# Patient Record
Sex: Male | Born: 1970 | Race: Black or African American | Hispanic: No | Marital: Married | Smoking: Never smoker
Health system: Southern US, Community
[De-identification: ages and names within clinical notes are randomized; demographics above are authoritative.]

## PROBLEM LIST (undated history)

## (undated) DIAGNOSIS — I639 Cerebral infarction, unspecified: Secondary | ICD-10-CM

## (undated) DIAGNOSIS — J45909 Unspecified asthma, uncomplicated: Secondary | ICD-10-CM

## (undated) DIAGNOSIS — G43909 Migraine, unspecified, not intractable, without status migrainosus: Secondary | ICD-10-CM

## (undated) DIAGNOSIS — B019 Varicella without complication: Secondary | ICD-10-CM

## (undated) DIAGNOSIS — J302 Other seasonal allergic rhinitis: Secondary | ICD-10-CM

## (undated) HISTORY — DX: Varicella without complication: B01.9

## (undated) HISTORY — DX: Migraine, unspecified, not intractable, without status migrainosus: G43.909

## (undated) HISTORY — DX: Other seasonal allergic rhinitis: J30.2

---

## 1998-05-16 ENCOUNTER — Emergency Department (HOSPITAL_COMMUNITY): Admission: EM | Admit: 1998-05-16 | Discharge: 1998-05-16 | Payer: Self-pay | Admitting: Emergency Medicine

## 2000-11-08 ENCOUNTER — Emergency Department (HOSPITAL_COMMUNITY): Admission: EM | Admit: 2000-11-08 | Discharge: 2000-11-08 | Payer: Self-pay | Admitting: Emergency Medicine

## 2000-11-09 ENCOUNTER — Ambulatory Visit (HOSPITAL_BASED_OUTPATIENT_CLINIC_OR_DEPARTMENT_OTHER): Admission: RE | Admit: 2000-11-09 | Discharge: 2000-11-09 | Payer: Self-pay | Admitting: *Deleted

## 2001-05-06 ENCOUNTER — Emergency Department (HOSPITAL_COMMUNITY): Admission: EM | Admit: 2001-05-06 | Discharge: 2001-05-06 | Payer: Self-pay | Admitting: *Deleted

## 2001-05-06 ENCOUNTER — Encounter: Payer: Self-pay | Admitting: *Deleted

## 2001-06-22 ENCOUNTER — Emergency Department (HOSPITAL_COMMUNITY): Admission: EM | Admit: 2001-06-22 | Discharge: 2001-06-22 | Payer: Self-pay | Admitting: Emergency Medicine

## 2001-07-20 ENCOUNTER — Emergency Department (HOSPITAL_COMMUNITY): Admission: EM | Admit: 2001-07-20 | Discharge: 2001-07-20 | Payer: Self-pay | Admitting: Emergency Medicine

## 2002-02-09 ENCOUNTER — Emergency Department (HOSPITAL_COMMUNITY): Admission: EM | Admit: 2002-02-09 | Discharge: 2002-02-09 | Payer: Self-pay | Admitting: Emergency Medicine

## 2002-02-09 ENCOUNTER — Encounter: Payer: Self-pay | Admitting: Emergency Medicine

## 2002-03-23 ENCOUNTER — Emergency Department (HOSPITAL_COMMUNITY): Admission: EM | Admit: 2002-03-23 | Discharge: 2002-03-23 | Payer: Self-pay | Admitting: Emergency Medicine

## 2002-03-23 ENCOUNTER — Encounter: Payer: Self-pay | Admitting: Emergency Medicine

## 2002-07-22 ENCOUNTER — Encounter: Payer: Self-pay | Admitting: Emergency Medicine

## 2002-07-22 ENCOUNTER — Emergency Department (HOSPITAL_COMMUNITY): Admission: EM | Admit: 2002-07-22 | Discharge: 2002-07-23 | Payer: Self-pay | Admitting: Emergency Medicine

## 2002-08-17 ENCOUNTER — Emergency Department (HOSPITAL_COMMUNITY): Admission: EM | Admit: 2002-08-17 | Discharge: 2002-08-17 | Payer: Self-pay | Admitting: Emergency Medicine

## 2002-09-02 ENCOUNTER — Emergency Department (HOSPITAL_COMMUNITY): Admission: EM | Admit: 2002-09-02 | Discharge: 2002-09-02 | Payer: Self-pay | Admitting: Emergency Medicine

## 2003-08-22 ENCOUNTER — Emergency Department (HOSPITAL_COMMUNITY): Admission: EM | Admit: 2003-08-22 | Discharge: 2003-08-22 | Payer: Self-pay | Admitting: Family Medicine

## 2003-11-22 ENCOUNTER — Emergency Department (HOSPITAL_COMMUNITY): Admission: EM | Admit: 2003-11-22 | Discharge: 2003-11-22 | Payer: Self-pay | Admitting: Emergency Medicine

## 2004-05-28 ENCOUNTER — Emergency Department (HOSPITAL_COMMUNITY): Admission: EM | Admit: 2004-05-28 | Discharge: 2004-05-28 | Payer: Self-pay | Admitting: Emergency Medicine

## 2004-11-06 ENCOUNTER — Emergency Department (HOSPITAL_COMMUNITY): Admission: EM | Admit: 2004-11-06 | Discharge: 2004-11-06 | Payer: Self-pay | Admitting: Family Medicine

## 2004-12-02 ENCOUNTER — Encounter: Admission: RE | Admit: 2004-12-02 | Discharge: 2004-12-02 | Payer: Self-pay | Admitting: Occupational Medicine

## 2005-08-23 ENCOUNTER — Emergency Department (HOSPITAL_COMMUNITY): Admission: EM | Admit: 2005-08-23 | Discharge: 2005-08-23 | Payer: Self-pay | Admitting: Family Medicine

## 2005-08-30 ENCOUNTER — Emergency Department (HOSPITAL_COMMUNITY): Admission: EM | Admit: 2005-08-30 | Discharge: 2005-08-30 | Payer: Self-pay | Admitting: Emergency Medicine

## 2006-01-14 ENCOUNTER — Emergency Department (HOSPITAL_COMMUNITY): Admission: EM | Admit: 2006-01-14 | Discharge: 2006-01-14 | Payer: Self-pay | Admitting: Emergency Medicine

## 2006-04-15 ENCOUNTER — Emergency Department (HOSPITAL_COMMUNITY): Admission: EM | Admit: 2006-04-15 | Discharge: 2006-04-15 | Payer: Self-pay | Admitting: *Deleted

## 2006-06-22 ENCOUNTER — Emergency Department (HOSPITAL_COMMUNITY): Admission: EM | Admit: 2006-06-22 | Discharge: 2006-06-22 | Payer: Self-pay | Admitting: Emergency Medicine

## 2006-10-11 ENCOUNTER — Ambulatory Visit: Payer: Self-pay | Admitting: Internal Medicine

## 2006-10-12 ENCOUNTER — Ambulatory Visit: Payer: Self-pay | Admitting: Internal Medicine

## 2006-12-04 ENCOUNTER — Emergency Department (HOSPITAL_COMMUNITY): Admission: EM | Admit: 2006-12-04 | Discharge: 2006-12-04 | Payer: Self-pay | Admitting: Emergency Medicine

## 2006-12-18 ENCOUNTER — Emergency Department (HOSPITAL_COMMUNITY): Admission: EM | Admit: 2006-12-18 | Discharge: 2006-12-18 | Payer: Self-pay | Admitting: Family Medicine

## 2007-02-11 ENCOUNTER — Inpatient Hospital Stay (HOSPITAL_COMMUNITY): Admission: EM | Admit: 2007-02-11 | Discharge: 2007-02-12 | Payer: Self-pay | Admitting: Emergency Medicine

## 2007-02-24 ENCOUNTER — Ambulatory Visit: Payer: Self-pay | Admitting: Internal Medicine

## 2007-02-24 LAB — CONVERTED CEMR LAB
Basophils Absolute: 0.1 10*3/uL (ref 0.0–0.1)
Basophils Relative: 1 % (ref 0–1)
Bilirubin, Direct: 0.3 mg/dL (ref 0.0–0.3)
Eosinophils Absolute: 0.1 10*3/uL (ref 0.0–0.7)
HCT: 42 % (ref 39.0–52.0)
Lymphs Abs: 1.2 10*3/uL (ref 0.7–3.3)
MCHC: 31.9 g/dL (ref 30.0–36.0)
MCV: 94.4 fL (ref 78.0–100.0)
Neutrophils Relative %: 51 % (ref 43–77)
Platelets: 243 10*3/uL (ref 150–400)
RDW: 12.7 % (ref 11.5–14.0)
Total Bilirubin: 1.7 mg/dL — ABNORMAL HIGH (ref 0.3–1.2)
Total Protein: 6.9 g/dL (ref 6.0–8.3)

## 2007-03-02 ENCOUNTER — Encounter (INDEPENDENT_AMBULATORY_CARE_PROVIDER_SITE_OTHER): Payer: Self-pay | Admitting: Internal Medicine

## 2007-03-02 LAB — CONVERTED CEMR LAB
HCV Ab: NEGATIVE
Hep B C IgM: NEGATIVE

## 2007-03-03 DIAGNOSIS — K2970 Gastritis, unspecified, without bleeding: Secondary | ICD-10-CM | POA: Insufficient documentation

## 2007-03-03 DIAGNOSIS — K2990 Gastroduodenitis, unspecified, without bleeding: Secondary | ICD-10-CM

## 2008-01-09 ENCOUNTER — Telehealth (INDEPENDENT_AMBULATORY_CARE_PROVIDER_SITE_OTHER): Payer: Self-pay | Admitting: Internal Medicine

## 2008-04-17 ENCOUNTER — Ambulatory Visit: Payer: Self-pay | Admitting: Internal Medicine

## 2008-05-02 ENCOUNTER — Ambulatory Visit: Payer: Self-pay | Admitting: Internal Medicine

## 2008-05-02 DIAGNOSIS — J309 Allergic rhinitis, unspecified: Secondary | ICD-10-CM | POA: Insufficient documentation

## 2008-05-02 DIAGNOSIS — J45909 Unspecified asthma, uncomplicated: Secondary | ICD-10-CM | POA: Insufficient documentation

## 2008-05-02 LAB — CONVERTED CEMR LAB
AST: 19 units/L (ref 0–37)
Alkaline Phosphatase: 69 units/L (ref 39–117)
Basophils Absolute: 0.1 10*3/uL (ref 0.0–0.1)
Bilirubin Urine: NEGATIVE
Blood in Urine, dipstick: NEGATIVE
Calcium: 9.1 mg/dL (ref 8.4–10.5)
Cholesterol: 142 mg/dL (ref 0–200)
Creatinine, Ser: 0.9 mg/dL (ref 0.40–1.50)
Eosinophils Absolute: 0.1 10*3/uL (ref 0.0–0.7)
Eosinophils Relative: 3 % (ref 0–5)
Glucose, Urine, Semiquant: NEGATIVE
HCT: 40.3 % (ref 39.0–52.0)
Hemoglobin: 13.1 g/dL (ref 13.0–17.0)
LDL Cholesterol: 60 mg/dL (ref 0–99)
Lymphocytes Relative: 29 % (ref 12–46)
Lymphs Abs: 1.7 10*3/uL (ref 0.7–4.0)
MCHC: 32.5 g/dL (ref 30.0–36.0)
Monocytes Absolute: 0.6 10*3/uL (ref 0.1–1.0)
Neutro Abs: 3.3 10*3/uL (ref 1.7–7.7)
RBC: 4.39 M/uL (ref 4.22–5.81)
WBC Urine, dipstick: NEGATIVE

## 2008-05-18 ENCOUNTER — Encounter (INDEPENDENT_AMBULATORY_CARE_PROVIDER_SITE_OTHER): Payer: Self-pay | Admitting: Internal Medicine

## 2008-05-22 ENCOUNTER — Encounter (INDEPENDENT_AMBULATORY_CARE_PROVIDER_SITE_OTHER): Payer: Self-pay | Admitting: Internal Medicine

## 2008-08-18 ENCOUNTER — Telehealth (INDEPENDENT_AMBULATORY_CARE_PROVIDER_SITE_OTHER): Payer: Self-pay | Admitting: Internal Medicine

## 2008-08-19 ENCOUNTER — Encounter (INDEPENDENT_AMBULATORY_CARE_PROVIDER_SITE_OTHER): Payer: Self-pay | Admitting: Internal Medicine

## 2008-10-24 ENCOUNTER — Emergency Department (HOSPITAL_COMMUNITY): Admission: EM | Admit: 2008-10-24 | Discharge: 2008-10-24 | Payer: Self-pay | Admitting: Emergency Medicine

## 2010-04-15 ENCOUNTER — Emergency Department (HOSPITAL_COMMUNITY): Admission: EM | Admit: 2010-04-15 | Discharge: 2010-04-15 | Payer: Self-pay | Admitting: Emergency Medicine

## 2010-06-19 ENCOUNTER — Emergency Department (HOSPITAL_COMMUNITY)
Admission: EM | Admit: 2010-06-19 | Discharge: 2010-06-19 | Payer: Self-pay | Source: Home / Self Care | Admitting: Family Medicine

## 2010-11-17 NOTE — Discharge Summary (Signed)
NAME:  Scott Glenn, Scott Glenn             ACCOUNT NO.:  [id number]   MEDICAL RECORD NO.:  [id number]          PATIENT TYPE:  INP   LOCATION:  4715                         FACILITY:  [facility]   PHYSICIAN:  Jason Coop, MD DATE OF BIRTH:  08/22/1970   DATE OF ADMISSION:  02/11/2007  DATE OF DISCHARGE:  02/12/2007                               DISCHARGE SUMMARY   DISCHARGE DIAGNOSES:  1. Alcoholic and nonsteroidal antiinflammatory drug gastritis.  2. Gilbert's syndrome.   DISCHARGE MEDICATIONS:  1. Omeprazole 40 mg p.o. b.i.d. for 2 weeks.  2. Percocet 10/325, 1-2 pills p.r.n. q.i.d.   DISCHARGE PLAN AND FOLLOW UP:  The patient is discharged home.  Because  today is the weekend, I will get an appointment for him in the [facility] tomorrow and I will call him.  His primary care  physician should follow him for any persisting symptoms of gastritis, as  well as his bilirubin and liver enzymes.  Please refer him for EGD if  pain persists on follow up.   PROCEDURES:  1. Acute abdominal series dated February 11, 2007 is negative for free      gas under the diaphgram and negative for cardiopulmonary disease.  2. CT chest is needed for pulmonary emboli and findings compatible      with asthma or chronic bronchitic changes.  3. Ultrasound of abdomen is negative for any significant finding.   CONSULTATIONS:  None.   HISTORY OF PRESENT ILLNESS:  This is a 40 year old African American man  with past medical history of asthma.  He came in for chest pain on left  lower chest for two days.  It is 8/10 when intense, increased by deep  breaths and cough, and decreases by putting hands over the chest.  It is  non-radiating.  He has some shortness of breath.  He has some subjective  fever and when measured it was 99.5 one day prior to admission.  He also  had some cough for the last 1 or 2 weeks with some brownish and green  sputum in the beginning and later it turned colorless.  He also  had some  chills and sweating. He also had 1-2 episodes of vomiting without blood.  He also had one loose stool on the day of admission without any blood or  mucus.  He had broken his left 3rd, 4th and 5th metacarpals two months  back and has been on some ibuprofen, as well as Naproxen, about 2  tablets 2-3 times a day.  He was not taking any gastro-protective  medications like PPI or H2 blockers.   PHYSICAL EXAMINATION:  VITAL SIGNS:  Temperature 98.7, blood pressure  147/94, pulse 82, respirations 28, saturations 96% on room air.  GENERAL:  Putting hands on left lower chest.  HEENT:  No icterus.  Oropharynx moist.  NECK:  No mass in neck and neck is supple.  CHEST:  Clear to auscultation bilaterally.  There is some tenderness  over the left costal margin.  HEART:  1st and 2nd heart sounds normal.  Regular rate and rhythm.  No  rubs, murmurs  or gallops.  ABDOMEN:  Bowel sounds normal, tenderness on epigastrium and left upper  quadrant.  There was mild localized guarding on the epigastrium, which  is on a voluntary nature and there is no rebound.   LABORATORY ON ADMISSION:  WBC 5.7 with HCT 4.4, hemoglobin 14.1,  platelets 261, MCV 90.7, RDW 13.  Sodium 134, potassium 4.1, chloride  97, bicarb 23, BUN 12, creatinine 0.98, glucose 67, bilirubin 4.9, alk-  phos 89, SGOT 45, GPT 28, protein 7.4, albumin 4.6, calcium 9.1, D-dimer  0.63, lipase 25.   EKG - RSR prime pattern in VL and T inversion in VL.  EKG on V1 to V6  not clear.  Chest x-ray - no acute findings.  CT chest no pulmonary  embolus.   HOSPITAL COURSE:  Chest and upper abdominal pain.  A viscus perforation  was ruled out by acute abdominal series and serial abdominal  examination, which were benign.  PE and dissecting aneurysm was ruled  out by CT scan.  Pneumonia was ruled out by x-ray, vital signs, WBC and  chest auscultation.  ACS was ruled out by getting serial cardiac  enzymes, which were negative all the times.   Pneumothorax was ruled out  by a chest x-ray.  Hemoccult blood in stool was negative.  He was  treated with Protonix, rehydrated with IV fluids, morphine and  antiemetic with Zofran.  Subsequent labs was positive for a bilirubin of  4.9 and on subsequent examination, it was 3 with direct bilirubin of  0.4.  His hemoglobin at presentation was 14.1.  So a rapid hemolysis  with hyperbilirubinemia was less likely.  Just to confirm our  supposition, we did an LDH, which was normal at 123.  Ultrasound was  negative for any evidence of obstruction.  His liver enzymes were  normal. So we think his hyperbilirubinemia is most likely due to  Gilbert's syndrome.   DISCHARGE LABS:  WBC 4.4, hemoglobin 12.1.  The drop in hemoglobin is  discerned to hemodilution, occult fecal blood negative.  Sodium 138,  potassium 3.4, chloride 101, bicarb 26, BUN 10, creatinine 1.1, glucose  93, total bilirubin 3, direct bili 0.4, alk-phos 76, AST 37, ALT 23,  protein 6, albumin 3.8, calcium 8.6.   Vitals on discharge, temperature 98, blood pressure 135/73, pulse 53,  respirations 16, saturation 92% on room air.   CONDITION ON DISCHARGE:  Very mild abdominal pain.  No nausea or  vomiting.  He is getting back his appetite.      Jason Coop, MD  Electronically Signed     YP/MEDQ  D:  02/12/2007  T:  02/12/2007  Job:  [id number]

## 2010-11-20 NOTE — Op Note (Signed)
[name]  Patient:    Scott Glenn, Scott Glenn                    MRN: [id number] Proc. Date: 11/09/00 Adm. Date:  [id number] Disc. Date: [id number] Attending:  Annamarie Dawley                           Operative Report  PREOPERATIVE DIAGNOSIS:  Laceration of both flexor tendons, both digital nerves and one digital artery, left ring finger.  POSTOPERATIVE DIAGNOSIS:  Laceration of both flexor tendons, both digital nerves and one digital artery, left ring finger.  Laceration of both digital arteries.  OPERATION PERFORMED:  Repair of flexor digitorum profundus and flexor digitorum superficialis tendons with repair of radial digital nerve and artery and ulnar digital nerve and artery, left ring finger.  SURGEON:  Lowell Bouton, M.D.  ANESTHESIA:  General.  OPERATIVE FINDINGS:  The patient had the complete transection of both flexor tendons at just proximal to the PIP joint.  The digital arteries and nerves were both transected.  There was circulation to the digit.  DESCRIPTION OF PROCEDURE:  Under general anesthesia with a tourniquet on the left arm, the left hand was prepped and draped in the usual fashion and after elevating the limb, the tourniquet was inflated to 225 mmHg.  The previous sutures were removed and the laceration was extended proximally and distally in a zigzag fashion.  Blunt dissection was carried down to the flexor sheath and the distal ends of the tendons were present at the level of the laceration.  The proximal tendons were retrieved with a Carroll tendon passer and a 25 gauge needle was placed in the sheath to hold the tendons out to length.  The flexor tendons were repaired with 3-0 Ethibond suture in a Kessler fashion and the superficialis tendon and then Kessler fashion in the profundus tendon reinforced with a simple horizontal mattress core suture of 3-0 Ethibond.  The edges of the tendon were tidied with a 5-0  nylon suture. The flexor sheath was closed with 5-0 nylon suture to allow good gliding of the tendons.  The microscope was then brought in and the radial digital artery and nerve were cleaned with a microinstrument.  Adventitia was removed from the digital artery and the ends of the artery were cut back with the scissors. ____________ solution was used and a dilator and the vessel was placed in a clamp.  9-0 nylon suture was used for the artery repair and the clamp was removed with flow through the vessel despite the tourniquet being up.  The radial digital nerve was then trimmed with scissors and 9-0 sharp point suture was used for an epineural repair under the microscope.  The ulnar digital artery and nerve were likewise repaired in a similar fashion after releasing the tourniquet and reinflating it after five minutes.  The tourniquet was then released after all the vessels and nerves were repaired.  There was good flow to the digit and good flow through the vessels.  There was no inadvertent bleeding.  The wound was irrigated copiously with saline and the skin was closed with 4-0 nylon suture.  Sterile dressings were applied followed by a dorsal splint in a protective position.  The patient tolerated the procedure well and went to the recovery room awake and stable in good condition. DD:  11/09/00 TD:  11/10/00 Job: [id number]

## 2011-04-19 LAB — CBC
HCT: 35.3 — ABNORMAL LOW
HCT: 36.7 — ABNORMAL LOW
HCT: 40.9
Hemoglobin: 12.1 — ABNORMAL LOW
Hemoglobin: 14.1
MCHC: 34.2
MCHC: 34.5
MCHC: 34.7
MCV: 90.7
MCV: 92
Platelets: 218
Platelets: 261
RBC: 3.84 — ABNORMAL LOW
RBC: 4.04 — ABNORMAL LOW
RBC: 4.51
RDW: 13
RDW: 13.3
RDW: 13.6
WBC: 4.4
WBC: 5.7

## 2011-04-19 LAB — COMPREHENSIVE METABOLIC PANEL WITH GFR
ALT: 23
ALT: 28
AST: 45 — ABNORMAL HIGH
Albumin: 3.8
Albumin: 4.6
Alkaline Phosphatase: 76
Alkaline Phosphatase: 89
BUN: 10
BUN: 12
CO2: 26
Calcium: 9.1
Chloride: 101
Creatinine, Ser: 0.98
GFR calc Af Amer: >60
GFR calc non Af Amer: >60
Glucose, Bld: 67 — ABNORMAL LOW
Potassium: 4.1
Total Bilirubin: 4.9 — ABNORMAL HIGH
Total Protein: 6

## 2011-04-19 LAB — COMPREHENSIVE METABOLIC PANEL
AST: 37
CO2: 23
Calcium: 8.6
Chloride: 97
Creatinine, Ser: 1.01
GFR calc Af Amer: >60
GFR calc non Af Amer: >60
Glucose, Bld: 93
Potassium: 3.4 — ABNORMAL LOW
Sodium: 134 — ABNORMAL LOW
Sodium: 138
Total Bilirubin: 3 — ABNORMAL HIGH
Total Protein: 7.4

## 2011-04-19 LAB — DIFFERENTIAL
Basophils Absolute: 0
Basophils Absolute: 0
Basophils Relative: 1
Eosinophils Absolute: 0
Eosinophils Relative: 1
Eosinophils Relative: 2
Lymphocytes Relative: 11 — ABNORMAL LOW
Lymphocytes Relative: 24
Lymphs Abs: 0.6 — ABNORMAL LOW
Monocytes Absolute: 0.6
Monocytes Absolute: 0.6
Monocytes Relative: 10
Neutro Abs: 4.4
Neutrophils Relative %: 78 — ABNORMAL HIGH

## 2011-04-19 LAB — CK TOTAL AND CKMB (NOT AT ARMC): CK, MB: 2.5

## 2011-04-19 LAB — LIPASE, BLOOD: Lipase: 25

## 2011-04-19 LAB — CARDIAC PANEL(CRET KIN+CKTOT+MB+TROPI)
CK, MB: 2
CK, MB: 2.5
Relative Index: 0.7
Relative Index: 0.9
Total CK: 270 — ABNORMAL HIGH
Total CK: 278 — ABNORMAL HIGH
Troponin I: 0.01
Troponin I: 0.01

## 2011-04-19 LAB — OCCULT BLOOD X 1 CARD TO LAB, STOOL: Fecal Occult Bld: NEGATIVE

## 2011-04-19 LAB — SAMPLE TO BLOOD BANK

## 2011-04-19 LAB — D-DIMER, QUANTITATIVE: D-Dimer, Quant: 0.63 — ABNORMAL HIGH

## 2011-04-19 LAB — BILIRUBIN, DIRECT: Bilirubin, Direct: 0.4 — ABNORMAL HIGH

## 2012-02-18 ENCOUNTER — Emergency Department (HOSPITAL_COMMUNITY)
Admission: EM | Admit: 2012-02-18 | Discharge: 2012-02-18 | Disposition: A | Discharge status: Home / Self Care | Payer: Medicaid Other | Attending: Emergency Medicine | Admitting: Emergency Medicine

## 2012-02-18 ENCOUNTER — Encounter (HOSPITAL_COMMUNITY): Payer: Self-pay | Admitting: Emergency Medicine

## 2012-02-18 DIAGNOSIS — K047 Periapical abscess without sinus: Secondary | ICD-10-CM

## 2012-02-18 DIAGNOSIS — K089 Disorder of teeth and supporting structures, unspecified: Secondary | ICD-10-CM | POA: Insufficient documentation

## 2012-02-18 MED ORDER — IBUPROFEN 800 MG PO TABS
800.0000 mg | ORAL_TABLET | Freq: Once | ORAL | Status: AC
  Administered 2012-02-18: 800 mg via ORAL
  Filled 2012-02-18: qty 1

## 2012-02-18 MED ORDER — AMOXICILLIN 500 MG PO CAPS: 500.0000 mg | ORAL_CAPSULE | Freq: Three times a day (TID) | ORAL | Status: AC

## 2012-02-18 MED ORDER — HYDROCODONE-ACETAMINOPHEN 5-500 MG PO TABS: 1.0000 | ORAL_TABLET | Freq: Four times a day (QID) | ORAL | Status: AC | PRN

## 2012-02-18 MED ORDER — AMOXICILLIN 500 MG PO CAPS
500.0000 mg | ORAL_CAPSULE | Freq: Once | ORAL | Status: AC
  Administered 2012-02-18: 500 mg via ORAL
  Filled 2012-02-18: qty 1

## 2012-02-18 NOTE — ED Notes (Signed)
PT. REPORTS PROGRESSING LEFT UPPER MOLAR PAIN WITH SWELLING FOR 3 DAYS UNRELIEVED BY OTC PAIN MEDICATIONS.

## 2012-02-18 NOTE — ED Notes (Signed)
Pt c/o Left upper tooth pain for the past 2-3 days.  Abscess noted at left upper molar.  Denies fever.  Rates pain 10/10  Not relieved by ibuprofen

## 2012-02-18 NOTE — ED Provider Notes (Signed)
History     CSN: [id number]  Arrival date & time 02/18/12  0446   First MD Initiated Contact with Patient 02/18/12 0505      Chief Complaint  Patient presents with  . Dental Pain    (Consider location/radiation/quality/duration/timing/severity/associated sxs/prior treatment) Patient is a 41 y.o. male presenting with tooth pain. The history is provided by the patient.  Dental PainPrimary symptoms do not include headaches, fever or sore throat.  pt c/o left upper dental pain/swelling for the last 3 days. Constant. Dull. Non radiating. Worse w eating, palp area. No injury. States tooth draining small amt pus. No facial redness/swelling. No pain/swelling to throat, neck or floor of mouth. No fever or chills. Has no local dentist.   History reviewed. No pertinent past medical history.  History reviewed. No pertinent past surgical history.  No family history on file.  History  Substance Use Topics  . Smoking status: Never Smoker   . Smokeless tobacco: Not on file  . Alcohol Use: No      Review of Systems  Constitutional: Negative for fever and chills.  HENT: Negative for sore throat and neck pain.   Neurological: Negative for headaches.    Allergies  Review of patient's allergies indicates no known allergies.  Home Medications   Current Outpatient Rx  Name Route Sig Dispense Refill  . BC HEADACHE POWDER PO Oral Take by mouth every 8 (eight) hours as needed. For pain    . IBUPROFEN 200 MG PO TABS Oral Take 200 mg by mouth every 6 (six) hours as needed. For pain      BP 132/101  Pulse 56  Temp 98.8 F (37.1 C) (Oral)  Resp 18  SpO2 99%  Physical Exam  Nursing note and vitals reviewed. Constitutional: He is oriented to person, place, and time. He appears well-developed and well-nourished. No distress.  HENT:  Head: Atraumatic.  Nose: Nose normal.  Mouth/Throat: Oropharynx is clear and moist.       Left upper dental decay/tenderness/dental abscess. No trismus.  No pharyngeal, neck or floor of mouth pain, swelling, or tenderness. No trismus.   Eyes: Conjunctivae are normal.  Neck: Neck supple. No tracheal deviation present.  Cardiovascular: Normal rate.   Pulmonary/Chest: Effort normal. No accessory muscle usage. No respiratory distress.  Musculoskeletal: Normal range of motion.  Lymphadenopathy:    He has no cervical adenopathy.  Neurological: He is alert and oriented to person, place, and time.  Skin: Skin is warm and dry.  Psychiatric: He has a normal mood and affect.    ED Course  Procedures (including critical care time)    MDM  Confirmed nkda w pt. Pt drove self to ed. Motrin po. amox po. Has no local dentist, will refer to call office this morning.            Suzi Roots, MD 02/18/12 [phone]

## 2013-10-03 ENCOUNTER — Encounter (HOSPITAL_COMMUNITY): Payer: Self-pay | Admitting: Emergency Medicine

## 2013-10-03 ENCOUNTER — Emergency Department (HOSPITAL_COMMUNITY): Payer: Worker's Compensation

## 2013-10-03 ENCOUNTER — Emergency Department (HOSPITAL_COMMUNITY)
Admission: EM | Admit: 2013-10-03 | Discharge: 2013-10-03 | Disposition: A | Discharge status: Home / Self Care | Payer: Worker's Compensation | Attending: Emergency Medicine | Admitting: Emergency Medicine

## 2013-10-03 DIAGNOSIS — M25561 Pain in right knee: Secondary | ICD-10-CM

## 2013-10-03 DIAGNOSIS — J45909 Unspecified asthma, uncomplicated: Secondary | ICD-10-CM | POA: Insufficient documentation

## 2013-10-03 DIAGNOSIS — S8990XA Unspecified injury of unspecified lower leg, initial encounter: Secondary | ICD-10-CM | POA: Insufficient documentation

## 2013-10-03 DIAGNOSIS — Y990 Civilian activity done for income or pay: Secondary | ICD-10-CM | POA: Insufficient documentation

## 2013-10-03 DIAGNOSIS — Y929 Unspecified place or not applicable: Secondary | ICD-10-CM | POA: Insufficient documentation

## 2013-10-03 DIAGNOSIS — S99929A Unspecified injury of unspecified foot, initial encounter: Principal | ICD-10-CM

## 2013-10-03 DIAGNOSIS — Y9389 Activity, other specified: Secondary | ICD-10-CM | POA: Insufficient documentation

## 2013-10-03 DIAGNOSIS — S99919A Unspecified injury of unspecified ankle, initial encounter: Principal | ICD-10-CM

## 2013-10-03 DIAGNOSIS — X500XXA Overexertion from strenuous movement or load, initial encounter: Secondary | ICD-10-CM | POA: Insufficient documentation

## 2013-10-03 HISTORY — DX: Unspecified asthma, uncomplicated: J45.909

## 2013-10-03 MED ORDER — IBUPROFEN 400 MG PO TABS
800.0000 mg | ORAL_TABLET | Freq: Once | ORAL | Status: AC
  Administered 2013-10-03: 800 mg via ORAL
  Filled 2013-10-03: qty 2

## 2013-10-03 MED ORDER — HYDROCODONE-ACETAMINOPHEN 5-325 MG PO TABS: 1.0000 | ORAL_TABLET | ORAL | Status: DC | PRN

## 2013-10-03 MED ORDER — IBUPROFEN 800 MG PO TABS: 800.0000 mg | ORAL_TABLET | Freq: Three times a day (TID) | ORAL | Status: DC | PRN

## 2013-10-03 NOTE — Discharge Instructions (Signed)
Read the information below.  Use the prescribed medication as directed.  Please discuss all new medications with your pharmacist.  Do not take additional tylenol while taking the prescribed pain medication to avoid overdose.  You may return to the Emergency Department at any time for worsening condition or any new symptoms that concern you.  If you develop uncontrolled pain, weakness or numbness of the extremity, severe discoloration of the skin, or you are unable to walk, return to the ER for a recheck.    ° ° °Knee Pain °Knee pain can be a result of an injury or other medical conditions. Treatment will depend on the cause of your pain. °HOME CARE °· Only take medicine as told by your doctor. °· Keep a healthy weight. Being overweight can make the knee hurt more. °· Stretch before exercising or playing sports. °· If there is constant knee pain, change the way you exercise. Ask your doctor for advice. °· Make sure shoes fit well. Choose the right shoe for the sport or activity. °· Protect your knees. Wear kneepads if needed. °· Rest when you are tired. °GET HELP RIGHT AWAY IF:  °· Your knee pain does not stop. °· Your knee pain does not get better. °· Your knee joint feels hot to the touch. °· You have a fever. °MAKE SURE YOU:  °· Understand these instructions. °· Will watch this condition. °· Will get help right away if you are not doing well or get worse. °Document Released: 09/17/2008 Document Revised: 09/13/2011 Document Reviewed: 09/17/2008 °ExitCare® Patient Information ©2014 [employer]. ° °

## 2013-10-03 NOTE — ED Provider Notes (Signed)
CSN: [id number]     Arrival date & time 10/03/13  1240 History   First MD Initiated Contact with Patient 10/03/13 1328    This chart was scribed for [facility] PA-C, a non-physician practitioner working with Mervin Kung, MD by Denice Bors, ED Scribe. This patient was seen in room TR06C/TR06C and the patient's care was started at 2:20 PM      Chief Complaint  Patient presents with  . Knee Pain     (Consider location/radiation/quality/duration/timing/severity/associated sxs/prior Treatment) The history is provided by the patient. No language interpreter was used.   HPI Comments: Scott Glenn is a 43 y.o. male who presents to the Emergency Department complaining of constant moderate right knee pain onset PTA while carrying furniture at work "heard knee pop" while right foot was planted going sideways down the stairs. Describes pain as throbbing and 6/10 pain in severity. States pain is exacerbated by weight bearing and twisting. Denies any alleviating factors. Denies associated numbness, other injuries, and fall.   Past Medical History  Diagnosis Date  . Asthma    No past surgical history on file. No family history on file. History  Substance Use Topics  . Smoking status: Never Smoker   . Smokeless tobacco: Not on file  . Alcohol Use: No    Review of Systems  Constitutional: Negative for fever.  Musculoskeletal: Positive for arthralgias.  Neurological: Negative for numbness.  Psychiatric/Behavioral: Negative for confusion.      Allergies  Review of patient's allergies indicates no known allergies.  Home Medications   Current Outpatient Rx  Name  Route  Sig  Dispense  Refill  . Aspirin-Salicylamide-Caffeine (BC HEADACHE POWDER PO)   Oral   Take by mouth every 8 (eight) hours as needed. For pain         . ibuprofen (ADVIL,MOTRIN) 200 MG tablet   Oral   Take 200 mg by mouth every 6 (six) hours as needed. For pain          BP 130/92  Pulse 66   Temp(Src) 97.9 F (36.6 C) (Oral)  Resp 18  Ht 5\' 9"  (1.753 m)  Wt 185 lb (83.915 kg)  BMI 27.31 kg/m2  SpO2 97% Physical Exam  Nursing note and vitals reviewed. Constitutional: He is oriented to person, place, and time. He appears well-developed and well-nourished. No distress.  HENT:  Head: Normocephalic and atraumatic.  Eyes: EOM are normal.  Neck: Neck supple. No tracheal deviation present.  Cardiovascular: Normal rate, intact distal pulses and normal pulses.   Pulses:      Dorsalis pedis pulses are 2+ on the right side.       Posterior tibial pulses are 2+ on the right side.  Pulmonary/Chest: Effort normal. No respiratory distress.  Musculoskeletal: Normal range of motion.   Right knee: Distal sensation is intact. Pain with stress in all directions, but no laxity.   Cannot perform Thessaly test secondary to pain.   Sensation is intact.   Normal right ankle.   Neurological: He is alert and oriented to person, place, and time.  Skin: Skin is warm and dry.  Psychiatric: He has a normal mood and affect. His behavior is normal.    ED Course  Procedures (including critical care time) COORDINATION OF CARE:  Nursing notes reviewed. Vital signs reviewed. Initial pt interview and examination performed.   1:55 PM-Discussed work up plan with pt at bedside, which includes  Orders Placed This Encounter  Procedures  . DG Knee  Complete 4 Views Right    Standing Status: Standing     Number of Occurrences: 1     Standing Expiration Date:     Order Specific Question:  Reason for exam:    Answer:  KNEE PAIN  . Pt agrees with plan.   Treatment plan initiated:Medications - No data to display   Initial diagnostic testing ordered.    Labs Review Labs Reviewed - No data to display Imaging Review Dg Knee Complete 4 Views Right  10/03/2013   EXAM: RIGHT KNEE - COMPLETE 4+ VIEW  COMPARISON:  None.  FINDINGS: There is no evidence of fracture, dislocation, or joint effusion. There  is no evidence of arthropathy or other focal bone abnormality. Soft tissues are unremarkable.  IMPRESSION: Negative.   Electronically Signed   By: Misty Stanley M.D.   On: 10/03/2013 13:59   2:21 PM Nursing Notes Reviewed/ Care Coordinated Applicable Imaging Reviewed and incorporated into ED treatment Discussed results and treatment plan with pt. Pt demonstrates understanding and agrees with plan.    EKG Interpretation None      MDM   Final diagnoses:  Right knee pain   Pt with right knee injury while carrying furniture down the stairs.  Unclear where injury is on exam though suspect meniscal injury.  No swelling, xray negative.  Joint is stable.  Neurovascularly intact. Discussed result, findings, treatment, and follow up  with patient.  Pt given return precautions.  Pt verbalizes understanding and agrees with plan.       I personally performed the services described in this documentation, which was scribed in my presence. The recorded information has been reviewed and is accurate.     Clayton Bibles, PA-C 10/03/13 1513

## 2013-10-03 NOTE — ED Notes (Signed)
Was carrying furniture at work and felt  Rt knee pop hurts to walk on it done today

## 2013-10-05 NOTE — ED Provider Notes (Signed)
Medical screening examination/treatment/procedure(s) were performed by non-physician practitioner and as supervising physician I was immediately available for consultation/collaboration.   Houston Siren III, MD 10/05/13 [phone]

## 2014-08-27 ENCOUNTER — Encounter (HOSPITAL_COMMUNITY): Payer: Self-pay | Admitting: Family Medicine

## 2014-08-27 ENCOUNTER — Emergency Department (INDEPENDENT_AMBULATORY_CARE_PROVIDER_SITE_OTHER)
Admission: EM | Admit: 2014-08-27 | Discharge: 2014-08-27 | Disposition: A | Discharge status: Home / Self Care | Payer: Self-pay | Source: Home / Self Care | Attending: Family Medicine | Admitting: Family Medicine

## 2014-08-27 DIAGNOSIS — K047 Periapical abscess without sinus: Secondary | ICD-10-CM

## 2014-08-27 DIAGNOSIS — I10 Essential (primary) hypertension: Secondary | ICD-10-CM

## 2014-08-27 MED ORDER — BUPIVACAINE-EPINEPHRINE (PF) 0.5% -1:200000 IJ SOLN
INTRAMUSCULAR | Status: AC
  Filled 2014-08-27: qty 1.8

## 2014-08-27 MED ORDER — IBUPROFEN 800 MG PO TABS
ORAL_TABLET | ORAL | Status: AC
  Filled 2014-08-27: qty 1

## 2014-08-27 MED ORDER — TRAMADOL HCL 50 MG PO TABS: 50.0000 mg | ORAL_TABLET | Freq: Four times a day (QID) | ORAL | Status: DC | PRN

## 2014-08-27 MED ORDER — CLINDAMYCIN HCL 300 MG PO CAPS: 300.0000 mg | ORAL_CAPSULE | Freq: Three times a day (TID) | ORAL | Status: DC

## 2014-08-27 MED ORDER — IBUPROFEN 800 MG PO TABS
800.0000 mg | ORAL_TABLET | Freq: Once | ORAL | Status: AC
  Administered 2014-08-27: 800 mg via ORAL

## 2014-08-27 NOTE — Discharge Instructions (Signed)
Haldol infection and a chipped wisdom tooth. This will likely need to be pulled. He was given a dental nerve shot which should keep the area completely numb for several hours. You're also given ibuprofen 800 mg which also helped significantly with the pain. Please use the tramadol after you get home for additional pain relief. This may make you sleepy. Please take the clindamycin for the full 10 days. Please take a daily probiotic or some yogurt regularly to help prevent diarrhea.

## 2014-08-27 NOTE — ED Notes (Signed)
C/o  Sore throat and dental pain.  On set Sunday.  Denies fever, n/v/d.  No relief with otc meds.

## 2014-08-27 NOTE — ED Provider Notes (Addendum)
CSN: [id number]     Arrival date & time 08/27/14  1314 History   First MD Initiated Contact with Patient 08/27/14 1408     Chief Complaint  Patient presents with  . Dental Pain  . Sore Throat   (Consider location/radiation/quality/duration/timing/severity/associated sxs/prior Treatment) HPI  R tooth pain. Started 3 days ago. Constant and getting worse. Difficult to eat or drink hot or cold liquids. Denies any foul taste, blood. BC powder poured directly onto painful location w/ worsening pain. Oragel w/ improvement. Denies facial swelling or neck stiffness. R lower jaw -  "last wisdom tooth." Unsure if filling fell out as pt grinds teeth at night.    Past Medical History  Diagnosis Date  . Asthma    History reviewed. No pertinent past surgical history. Family History  Problem Relation Age of Onset  . Hypertension Mother   . Stroke Father    History  Substance Use Topics  . Smoking status: Never Smoker   . Smokeless tobacco: Not on file  . Alcohol Use: No    Review of Systems Per HPI with all other pertinent systems negative.   Allergies  Banana and Pork-derived products  Home Medications   Prior to Admission medications   Medication Sig Start Date End Date Taking? Authorizing Provider  Aspirin-Salicylamide-Caffeine (BC HEADACHE POWDER PO) Take by mouth every 8 (eight) hours as needed. For pain    Historical Provider, MD  benzocaine (ORAJEL) 10 % mucosal gel Use as directed 1 application in the mouth or throat as needed for mouth pain.    Historical Provider, MD  cetirizine (ZYRTEC) 10 MG tablet Take 10 mg by mouth daily.    Historical Provider, MD  clindamycin (CLEOCIN) 300 MG capsule Take 1 capsule (300 mg total) by mouth 3 (three) times daily. 08/27/14   Waldemar Dickens, MD  OVER THE COUNTER MEDICATION Take 1 tablet by mouth at bedtime as needed (for sleep. OTC Sleep Aid).    Historical Provider, MD  traMADol (ULTRAM) 50 MG tablet Take 1 tablet (50 mg total) by mouth  every 6 (six) hours as needed. 08/27/14   Waldemar Dickens, MD   BP 120/100 mmHg  Pulse 62  Temp(Src) 98.1 F (36.7 C) (Oral)  Resp 16  SpO2 100% Physical Exam  Constitutional: He is oriented to person, place, and time. He appears well-developed and well-nourished.  HENT:  Right mandibular wisdom tooth with chipped out lateral anterior aspect and signs of previous filling. Surrounding gums with minimal swelling but exquisite tenderness. No fluctuance. No induration.   Eyes: EOM are normal. Pupils are equal, round, and reactive to light.  Neck: Normal range of motion. Neck supple.  Cardiovascular: Normal rate.   No murmur heard. Pulmonary/Chest: Effort normal and breath sounds normal.  Abdominal: Soft. Bowel sounds are normal.  Musculoskeletal: Normal range of motion. He exhibits no edema or tenderness.  Lymphadenopathy:    He has no cervical adenopathy.  Neurological: He is alert and oriented to person, place, and time.  Skin: Skin is dry.  Psychiatric: His behavior is normal. Judgment and thought content normal.    ED Course  Dental Date/Time: 08/27/2014 2:34 PM Performed by: Marily Memos, DAVID J Authorized by: Marily Memos, DAVID J   after obtaining verbal consent. 1.8 mL of Marcaine with epinephrine was used to anesthetize the right mandibular wisdom tooth region. Patient tolerated the procedure well. No competition noted.   (including critical care time) Labs Review Labs Reviewed - No data to display  Imaging Review  No results found.   MDM   1. Dental infection   2. Essential hypertension    Dental block performed with resolution of dental pain, as above. Motrin 800 mg by mouth given. Prescription for clindamycin and tramadol given. Patient to follow-up with dentist for repair versus extraction. Patient to use a daily mouthwash.  Hypertension: Patient did be hypertensive. Patient without history of hypertension. Patient to follow-up with PCP for further  management.  Precautions given and all questions answered  Linna Darner, MD Family Medicine 08/27/2014, 2:36 PM      Waldemar Dickens, MD 08/27/14 1436  Waldemar Dickens, MD 08/27/14 [phone]

## 2014-12-13 ENCOUNTER — Encounter (HOSPITAL_COMMUNITY): Payer: Self-pay | Admitting: *Deleted

## 2014-12-13 ENCOUNTER — Emergency Department (HOSPITAL_COMMUNITY)
Admission: EM | Admit: 2014-12-13 | Discharge: 2014-12-13 | Disposition: A | Discharge status: Home / Self Care | Payer: Self-pay | Attending: Emergency Medicine | Admitting: Emergency Medicine

## 2014-12-13 DIAGNOSIS — J45909 Unspecified asthma, uncomplicated: Secondary | ICD-10-CM | POA: Insufficient documentation

## 2014-12-13 DIAGNOSIS — K088 Other specified disorders of teeth and supporting structures: Secondary | ICD-10-CM | POA: Insufficient documentation

## 2014-12-13 DIAGNOSIS — Z79899 Other long term (current) drug therapy: Secondary | ICD-10-CM | POA: Insufficient documentation

## 2014-12-13 DIAGNOSIS — Z792 Long term (current) use of antibiotics: Secondary | ICD-10-CM | POA: Insufficient documentation

## 2014-12-13 DIAGNOSIS — K029 Dental caries, unspecified: Secondary | ICD-10-CM | POA: Insufficient documentation

## 2014-12-13 MED ORDER — OXYCODONE-ACETAMINOPHEN 5-325 MG PO TABS
2.0000 | ORAL_TABLET | Freq: Once | ORAL | Status: AC
  Administered 2014-12-13: 2 via ORAL
  Filled 2014-12-13: qty 2

## 2014-12-13 MED ORDER — AMOXICILLIN 500 MG PO CAPS
500.0000 mg | ORAL_CAPSULE | Freq: Once | ORAL | Status: AC
  Administered 2014-12-13: 500 mg via ORAL
  Filled 2014-12-13: qty 1

## 2014-12-13 MED ORDER — AMOXICILLIN 500 MG PO CAPS: 500.0000 mg | ORAL_CAPSULE | Freq: Three times a day (TID) | ORAL | Status: DC

## 2014-12-13 MED ORDER — OXYCODONE-ACETAMINOPHEN 5-325 MG PO TABS: 2.0000 | ORAL_TABLET | ORAL | Status: DC | PRN

## 2014-12-13 NOTE — Discharge Instructions (Signed)
Dental Caries Follow-up with Letitia Libra Civil, dentist for further evaluation of your dental caries. Take amoxicillin as prescribed. Take Percocet for breakthrough pain. Dental caries is tooth decay. This decay can cause a hole in teeth (cavity) that can get bigger and deeper over time. HOME CARE  Brush and floss your teeth. Do this at least two times a day.  Use a fluoride toothpaste.  Use a mouth rinse if told by your dentist or doctor.  Eat less sugary and starchy foods. Drink less sugary drinks.  Avoid snacking often on sugary and starchy foods. Avoid sipping often on sugary drinks.  Keep regular checkups and cleanings with your dentist.  Use fluoride supplements if told by your dentist or doctor.  Allow fluoride to be applied to teeth if told by your dentist or doctor. Document Released: 03/30/2008 Document Revised: 11/05/2013 Document Reviewed: 06/23/2012 [facility] Patient Information 2015 [place], [place]. This information is not intended to replace advice given to you by your health care provider. Make sure you discuss any questions you have with your health care provider.

## 2014-12-13 NOTE — ED Provider Notes (Signed)
CSN: [id number]     Arrival date & time 12/13/14  2114 History  This chart was scribed for Ottie Glazier, PA-C working with No att. providers found by Mercy Moore, ED Scribe. This patient was seen in room TR10C/TR10C and the patient's care was started at 9:58 PM.   Chief Complaint  Patient presents with  . Dental Pain   The history is provided by the patient. No language interpreter was used.   HPI Comments: Scott Glenn is a 44 y.o. male who presents to the Emergency Department complaining of aching right molar dental pain for three days now. Patient reports treatment with Advil and BC powder which only provided temporarily relief. Patient does not have a dentist. He denies any facial swelling, difficulty breathing, drooling, shortness of breath.  Past Medical History  Diagnosis Date  . Asthma    History reviewed. No pertinent past surgical history. Family History  Problem Relation Age of Onset  . Hypertension Mother   . Stroke Father    History  Substance Use Topics  . Smoking status: Never Smoker   . Smokeless tobacco: Never Used  . Alcohol Use: No    Review of Systems  Constitutional: Negative for fever and chills.  HENT: Positive for dental problem. Negative for facial swelling and sore throat.     Allergies  Banana and Pork-derived products  Home Medications   Prior to Admission medications   Medication Sig Start Date End Date Taking? Authorizing Provider  amoxicillin (AMOXIL) 500 MG capsule Take 1 capsule (500 mg total) by mouth 3 (three) times daily. 12/13/14   Donivin Wirt Patel-Mills, PA-C  Aspirin-Salicylamide-Caffeine (BC HEADACHE POWDER PO) Take by mouth every 8 (eight) hours as needed. For pain    Historical Provider, MD  benzocaine (ORAJEL) 10 % mucosal gel Use as directed 1 application in the mouth or throat as needed for mouth pain.    Historical Provider, MD  cetirizine (ZYRTEC) 10 MG tablet Take 10 mg by mouth daily.    Historical Provider, MD   clindamycin (CLEOCIN) 300 MG capsule Take 1 capsule (300 mg total) by mouth 3 (three) times daily. 08/27/14   Waldemar Dickens, MD  OVER THE COUNTER MEDICATION Take 1 tablet by mouth at bedtime as needed (for sleep. OTC Sleep Aid).    Historical Provider, MD  oxyCODONE-acetaminophen (PERCOCET/ROXICET) 5-325 MG per tablet Take 2 tablets by mouth every 4 (four) hours as needed for severe pain. 12/13/14   Galit Urich Patel-Mills, PA-C  traMADol (ULTRAM) 50 MG tablet Take 1 tablet (50 mg total) by mouth every 6 (six) hours as needed. 08/27/14   Waldemar Dickens, MD   Triage Vitals: BP 142/89 mmHg  Pulse 71  Temp(Src) 98.7 F (37.1 C) (Oral)  Resp 20  Ht 5\' 9"  (1.753 m)  Wt 175 lb (79.379 kg)  BMI 25.83 kg/m2  SpO2 97% Physical Exam  Constitutional: He is oriented to person, place, and time. He appears well-developed and well-nourished. No distress.  HENT:  Head: Normocephalic and atraumatic.  Mouth/Throat: No trismus in the jaw. Dental caries present. No dental abscesses.    Right submandibular tenderness, but no cervical lymhadenopathy or facial swelling. No palpabable abscess. No trismus, drooling or throat swelling.   Eyes: EOM are normal.  Neck: Neck supple. No tracheal deviation present.  Cardiovascular: Normal rate.   Pulmonary/Chest: Effort normal. No respiratory distress.  Musculoskeletal: Normal range of motion.  Lymphadenopathy:    He has no cervical adenopathy.  Neurological: He is alert and  oriented to person, place, and time.  Skin: Skin is warm and dry.  Psychiatric: He has a normal mood and affect. His behavior is normal.  Nursing note and vitals reviewed.   ED Course  Procedures (including critical care time)  COORDINATION OF CARE: 10:07 PM- Plans to prescribe antibiotics and pain medication. Will provide patient with dental resource guide. Discussed treatment plan with patient at bedside and patient agreed to plan.   Labs Review Labs Reviewed - No data to  display  Imaging Review No results found.   EKG Interpretation None      MDM   Final diagnoses:  Pain due to dental caries  Patient presents for dental pain. There is no palpable dental abscess or facial swelling. He has no signs of Ludwigs angina. He has no submandibular pain, he is not tripoding, he is not drooling. He is currently afebrile. I have put him on amoxicillin and Percocet for pain. I referred him to a dentist and gave him the resource guide for further evaluation of his teeth. Patient verbally agrees with the plan.    Ottie Glazier, PA-C 12/14/14 [place], MD 12/15/14 [phone]

## 2014-12-13 NOTE — ED Notes (Signed)
Broken tooth to the right side back of his [place]

## 2014-12-15 ENCOUNTER — Emergency Department (HOSPITAL_COMMUNITY)
Admission: EM | Admit: 2014-12-15 | Discharge: 2014-12-15 | Disposition: A | Discharge status: Home / Self Care | Payer: Self-pay | Attending: Emergency Medicine | Admitting: Emergency Medicine

## 2014-12-15 ENCOUNTER — Encounter (HOSPITAL_COMMUNITY): Payer: Self-pay | Admitting: *Deleted

## 2014-12-15 DIAGNOSIS — R112 Nausea with vomiting, unspecified: Secondary | ICD-10-CM | POA: Insufficient documentation

## 2014-12-15 DIAGNOSIS — K088 Other specified disorders of teeth and supporting structures: Secondary | ICD-10-CM | POA: Insufficient documentation

## 2014-12-15 DIAGNOSIS — Z98811 Dental restoration status: Secondary | ICD-10-CM | POA: Insufficient documentation

## 2014-12-15 DIAGNOSIS — K029 Dental caries, unspecified: Secondary | ICD-10-CM | POA: Insufficient documentation

## 2014-12-15 DIAGNOSIS — Z79899 Other long term (current) drug therapy: Secondary | ICD-10-CM | POA: Insufficient documentation

## 2014-12-15 DIAGNOSIS — Z792 Long term (current) use of antibiotics: Secondary | ICD-10-CM | POA: Insufficient documentation

## 2014-12-15 DIAGNOSIS — J45909 Unspecified asthma, uncomplicated: Secondary | ICD-10-CM | POA: Insufficient documentation

## 2014-12-15 MED ORDER — OXYCODONE-ACETAMINOPHEN 5-325 MG PO TABS
2.0000 | ORAL_TABLET | Freq: Once | ORAL | Status: AC
  Administered 2014-12-15: 2 via ORAL
  Filled 2014-12-15: qty 2

## 2014-12-15 MED ORDER — METOCLOPRAMIDE HCL 10 MG PO TABS: 10.0000 mg | ORAL_TABLET | Freq: Four times a day (QID) | ORAL | Status: DC

## 2014-12-15 MED ORDER — OXYCODONE-ACETAMINOPHEN 5-325 MG PO TABS: 2.0000 | ORAL_TABLET | ORAL | Status: DC | PRN

## 2014-12-15 MED ORDER — ONDANSETRON 4 MG PO TBDP
4.0000 mg | ORAL_TABLET | Freq: Once | ORAL | Status: AC
  Administered 2014-12-15: 4 mg via ORAL
  Filled 2014-12-15: qty 1

## 2014-12-15 MED ORDER — BUPIVACAINE-EPINEPHRINE (PF) 0.5% -1:200000 IJ SOLN
1.8000 mL | Freq: Once | INTRAMUSCULAR | Status: AC
  Administered 2014-12-15: 1.8 mL
  Filled 2014-12-15: qty 1.8

## 2014-12-15 MED ORDER — NAPROXEN 500 MG PO TABS: 500.0000 mg | ORAL_TABLET | Freq: Two times a day (BID) | ORAL | Status: DC

## 2014-12-15 NOTE — ED Provider Notes (Signed)
CSN: [id number]     Arrival date & time 12/15/14  5427 History  This chart was scribed for Delsa Grana, PA-C, working with Dr. Nat Christen by Steva Colder, ED Scribe. The patient was seen in room TR09C/TR09C at 9:05 AM.    Chief Complaint  Patient presents with  . Dental Pain      The history is provided by the patient. No language interpreter was used.     HPI Comments: Scott Glenn is a 44 y.o. male who presents to the Emergency Department complaining of right lower dental pain onset 4 days. Pt reports that he has not eaten since Thursday and he cant eat due to the pain and associated nausea from pain medicine.  Pt was seen in the ED 3 days ago and was given percocet and an abx, however he did not have any procedures, such as a dental block.   He was initially seen on a Friday night and has been doing his best to tolerate the pain while waitin for Monday.  He has had associated symptoms of chills, sweats, nausea, and vomiting x3. He notes that his vomit was of yellow and pink color on two separate occassions.  He has also tried advil with no relief for his symptoms. He denies fever, difficulty swallowing, or breathing, he has had no sick contacts or diarrhea.  Past Medical History  Diagnosis Date  . Asthma    History reviewed. No pertinent past surgical history. Family History  Problem Relation Age of Onset  . Hypertension Mother   . Stroke Father    History  Substance Use Topics  . Smoking status: Never Smoker   . Smokeless tobacco: Never Used  . Alcohol Use: No    Review of Systems  Constitutional: Positive for chills and diaphoresis. Negative for fever.  HENT: Positive for dental problem.   Gastrointestinal: Positive for nausea and vomiting.    Allergies  Banana and Pork-derived products  Home Medications   Prior to Admission medications   Medication Sig Start Date End Date Taking? Authorizing Provider  amoxicillin (AMOXIL) 500 MG capsule Take 1 capsule (500 mg  total) by mouth 3 (three) times daily. 12/13/14   Hanna Patel-Mills, PA-C  Aspirin-Salicylamide-Caffeine (BC HEADACHE POWDER PO) Take by mouth every 8 (eight) hours as needed. For pain    Historical Provider, MD  benzocaine (ORAJEL) 10 % mucosal gel Use as directed 1 application in the mouth or throat as needed for mouth pain.    Historical Provider, MD  cetirizine (ZYRTEC) 10 MG tablet Take 10 mg by mouth daily.    Historical Provider, MD  clindamycin (CLEOCIN) 300 MG capsule Take 1 capsule (300 mg total) by mouth 3 (three) times daily. 08/27/14   Waldemar Dickens, MD  OVER THE COUNTER MEDICATION Take 1 tablet by mouth at bedtime as needed (for sleep. OTC Sleep Aid).    Historical Provider, MD  oxyCODONE-acetaminophen (PERCOCET/ROXICET) 5-325 MG per tablet Take 2 tablets by mouth every 4 (four) hours as needed for severe pain. 12/13/14   Hanna Patel-Mills, PA-C  traMADol (ULTRAM) 50 MG tablet Take 1 tablet (50 mg total) by mouth every 6 (six) hours as needed. 08/27/14   Waldemar Dickens, MD   BP 132/88 mmHg  Pulse 60  Temp(Src) 98 F (36.7 C) (Oral)  Resp 18  SpO2 100% Physical Exam  Constitutional: He is oriented to person, place, and time. He appears well-developed and well-nourished. No distress.  HENT:  Head: Normocephalic and atraumatic.  Mouth/Throat: Oropharynx is clear and moist. No trismus in the jaw. Abnormal dentition. Dental caries present. No posterior oropharyngeal edema or posterior oropharyngeal erythema.  Teeth largely in poor dentition,  Multiple caries,  , handling secretions appropriately, no trismus. Tooth #31 and 32 on the right has old fillings, TTP and surrounding edema in the gums and cheek.    Eyes: EOM are normal.  Neck: Neck supple. No tracheal deviation present.  Cardiovascular: Normal rate.   Pulmonary/Chest: Effort normal. No respiratory distress.  Musculoskeletal: Normal range of motion.  Neurological: He is alert and oriented to person, place, and time.  Skin:  Skin is warm and dry.  Psychiatric: He has a normal mood and affect. His behavior is normal.  Nursing note and vitals reviewed.   ED Course  Procedures (including critical care time) DIAGNOSTIC STUDIES: Oxygen Saturation is 100% on RA, nl by my interpretation.    COORDINATION OF CARE: 9:13 AM-Discussed treatment plan which includes pain medication, zofran, and local block with pt at bedside and pt agreed to plan.   Labs Review Labs Reviewed - No data to display  Imaging Review No results found.   EKG Interpretation None      MDM   Final diagnoses:  None    Pt presents with dental pain consistent with visit from 3 days ago.  Total of 4 days of dental pain and swelling. He filled his prescriptions for abx and percocet and has had nausea and vomiting x3, with persistent pain, unchanged from previous visit.   No gross abscess.  Exam unconcerning for Ludwig's angina or spread of infection.  Will treat with local block today for relief, pt plans on going to dentist tomorrow, has resource guide.  Will treat his nausea with Zofran and make sure he can tolerate PO's before discharge.  Delos Haring Pa-C to assist with dental block.  Delsa Grana, PA-C 9:38 AM    I personally performed the services described in this documentation, which was scribed in my presence. The recorded information has been reviewed and is accurate.    Delsa Grana, PA-C 12/19/14 [id number]  Pamella Pert, MD 12/19/14 [phone]

## 2014-12-15 NOTE — ED Notes (Signed)
Pt stated " ALL you need is a sharp knif and a pair of pliers."

## 2014-12-15 NOTE — ED Notes (Signed)
Declined W/C at D/C and was escorted to lobby by RN. 

## 2014-12-15 NOTE — ED Notes (Signed)
Pt returns for recheck of dental pin.

## 2014-12-15 NOTE — ED Notes (Signed)
Patient and family provided room temperature ginger ale per request

## 2014-12-15 NOTE — Discharge Instructions (Signed)
Please take all of your antibiotics until finished!   You may develop abdominal discomfort or diarrhea from the antibiotic.  You may help offset this with probiotics which you can buy or get in yogurt. Do not eat  or take the probiotics until 2 hours after your antibiotic.   Dental Pain A tooth ache may be caused by cavities (tooth decay). Cavities expose the nerve of the tooth to air and hot or cold temperatures. It may come from an infection or abscess (also called a boil or furuncle) around your tooth. It is also often caused by dental caries (tooth decay). This causes the pain you are having. DIAGNOSIS  Your caregiver can diagnose this problem by exam. TREATMENT   If caused by an infection, it may be treated with medications which kill germs (antibiotics) and pain medications as prescribed by your caregiver. Take medications as directed.  Only take over-the-counter or prescription medicines for pain, discomfort, or fever as directed by your caregiver.  Whether the tooth ache today is caused by infection or dental disease, you should see your dentist as soon as possible for further care. SEEK MEDICAL CARE IF: The exam and treatment you received today has been provided on an emergency basis only. This is not a substitute for complete medical or dental care. If your problem worsens or new problems (symptoms) appear, and you are unable to meet with your dentist, call or return to this location. SEEK IMMEDIATE MEDICAL CARE IF:   You have a fever.  You develop redness and swelling of your face, jaw, or neck.  You are unable to open your mouth.  You have severe pain uncontrolled by pain medicine. MAKE SURE YOU:   Understand these instructions.  Will watch your condition.  Will get help right away if you are not doing well or get worse. Document Released: 06/21/2005 Document Revised: 09/13/2011 Document Reviewed: 02/07/2008 [facility] Patient Information 2015 [place], [place]. This  information is not intended to replace advice given to you by your health care provider. Make sure you discuss any questions you have with your health care provider.

## 2015-10-08 ENCOUNTER — Encounter (HOSPITAL_COMMUNITY): Payer: Self-pay | Admitting: Family Medicine

## 2015-10-08 ENCOUNTER — Emergency Department (HOSPITAL_COMMUNITY)
Admission: EM | Admit: 2015-10-08 | Discharge: 2015-10-08 | Disposition: A | Discharge status: Home / Self Care | Payer: Worker's Compensation | Attending: Emergency Medicine | Admitting: Emergency Medicine

## 2015-10-08 ENCOUNTER — Emergency Department (HOSPITAL_COMMUNITY): Payer: Worker's Compensation

## 2015-10-08 DIAGNOSIS — J45909 Unspecified asthma, uncomplicated: Secondary | ICD-10-CM | POA: Insufficient documentation

## 2015-10-08 DIAGNOSIS — Y9389 Activity, other specified: Secondary | ICD-10-CM | POA: Insufficient documentation

## 2015-10-08 DIAGNOSIS — W231XXA Caught, crushed, jammed, or pinched between stationary objects, initial encounter: Secondary | ICD-10-CM | POA: Insufficient documentation

## 2015-10-08 DIAGNOSIS — Y990 Civilian activity done for income or pay: Secondary | ICD-10-CM | POA: Diagnosis not present

## 2015-10-08 DIAGNOSIS — S60221A Contusion of right hand, initial encounter: Secondary | ICD-10-CM | POA: Insufficient documentation

## 2015-10-08 DIAGNOSIS — Z792 Long term (current) use of antibiotics: Secondary | ICD-10-CM | POA: Insufficient documentation

## 2015-10-08 DIAGNOSIS — S6991XA Unspecified injury of right wrist, hand and finger(s), initial encounter: Secondary | ICD-10-CM | POA: Diagnosis present

## 2015-10-08 DIAGNOSIS — Y9289 Other specified places as the place of occurrence of the external cause: Secondary | ICD-10-CM | POA: Diagnosis not present

## 2015-10-08 DIAGNOSIS — Z79899 Other long term (current) drug therapy: Secondary | ICD-10-CM | POA: Insufficient documentation

## 2015-10-08 MED ORDER — DICLOFENAC SODIUM 50 MG PO TBEC: 50.0000 mg | DELAYED_RELEASE_TABLET | Freq: Two times a day (BID) | ORAL | Status: DC

## 2015-10-08 MED ORDER — HYDROCODONE-ACETAMINOPHEN 5-325 MG PO TABS
1.0000 | ORAL_TABLET | Freq: Once | ORAL | Status: AC
  Administered 2015-10-08: 1 via ORAL
  Filled 2015-10-08: qty 1

## 2015-10-08 NOTE — ED Notes (Signed)
Pt here for right hand pain. sts he smashed between door knob and dresser. sts happened today. Pt able to move hand.

## 2015-10-08 NOTE — ED Provider Notes (Signed)
CSN: [id number]     Arrival date & time 10/08/15  1623 History   By signing my name below, I, Nicole Kindred, attest that this documentation has been prepared under the direction and in the presence of [place], NP.   Electronically Signed: Nicole Kindred, ED Scribe 10/08/2015 at 7:03 PM.    Chief Complaint  Patient presents with  . Hand Injury    Patient is a 45 y.o. male presenting with hand injury. The history is provided by the patient. No language interpreter was used.  Hand Injury Time since incident:  1 day Injury: yes   Mechanism of injury: crush   Pain details:    Quality:  Unable to specify   Radiates to:  Does not radiate   Severity:  Moderate   Onset quality:  Sudden   Timing:  Constant   Progression:  Unchanged Chronicity:  New Dislocation: no   Foreign body present:  No foreign bodies Prior injury to area:  Unable to specify Relieved by:  Nothing Worsened by:  Nothing tried Ineffective treatments:  None tried  HPI Comments: ENEDINO CORIO is a 45 y.o. male who presents to the Emergency Department complaining of sudden onset, 8/10 right hand pain, onset early today at work when he smashed it between a door knob and a [employer]. He reports associated swelling to the hand. No other associated symptoms noted. Pain is worsened when he makes a fist with the hand. He has not taken any medication for his pain PTA. No other worsening or alleviating factors noted Pt denies numbness, weakness, or any other pertinent symptoms.   Past Medical History  Diagnosis Date  . Asthma    History reviewed. No pertinent past surgical history. Family History  Problem Relation Age of Onset  . Hypertension Mother   . Stroke Father    Social History  Substance Use Topics  . Smoking status: Never Smoker   . Smokeless tobacco: Never Used  . Alcohol Use: No    Review of Systems  Musculoskeletal: Positive for joint swelling and arthralgias.       Right hand pain    Neurological: Negative for weakness and numbness.  all other systems negative  Allergies  Banana and Pork-derived products  Home Medications   Prior to Admission medications   Medication Sig Start Date End Date Taking? Authorizing Provider  amoxicillin (AMOXIL) 500 MG capsule Take 1 capsule (500 mg total) by mouth 3 (three) times daily. 12/13/14   Hanna Patel-Mills, PA-C  Aspirin-Salicylamide-Caffeine (BC HEADACHE POWDER PO) Take by mouth every 8 (eight) hours as needed. For pain    Historical Provider, MD  benzocaine (ORAJEL) 10 % mucosal gel Use as directed 1 application in the mouth or throat as needed for mouth pain.    Historical Provider, MD  cetirizine (ZYRTEC) 10 MG tablet Take 10 mg by mouth daily.    Historical Provider, MD  clindamycin (CLEOCIN) 300 MG capsule Take 1 capsule (300 mg total) by mouth 3 (three) times daily. 08/27/14   Waldemar Dickens, MD  diclofenac (VOLTAREN) 50 MG EC tablet Take 1 tablet (50 mg total) by mouth 2 (two) times daily. 10/08/15   Ily Denno Bunnie Pion, NP  metoCLOPramide (REGLAN) 10 MG tablet Take 1 tablet (10 mg total) by mouth every 6 (six) hours. 12/15/14   Delsa Grana, PA-C  OVER THE COUNTER MEDICATION Take 1 tablet by mouth at bedtime as needed (for sleep. OTC Sleep Aid).    Historical Provider, MD  traMADol (ULTRAM) 50 MG tablet Take 1 tablet (50 mg total) by mouth every 6 (six) hours as needed. 08/27/14   Waldemar Dickens, MD   BP 125/84 mmHg  Pulse 74  Temp(Src) 98.2 F (36.8 C) (Oral)  Resp 18  Ht 5\' 10"  (1.778 m)  Wt 185 lb (83.915 kg)  BMI 26.54 kg/m2  SpO2 96% Physical Exam  Constitutional: He is oriented to person, place, and time. He appears well-developed and well-nourished. No distress.  HENT:  Head: Normocephalic.  Eyes: Conjunctivae and EOM are normal.  Neck: Normal range of motion. Neck supple.  Cardiovascular: Normal rate.   Pulmonary/Chest: Effort normal.  Abdominal: He exhibits no distension.  Musculoskeletal: Normal range of  motion.  Radial pulses 2+. Good touch sensation.  Minimal swelling to dorsum of right hand.  TTP to dorsum of right hand with increased pain to the ulnar aspect.  Full ROM to of wrist.   Neurological: He is alert and oriented to person, place, and time.  Skin: Skin is warm and dry.  Psychiatric: He has a normal mood and affect. His behavior is normal.  Nursing note and vitals reviewed.   ED Course  Procedures (including critical care time) DIAGNOSTIC STUDIES: Oxygen Saturation is 96% on RA, adequate by my interpretation.    COORDINATION OF CARE: 5:38 PM-Discussed treatment plan which includes DG hand right and norco/vicodin with pt at bedside and pt agreed to plan.   Labs Review Labs Reviewed - No data to display  Imaging Review Dg Hand Complete Right  10/08/2015  CLINICAL DATA:  45 year old male with blunt trauma to the right hand wall moving furniture. Fourth and fifth metacarpal pain. Initial encounter. EXAM: RIGHT HAND - COMPLETE 3+ VIEW COMPARISON:  12/04/2006. FINDINGS: Healed chronic fractures of the third, fourth, and fifth metacarpals. Distal radius and ulna appear stable and intact. Carpal bones appear normal. No acute metacarpal fracture. Phalanges appear stable and intact. Joint spaces within normal limits. IMPRESSION: No acute fracture or dislocation identified about the right hand. Healed third, fourth, and fifth metacarpal fractures. Electronically Signed   By: Genevie Ann M.D.   On: 10/08/2015 17:01    MDM  45 y.o. male with right hand pain s/p work injury stable for d/c without focal neuro deficits. Ace wrap applied, ice elevation and Rx for NSAIDS; patient to f/u with hand if symptoms persist.   Final diagnoses:  Contusion, hand, right, initial encounter    I personally performed the services described in this documentation, which was scribed in my presence. The recorded information has been reviewed and is accurate.    [address] [place], NP 10/08/15 [id number]  Veryl Speak, MD 10/08/15 [phone]

## 2016-06-19 ENCOUNTER — Emergency Department (HOSPITAL_COMMUNITY)
Admission: EM | Admit: 2016-06-19 | Discharge: 2016-06-19 | Disposition: A | Discharge status: Home / Self Care | Payer: Self-pay | Attending: Emergency Medicine | Admitting: Emergency Medicine

## 2016-06-19 ENCOUNTER — Encounter (HOSPITAL_COMMUNITY): Payer: Self-pay | Admitting: *Deleted

## 2016-06-19 DIAGNOSIS — J45909 Unspecified asthma, uncomplicated: Secondary | ICD-10-CM | POA: Insufficient documentation

## 2016-06-19 DIAGNOSIS — R519 Headache, unspecified: Secondary | ICD-10-CM

## 2016-06-19 DIAGNOSIS — J0110 Acute frontal sinusitis, unspecified: Secondary | ICD-10-CM

## 2016-06-19 DIAGNOSIS — R51 Headache: Secondary | ICD-10-CM | POA: Insufficient documentation

## 2016-06-19 DIAGNOSIS — J019 Acute sinusitis, unspecified: Secondary | ICD-10-CM | POA: Insufficient documentation

## 2016-06-19 MED ORDER — ONDANSETRON 4 MG PO TBDP
8.0000 mg | ORAL_TABLET | Freq: Once | ORAL | Status: AC
  Administered 2016-06-19: 8 mg via ORAL
  Filled 2016-06-19: qty 2

## 2016-06-19 MED ORDER — IBUPROFEN 400 MG PO TABS
600.0000 mg | ORAL_TABLET | Freq: Once | ORAL | Status: AC
  Administered 2016-06-19: 08:00:00 600 mg via ORAL
  Filled 2016-06-19: qty 1

## 2016-06-19 MED ORDER — AMOXICILLIN 500 MG PO CAPS: 500.0000 mg | ORAL_CAPSULE | Freq: Three times a day (TID) | ORAL | 0 refills | Status: DC

## 2016-06-19 NOTE — ED Provider Notes (Signed)
[place] DEPT Provider Note   CSN: [id number] Arrival date & time: 06/19/16  0700     History   Chief Complaint No chief complaint on file.   HPI Scott Glenn is a 45 y.o. male.  Patient indicates he feels he may have sinus infection. He c/o recent ear and nasal congestion, feeling as if had fluid in ears, and then over past 1--2 days noted gradual onset, of pain in frontal sinus area. Pain dull, moderate, persistent. Denies abrupt/acute worsening of pain. No associated eye pain or change in vision. No neck pain or stiffness. Denies sore throat. No chest pain or sob. +Nausea. No photophobia or phonophobia. Denies fever.    The history is provided by the patient.    Past Medical History:  Diagnosis Date  . Asthma     Patient Active Problem List   Diagnosis Date Noted  . ALLERGIC RHINITIS WITH CONJUNCTIVITIS 05/02/2008  . ASTHMA 05/02/2008  . GASTRITIS 03/03/2007    No past surgical history on file.     Home Medications    Prior to Admission medications   Medication Sig Start Date End Date Taking? Authorizing Provider  amoxicillin (AMOXIL) 500 MG capsule Take 1 capsule (500 mg total) by mouth 3 (three) times daily. 12/13/14   Hanna Patel-Mills, PA-C  Aspirin-Salicylamide-Caffeine (BC HEADACHE POWDER PO) Take by mouth every 8 (eight) hours as needed. For pain    Historical Provider, MD  benzocaine (ORAJEL) 10 % mucosal gel Use as directed 1 application in the mouth or throat as needed for mouth pain.    Historical Provider, MD  cetirizine (ZYRTEC) 10 MG tablet Take 10 mg by mouth daily.    Historical Provider, MD  clindamycin (CLEOCIN) 300 MG capsule Take 1 capsule (300 mg total) by mouth 3 (three) times daily. 08/27/14   Waldemar Dickens, MD  diclofenac (VOLTAREN) 50 MG EC tablet Take 1 tablet (50 mg total) by mouth 2 (two) times daily. 10/08/15   Hope Bunnie Pion, NP  metoCLOPramide (REGLAN) 10 MG tablet Take 1 tablet (10 mg total) by mouth every 6 (six) hours.  12/15/14   Delsa Grana, PA-C  OVER THE COUNTER MEDICATION Take 1 tablet by mouth at bedtime as needed (for sleep. OTC Sleep Aid).    Historical Provider, MD  traMADol (ULTRAM) 50 MG tablet Take 1 tablet (50 mg total) by mouth every 6 (six) hours as needed. 08/27/14   Waldemar Dickens, MD    Family History Family History  Problem Relation Age of Onset  . Hypertension Mother   . Stroke Father     Social History Social History  Substance Use Topics  . Smoking status: Never Smoker  . Smokeless tobacco: Never Used  . Alcohol use No     Allergies   Banana and Pork-derived products   Review of Systems Review of Systems  Constitutional: Negative for fever.  HENT: Positive for congestion and sinus pain. Negative for sore throat.   Eyes: Negative for photophobia, pain, redness and visual disturbance.  Respiratory: Negative for shortness of breath.   Cardiovascular: Negative for chest pain.  Gastrointestinal: Negative for abdominal pain.  Musculoskeletal: Negative for neck pain and neck stiffness.  Skin: Negative for rash.  Neurological: Positive for headaches. Negative for weakness and numbness.  Psychiatric/Behavioral: Negative for confusion.     Physical Exam Updated Vital Signs BP (!) 133/101 (BP Location: Right Arm)   Pulse 67   Temp 98.4 F (36.9 C) (Oral)   Resp 16  SpO2 98%   Physical Exam  Constitutional: He appears well-developed and well-nourished. No distress.  HENT:  Head: Atraumatic.  Mouth/Throat: Oropharynx is clear and moist.  +frontal sinus tenderness. Nasal congestion. Clear fluid behind left tm. No acute om.   Eyes: Conjunctivae and EOM are normal. Pupils are equal, round, and reactive to light.  Neck: Neck supple. No tracheal deviation present.  No stiffness or rigidity  Cardiovascular: Normal rate, regular rhythm, normal heart sounds and intact distal pulses.   Pulmonary/Chest: Effort normal and breath sounds normal. No accessory muscle usage. No  respiratory distress.  Musculoskeletal: He exhibits no edema.  Neurological: He is alert.  Speech clear/fluent. Ambulates w steady gait.   Skin: Skin is warm and dry. He is not diaphoretic.  Psychiatric: He has a normal mood and affect.  Nursing note and vitals reviewed.    ED Treatments / Results  Labs (all labs ordered are listed, but only abnormal results are displayed) Labs Reviewed - No data to display  EKG  EKG Interpretation None       Radiology No results found.  Procedures Procedures (including critical care time)  Medications Ordered in ED Medications - No data to display   Initial Impression / Assessment and Plan / ED Course  I have reviewed the triage vital signs and the nursing notes.  Pertinent labs & imaging results that were available during my care of the patient were reviewed by me and considered in my medical decision making (see chart for details).  Clinical Course     Motrin po for pain(pt drove self). zofran for nausea.   Po fluids.  Given sinus congestion and pain will give rx for possible sinusitis.     Final Clinical Impressions(s) / ED Diagnoses   Final diagnoses:  None    New Prescriptions New Prescriptions   No medications on file     Lajean Saver, MD 06/19/16 [phone]

## 2016-06-19 NOTE — Discharge Instructions (Signed)
It was our pleasure to provide your ER care today - we hope that you feel better.  Rest. Drink plenty of fluids.  Take motrin or aleve as need for pain.  Take amoxicillin as prescribed.  Try over the counter sinus medication, such as mucinex sinus-max, as need for symptom relief.  Follow up with primary care doctor in the coming week if symptoms fail to improve/resolve.  Return to ER if worse, new symptoms, other concern.

## 2016-06-19 NOTE — ED Notes (Signed)
Pt. Able to tolerate fluids at this time.

## 2016-06-19 NOTE — ED Triage Notes (Addendum)
Pt states ear "has felt like water in it" x 1 week.  Woke up out of his sleep at 2 am with headache behind L eye and dizziness.

## 2016-07-26 ENCOUNTER — Encounter (HOSPITAL_COMMUNITY): Payer: Self-pay

## 2016-07-26 ENCOUNTER — Emergency Department (HOSPITAL_COMMUNITY)
Admission: EM | Admit: 2016-07-26 | Discharge: 2016-07-26 | Disposition: A | Discharge status: Home / Self Care | Payer: BLUE CROSS/BLUE SHIELD | Attending: Emergency Medicine | Admitting: Emergency Medicine

## 2016-07-26 DIAGNOSIS — J45909 Unspecified asthma, uncomplicated: Secondary | ICD-10-CM | POA: Diagnosis not present

## 2016-07-26 DIAGNOSIS — M545 Low back pain: Secondary | ICD-10-CM | POA: Diagnosis not present

## 2016-07-26 DIAGNOSIS — R6889 Other general symptoms and signs: Secondary | ICD-10-CM

## 2016-07-26 DIAGNOSIS — R05 Cough: Secondary | ICD-10-CM | POA: Insufficient documentation

## 2016-07-26 MED ORDER — BENZONATATE 100 MG PO CAPS: 100.0000 mg | ORAL_CAPSULE | Freq: Three times a day (TID) | ORAL | 0 refills | Status: DC | PRN

## 2016-07-26 MED ORDER — ACETAMINOPHEN 500 MG PO TABS
1000.0000 mg | ORAL_TABLET | Freq: Once | ORAL | Status: AC
  Administered 2016-07-26: 1000 mg via ORAL
  Filled 2016-07-26: qty 2

## 2016-07-26 NOTE — ED Notes (Signed)
ED Provider at bedside. 

## 2016-07-26 NOTE — ED Provider Notes (Signed)
[facility] DEPT Provider Note   CSN: [id number] Arrival date & time: 07/26/16  [id number]     History   Chief Complaint Chief Complaint  Patient presents with  . Cough  . Fever  . Generalized Body Aches    HPI SILIS GAETZ is a 46 y.o. male.  HPI  46 year old male presents with fever and cough starting 2 days ago. He states that he started with cough, headache, low back pain, and poor appetite. He states he's been having no appetite and doesn't want to drink water. There is no nausea, vomiting, or sore throat. He has had some congestion. No urinary symptoms or diarrhea. He states his son had a similar illness and has already resolved. He is not having any chest pain, abdominal pain, or shortness of breath. Denies any past medical history, chart lists asthma but he states he has not had an asthma attack in years. His temperature has been up to 100.9, most recently this morning. He took Mucinex yesterday but it did not help. Cough has clear sputum.  Past Medical History:  Diagnosis Date  . Asthma     Patient Active Problem List   Diagnosis Date Noted  . ALLERGIC RHINITIS WITH CONJUNCTIVITIS 05/02/2008  . ASTHMA 05/02/2008  . GASTRITIS 03/03/2007    History reviewed. No pertinent surgical history.     Home Medications    Prior to Admission medications   Medication Sig Start Date End Date Taking? Authorizing Provider  amoxicillin (AMOXIL) 500 MG capsule Take 1 capsule (500 mg total) by mouth 3 (three) times daily. 12/13/14   Hanna Patel-Mills, PA-C  amoxicillin (AMOXIL) 500 MG capsule Take 1 capsule (500 mg total) by mouth 3 (three) times daily. 06/19/16   Lajean Saver, MD  Aspirin-Salicylamide-Caffeine [facility] HEADACHE POWDER PO) Take by mouth every 8 (eight) hours as needed. For pain    Historical Provider, MD  benzocaine (ORAJEL) 10 % mucosal gel Use as directed 1 application in the mouth or throat as needed for mouth pain.    Historical Provider, MD  benzonatate  (TESSALON) 100 MG capsule Take 1 capsule (100 mg total) by mouth 3 (three) times daily as needed for cough. 07/26/16   Sherwood Gambler, MD  cetirizine (ZYRTEC) 10 MG tablet Take 10 mg by mouth daily.    Historical Provider, MD  clindamycin (CLEOCIN) 300 MG capsule Take 1 capsule (300 mg total) by mouth 3 (three) times daily. 08/27/14   Waldemar Dickens, MD  diclofenac (VOLTAREN) 50 MG EC tablet Take 1 tablet (50 mg total) by mouth 2 (two) times daily. 10/08/15   Hope Bunnie Pion, NP  metoCLOPramide (REGLAN) 10 MG tablet Take 1 tablet (10 mg total) by mouth every 6 (six) hours. 12/15/14   Delsa Grana, PA-C  OVER THE COUNTER MEDICATION Take 1 tablet by mouth at bedtime as needed (for sleep. OTC Sleep Aid).    Historical Provider, MD  traMADol (ULTRAM) 50 MG tablet Take 1 tablet (50 mg total) by mouth every 6 (six) hours as needed. 08/27/14   Waldemar Dickens, MD    Family History Family History  Problem Relation Age of Onset  . Hypertension Mother   . Stroke Father     Social History Social History  Substance Use Topics  . Smoking status: Never Smoker  . Smokeless tobacco: Never Used  . Alcohol use No     Allergies   Banana and Pork-derived products   Review of Systems Review of Systems  Constitutional: Positive for fever.  HENT: Positive for congestion. Negative for sore throat.   Respiratory: Positive for cough. Negative for shortness of breath.   Cardiovascular: Negative for chest pain.  Gastrointestinal: Negative for abdominal pain, diarrhea, nausea and vomiting.  Genitourinary: Negative for enuresis.  Musculoskeletal: Positive for back pain.  Neurological: Positive for headaches.  All other systems reviewed and are negative.    Physical Exam Updated Vital Signs BP 127/86   Pulse 96   Temp 100 F (37.8 C) (Oral)   Resp 18   Ht 5\' 10"  (1.778 m)   Wt 190 lb (86.2 kg)   SpO2 98%   BMI 27.26 kg/m   Physical Exam  Constitutional: He is oriented to person, place, and time. He  appears well-developed and well-nourished. No distress.  HENT:  Head: Normocephalic and atraumatic.  Right Ear: External ear normal.  Left Ear: External ear normal.  Nose: Nose normal.  Mouth/Throat: Oropharynx is clear and moist. No oropharyngeal exudate.  Eyes: Right eye exhibits no discharge. Left eye exhibits no discharge.  Neck: Normal range of motion. Neck supple.  Full ROM without stiffness present  Cardiovascular: Normal rate, regular rhythm and normal heart sounds.   Pulmonary/Chest: Effort normal and breath sounds normal. He has no wheezes. He has no rales.  Abdominal: Soft. There is no tenderness.  Musculoskeletal: He exhibits no edema.       Thoracic back: He exhibits no bony tenderness.       Lumbar back: He exhibits tenderness (mild lateral low lumbar tenderness). He exhibits no bony tenderness.  Neurological: He is alert and oriented to person, place, and time.  Skin: Skin is warm and dry. He is not diaphoretic.  Nursing note and vitals reviewed.    ED Treatments / Results  Labs (all labs ordered are listed, but only abnormal results are displayed) Labs Reviewed - No data to display  EKG  EKG Interpretation None       Radiology No results found.  Procedures Procedures (including critical care time)  Medications Ordered in ED Medications  acetaminophen (TYLENOL) tablet 1,000 mg (1,000 mg Oral Given 07/26/16 0720)     Initial Impression / Assessment and Plan / ED Course  I have reviewed the triage vital signs and the nursing notes.  Pertinent labs & imaging results that were available during my care of the patient were reviewed by me and considered in my medical decision making (see chart for details).     Patient symptoms are consistent with a viral illness, possibly flu. He is overall well-appearing with a low-grade fever. He has no increased work of breathing, abnormal lung sounds, or hypoxia. Highly doubt pneumonia. Appears adequately hydrated at  this time. I have stressed using ibuprofen and Tylenol as needed for headache and aches and fever. Also drinking a lot of oral fluids. Will prescribe something for cough. At this point I have low suspicion for a bacterial etiology. He does not have any significant risk factors that would warrant Tamiflu treatment. Discussed strict return precautions.  Final Clinical Impressions(s) / ED Diagnoses   Final diagnoses:  Flu-like symptoms    New Prescriptions New Prescriptions   BENZONATATE (TESSALON) 100 MG CAPSULE    Take 1 capsule (100 mg total) by mouth 3 (three) times daily as needed for cough.     Sherwood Gambler, MD 07/26/16 [phone]

## 2016-07-26 NOTE — ED Triage Notes (Signed)
Pt states yesterday he started having flu like sx; pt states fever of 100.9 at home and he has taken OTC; Pt states body aches at 7/10 on arrival. Pt a&ox 4 on arrival.

## 2016-11-15 ENCOUNTER — Ambulatory Visit (HOSPITAL_COMMUNITY)
Admission: EM | Admit: 2016-11-15 | Discharge: 2016-11-15 | Disposition: A | Discharge status: Home / Self Care | Payer: BLUE CROSS/BLUE SHIELD | Attending: Internal Medicine | Admitting: Internal Medicine

## 2016-11-15 ENCOUNTER — Encounter (HOSPITAL_COMMUNITY): Payer: Self-pay | Admitting: *Deleted

## 2016-11-15 DIAGNOSIS — Z7982 Long term (current) use of aspirin: Secondary | ICD-10-CM | POA: Diagnosis not present

## 2016-11-15 DIAGNOSIS — Z79899 Other long term (current) drug therapy: Secondary | ICD-10-CM | POA: Insufficient documentation

## 2016-11-15 DIAGNOSIS — R319 Hematuria, unspecified: Secondary | ICD-10-CM | POA: Insufficient documentation

## 2016-11-15 LAB — POCT URINALYSIS DIP (DEVICE)
Bilirubin Urine: NEGATIVE
Glucose, UA: NEGATIVE mg/dL
Ketones, ur: NEGATIVE mg/dL
LEUKOCYTES UA: NEGATIVE
NITRITE: NEGATIVE
PROTEIN: NEGATIVE mg/dL
Specific Gravity, Urine: 1.025 (ref 1.005–1.030)
UROBILINOGEN UA: 1 mg/dL (ref 0.0–1.0)
pH: 6 (ref 5.0–8.0)

## 2016-11-15 NOTE — Discharge Instructions (Signed)
If your symptoms persist for 5 days, return to clinic for further evaluation. Drink plenty of water, avoid suppliments, and energy drinks. If your symptoms persist, you may need referral to an urologist, I recommend establishing with a primary care provider to make this referral easier for you. If you develop pain, discharge, or burning with urination, return to clinic as needed.

## 2016-11-15 NOTE — ED Provider Notes (Signed)
CSN: [id number]     Arrival date & time 11/15/16  4097 History   First MD Initiated Contact with Patient 11/15/16 1022     Chief Complaint  Patient presents with  . Hematuria   (Consider location/radiation/quality/duration/timing/severity/associated sxs/prior Treatment) 46 year old male presents to clinic for evaluation of hematuria. States that he noticed "pink" urine when he used the bathroom this morning. He does not have any dysuria, no discomfort, no penile discharge, no rashes or lesions, scrotum swelling or pain, back pain, flank pain, nausea, vomiting, fever, abdominal pain, or swollen lymph nodes. He is sexually active, with 1 partner, he is not concerned about any STDs, or STIs. States that approximately 2 days ago, he did take a "supplement" provided by his wife, that called stat a prolonged erection, and he had some pain, however this symptom has since resolved.   The history is provided by the patient.    Past Medical History:  Diagnosis Date  . Asthma    History reviewed. No pertinent surgical history. Family History  Problem Relation Age of Onset  . Hypertension Mother   . Stroke Father    Social History  Substance Use Topics  . Smoking status: Never Smoker  . Smokeless tobacco: Never Used  . Alcohol use No    Review of Systems  Constitutional: Negative.   HENT: Negative.   Respiratory: Negative.   Cardiovascular: Negative.   Gastrointestinal: Negative.   Genitourinary: Negative.   Musculoskeletal: Negative.   Skin: Negative.   Neurological: Negative.     Allergies  Banana and Pork-derived products  Home Medications   Prior to Admission medications   Medication Sig Start Date End Date Taking? Authorizing Provider  Aspirin-Salicylamide-Caffeine (BC HEADACHE POWDER PO) Take by mouth every 8 (eight) hours as needed. For pain    [provider]  cetirizine (ZYRTEC) 10 MG tablet Take 10 mg by mouth daily.    [provider]  OVER THE  COUNTER MEDICATION Take 1 tablet by mouth at bedtime as needed (for sleep. OTC Sleep Aid).    [provider]   Meds Ordered and Administered this Visit  Medications - No data to display  BP 132/74 (BP Location: Right Arm)   Pulse 78   Temp 98.6 F (37 C) (Oral)   Resp 18   SpO2 99%  No data found.   Physical Exam  Constitutional: He is oriented to person, place, and time. He appears well-developed and well-nourished. No distress.  HENT:  Head: Normocephalic and atraumatic.  Right Ear: External ear normal.  Left Ear: External ear normal.  Eyes: Conjunctivae are normal.  Neck: Normal range of motion.  Cardiovascular: Normal rate and regular rhythm.   Pulmonary/Chest: Effort normal and breath sounds normal.  Lymphadenopathy:    He has no cervical adenopathy.  Neurological: He is alert and oriented to person, place, and time.  Skin: Skin is warm and dry. Capillary refill takes less than 2 seconds. He is not diaphoretic.  Psychiatric: He has a normal mood and affect. His behavior is normal.  Nursing note and vitals reviewed.   Urgent Care Course     Procedures (including critical care time)  Labs Review Labs Reviewed  POCT URINALYSIS DIP (DEVICE) - Abnormal; Notable for the following:       Result Value   Hgb urine dipstick SMALL (*)    All other components within normal limits  URINE CULTURE    Imaging Review No results found.    MDM   1.  Hematuria, unspecified type    UA significant for hemoglobin, and for no other symptoms of infection, no proteinuria, or leukocytosis. Physical exam not consistent with kidney stone. Urine culture obtained, recommend watchful waiting, return to clinic in 5 days if symptoms persist.     Barnet Glasgow, NP 11/15/16 1559

## 2016-11-15 NOTE — ED Triage Notes (Signed)
Pt   Reports   Symptoms  Of   Hematuria     Noticed  This   Am      Pinkish  Tint  To  It    Denies   Any  Burning  Or   Discharge  No  pain

## 2016-11-16 LAB — URINE CULTURE: Culture: NO GROWTH

## 2016-12-16 ENCOUNTER — Encounter: Payer: Self-pay | Admitting: Adult Health

## 2016-12-16 ENCOUNTER — Ambulatory Visit (INDEPENDENT_AMBULATORY_CARE_PROVIDER_SITE_OTHER): Payer: BLUE CROSS/BLUE SHIELD | Admitting: Adult Health

## 2016-12-16 VITALS — BP 124/70 | Temp 98.1°F | Ht 70.0 in | Wt 189.4 lb

## 2016-12-16 DIAGNOSIS — R319 Hematuria, unspecified: Secondary | ICD-10-CM

## 2016-12-16 DIAGNOSIS — Z23 Encounter for immunization: Secondary | ICD-10-CM

## 2016-12-16 DIAGNOSIS — Z7689 Persons encountering health services in other specified circumstances: Secondary | ICD-10-CM

## 2016-12-16 LAB — POCT URINALYSIS DIPSTICK
Bilirubin, UA: NEGATIVE
Blood, UA: NEGATIVE
GLUCOSE UA: NEGATIVE
Ketones, UA: NEGATIVE
LEUKOCYTES UA: NEGATIVE
NITRITE UA: NEGATIVE
PROTEIN UA: NEGATIVE
Spec Grav, UA: 1.025 (ref 1.010–1.025)
UROBILINOGEN UA: 0.2 U/dL
pH, UA: 6 (ref 5.0–8.0)

## 2016-12-16 LAB — URINALYSIS, ROUTINE W REFLEX MICROSCOPIC
BILIRUBIN URINE: NEGATIVE
Hgb urine dipstick: NEGATIVE
KETONES UR: NEGATIVE
Leukocytes, UA: NEGATIVE
Nitrite: NEGATIVE
RBC / HPF: NONE SEEN (ref 0–?)
SPECIFIC GRAVITY, URINE: 1.02 (ref 1.000–1.030)
Total Protein, Urine: NEGATIVE
URINE GLUCOSE: NEGATIVE
UROBILINOGEN UA: 0.2 (ref 0.0–1.0)
WBC, UA: NONE SEEN (ref 0–?)
pH: 6 (ref 5.0–8.0)

## 2016-12-16 NOTE — Patient Instructions (Signed)
It was great meeting you today!  Please follow up with me for your physical. If you need anything in the meantime, please let me know.    

## 2016-12-16 NOTE — Progress Notes (Signed)
Patient presents to clinic today to establish care. He is a pleasant 46 year old male who  has a past medical history of Asthma; Chicken pox; Migraines; and Seasonal allergies.  His last physical was when he 5 years ago.   Acute Concerns: Establish Care   Hematuria - was seen in the ER on 5.14.2018. Urine culture was negative. He did not have any instance since until 2 days ago. He reports yesterday he had one episode of hematuria and this morning had one episode of hematuria, described as " pink". He denies any pain with urination, flank pain, fevers, or feeling ill. He lifts a lot of heavy items daily.  Chronic Issues: Asthma - childhood asthma   Migraines - one or two migraines a month.   Health Maintenance: Dental -- Does not do routine care Vision -- Does not do routine care Immunizations -- Tdap    Past Medical History:  Diagnosis Date  . Asthma   . Chicken pox   . Migraines   . Seasonal allergies     History reviewed. No pertinent surgical history.  No current outpatient prescriptions on file prior to visit.   No current facility-administered medications on file prior to visit.     Allergies  Allergen Reactions  . Banana     Itching, dyspnea and nausea  . Pork-Derived Products     Headache, diarrhea and vomiting    Family History  Problem Relation Age of Onset  . Hypertension Mother   . Diabetes Mother   . Stroke Father   . Colon cancer Maternal Aunt     Social History   Social History  . Marital status: Married    Spouse name: N/A  . Number of children: N/A  . Years of education: N/A   Occupational History  . Not on file.   Social History Main Topics  . Smoking status: Never Smoker  . Smokeless tobacco: Never Used  . Alcohol use No  . Drug use: No  . Sexual activity: Not on file   Other Topics Concern  . Not on file   Social History Narrative  . No narrative on file    Review of Systems  Constitutional: Negative.   HENT:  Negative.   Eyes: Negative.   Respiratory: Negative.   Cardiovascular: Negative.   Gastrointestinal: Negative.   Genitourinary: Positive for hematuria.  Musculoskeletal: Negative.   Skin: Negative.   Neurological: Negative.   Endo/Heme/Allergies: Negative.   Psychiatric/Behavioral: Negative.   All other systems reviewed and are negative.   BP 124/70 (BP Location: Left Arm, Patient Position: Sitting, Cuff Size: Normal)   Temp 98.1 F (36.7 C) (Oral)   Ht 5\' 10"  (1.778 m)   Wt 189 lb 6.4 oz (85.9 kg)   BMI 27.18 kg/m   Physical Exam  Constitutional: He is oriented to person, place, and time and well-developed, well-nourished, and in no distress. No distress.  HENT:  Head: Normocephalic and atraumatic.  Right Ear: External ear normal.  Left Ear: External ear normal.  Nose: Nose normal.  Mouth/Throat: Oropharynx is clear and moist. No oropharyngeal exudate.  Eyes: Conjunctivae and EOM are normal. Pupils are equal, round, and reactive to light. Right eye exhibits no discharge. Left eye exhibits no discharge.  Neck: Normal range of motion. Neck supple. No JVD present. No tracheal deviation present. No thyromegaly present.  Cardiovascular: Normal rate, regular rhythm, normal heart sounds and intact distal pulses.  Exam reveals no gallop and no friction rub.  No murmur heard. Pulmonary/Chest: Effort normal and breath sounds normal. No stridor. No respiratory distress. He has no wheezes. He has no rales. He exhibits no tenderness.  Abdominal: Soft. Normal appearance and bowel sounds are normal. He exhibits no distension and no mass. There is no tenderness. There is no rebound and no guarding. No hernia.  Musculoskeletal: Normal range of motion. He exhibits no edema, tenderness or deformity.  Lymphadenopathy:    He has no cervical adenopathy.  Neurological: He is alert and oriented to person, place, and time. He has normal reflexes. He displays normal reflexes. No cranial nerve deficit.  He exhibits normal muscle tone. Gait normal. Coordination normal. GCS score is 15.  Skin: Skin is warm and dry. No rash noted. He is not diaphoretic. No erythema. No pallor.  Psychiatric: Mood, memory, affect and judgment normal.  Nursing note and vitals reviewed.  Assessment/Plan: 1. Encounter to establish care - Tdap given  - Follow up for CPE   2. Hematuria, unspecified type - Not consistent with kidney stones but will get UA with reflux. No hernia. Possibly from heavy lifting.  - POC Urinalysis Dipstick - Urinalysis with Reflex Microscopic - Consider referral to urology   Dorothyann Peng, NP

## 2016-12-16 NOTE — Addendum Note (Signed)
Addended by: Sandria Bales B on: 12/16/2016 02:15 PM   Modules accepted: Orders

## 2016-12-21 ENCOUNTER — Encounter: Payer: Self-pay | Admitting: Adult Health

## 2016-12-21 ENCOUNTER — Ambulatory Visit (INDEPENDENT_AMBULATORY_CARE_PROVIDER_SITE_OTHER): Payer: BLUE CROSS/BLUE SHIELD | Admitting: Adult Health

## 2016-12-21 VITALS — BP 102/70 | Temp 98.3°F | Ht 70.0 in | Wt 185.8 lb

## 2016-12-21 DIAGNOSIS — Z0000 Encounter for general adult medical examination without abnormal findings: Secondary | ICD-10-CM

## 2016-12-21 DIAGNOSIS — R7989 Other specified abnormal findings of blood chemistry: Secondary | ICD-10-CM

## 2016-12-21 LAB — POC URINALSYSI DIPSTICK (AUTOMATED)
Bilirubin, UA: NEGATIVE
Glucose, UA: NEGATIVE
KETONES UA: NEGATIVE
Leukocytes, UA: NEGATIVE
Nitrite, UA: NEGATIVE
PH UA: 6 (ref 5.0–8.0)
PROTEIN UA: NEGATIVE
RBC UA: NEGATIVE
UROBILINOGEN UA: 0.2 U/dL

## 2016-12-21 NOTE — Progress Notes (Signed)
Subjective:    Patient ID: Scott Glenn, male    DOB: 1971/01/13, 46 y.o.   MRN: [id number]  HPI  Patient presents for yearly preventative medicine examination. He is a pleasant 46 year old male who  has a past medical history of Asthma; Chicken pox; Migraines; and Seasonal allergies.  All immunizations and health maintenance protocols were reviewed with the patient and needed orders were placed.  Appropriate screening laboratory values were ordered for the patient including screening of hyperlipidemia, renal function and hepatic function. If indicated by BPH, a PSA was ordered.  Medication reconciliation,  past medical history, social history, problem list and allergies were reviewed in detail with the patient  Goals were established with regard to weight loss, exercise, and  diet in compliance with medications  He does not do routine dental or vision care.   He eats healthy and works as a Engineer, manufacturing systems, so he is always exercising throughout the day.   He denies any episodes of hematuria since the last time I saw him at which time his UA with reflux was negative   Review of Systems  Constitutional: Negative.   HENT: Negative.   Eyes: Negative.   Respiratory: Negative.   Cardiovascular: Negative.   Gastrointestinal: Negative.   Endocrine: Negative.   Genitourinary: Negative.   Musculoskeletal: Negative.   Skin: Negative.   Allergic/Immunologic: Negative.   Neurological: Negative.   Hematological: Negative.   Psychiatric/Behavioral: Negative.   All other systems reviewed and are negative.  Past Medical History:  Diagnosis Date  . Asthma   . Chicken pox   . Migraines   . Seasonal allergies     Social History   Social History  . Marital status: Married    Spouse name: N/A  . Number of children: N/A  . Years of education: N/A   Occupational History  . Not on file.   Social History Main Topics  . Smoking status: Never Smoker  . Smokeless tobacco: Never  Used  . Alcohol use No  . Drug use: No  . Sexual activity: Not on file   Other Topics Concern  . Not on file   Social History Narrative  . No narrative on file    No past surgical history on file.  Family History  Problem Relation Age of Onset  . Hypertension Mother   . Diabetes Mother   . Stroke Father   . Colon cancer Maternal Aunt     Allergies  Allergen Reactions  . Banana     Itching, dyspnea and nausea  . Pork-Derived Products     Headache, diarrhea and vomiting    No current outpatient prescriptions on file prior to visit.   No current facility-administered medications on file prior to visit.     BP 102/70 (BP Location: Left Arm, Patient Position: Sitting, Cuff Size: Normal)   Temp 98.3 F (36.8 C) (Oral)   Ht 5\' 10"  (1.778 m)   Wt 185 lb 12.8 oz (84.3 kg)   BMI 26.66 kg/m       Objective:   Physical Exam  Constitutional: He is oriented to person, place, and time. He appears well-developed and well-nourished. No distress.  HENT:  Head: Normocephalic and atraumatic.  Right Ear: External ear normal.  Left Ear: External ear normal.  Nose: Nose normal.  Mouth/Throat: Oropharynx is clear and moist. No oropharyngeal exudate.  Eyes: Conjunctivae and EOM are normal. Pupils are equal, round, and reactive to light. Right eye exhibits no discharge.  Left eye exhibits no discharge. No scleral icterus.  Neck: Normal range of motion. Neck supple. No JVD present. No tracheal deviation present. No thyromegaly present.  Cardiovascular: Normal rate, regular rhythm, normal heart sounds and intact distal pulses.  Exam reveals no gallop and no friction rub.   No murmur heard. Pulmonary/Chest: Effort normal and breath sounds normal. No stridor. No respiratory distress. He has no wheezes. He has no rales. He exhibits no tenderness.  Abdominal: Soft. Bowel sounds are normal. He exhibits no distension and no mass. There is no tenderness. There is no rebound and no guarding.    Genitourinary: Rectum normal and prostate normal.  Musculoskeletal: Normal range of motion. He exhibits no edema, tenderness or deformity.  Lymphadenopathy:    He has no cervical adenopathy.  Neurological: He is alert and oriented to person, place, and time. He has normal reflexes. He displays normal reflexes. No cranial nerve deficit. He exhibits normal muscle tone. Coordination normal.  Skin: Skin is warm and dry. No rash noted. He is not diaphoretic. No erythema. No pallor.  Psychiatric: He has a normal mood and affect. His behavior is normal. Judgment and thought content normal.  Nursing note and vitals reviewed.     Assessment & Plan:  1. Routine general medical examination at a health care facility - Benign exam. Healthy male. Educated on the importance of getting routine dental and vision exams.  - Basic metabolic panel - CBC with Differential/Platelet - Hepatic function panel - Lipid panel - POCT Urinalysis Dipstick (Automated) - TSH - PSA - Hemoglobin A1c  Dorothyann Peng, NP

## 2016-12-22 LAB — BASIC METABOLIC PANEL
BUN: 21 mg/dL (ref 6–23)
CHLORIDE: 105 meq/L (ref 96–112)
CO2: 24 mEq/L (ref 19–32)
Calcium: 8.9 mg/dL (ref 8.4–10.5)
Creatinine, Ser: 1.2 mg/dL (ref 0.40–1.50)
GFR: 84.17 mL/min (ref >60.00)
Glucose, Bld: 82 mg/dL (ref 70–99)
POTASSIUM: 4.3 meq/L (ref 3.5–5.1)
Sodium: 137 mEq/L (ref 135–145)

## 2016-12-22 LAB — HEPATIC FUNCTION PANEL
ALT: 33 U/L (ref 0–53)
AST: 42 U/L — ABNORMAL HIGH (ref 0–37)
Albumin: 4.1 g/dL (ref 3.5–5.2)
Alkaline Phosphatase: 48 U/L (ref 39–117)
Bilirubin, Direct: 0.2 mg/dL (ref 0.0–0.3)
TOTAL PROTEIN: 5.8 g/dL — AB (ref 6.0–8.3)
Total Bilirubin: 1.5 mg/dL — ABNORMAL HIGH (ref 0.2–1.2)

## 2016-12-22 LAB — CBC WITH DIFFERENTIAL/PLATELET
BASOS PCT: 2.4 % (ref 0.0–3.0)
Basophils Absolute: 0.1 10*3/uL (ref 0.0–0.1)
EOS PCT: 5.5 % — AB (ref 0.0–5.0)
Eosinophils Absolute: 0.3 10*3/uL (ref 0.0–0.7)
HEMATOCRIT: 44.8 % (ref 39.0–52.0)
HEMOGLOBIN: 15.5 g/dL (ref 13.0–17.0)
LYMPHS PCT: 31.8 % (ref 12.0–46.0)
Lymphs Abs: 1.6 10*3/uL (ref 0.7–4.0)
MCHC: 34.6 g/dL (ref 30.0–36.0)
MCV: 87.8 fl (ref 78.0–100.0)
Monocytes Absolute: 0.5 10*3/uL (ref 0.1–1.0)
Monocytes Relative: 10.8 % (ref 3.0–12.0)
Neutro Abs: 2.5 10*3/uL (ref 1.4–7.7)
Neutrophils Relative %: 49.5 % (ref 43.0–77.0)
Platelets: 265 10*3/uL (ref 150.0–400.0)
RBC: 5.1 Mil/uL (ref 4.22–5.81)
RDW: 12.9 % (ref 11.5–15.5)
WBC: 5.1 10*3/uL (ref 4.0–10.5)

## 2016-12-22 LAB — TSH: TSH: 1.62 u[IU]/mL (ref 0.35–4.50)

## 2016-12-22 LAB — LIPID PANEL
CHOLESTEROL: 192 mg/dL (ref 0–200)
HDL: 43.8 mg/dL (ref >39.00)
NONHDL: 148.69
Total CHOL/HDL Ratio: 4
Triglycerides: 211 mg/dL — ABNORMAL HIGH (ref 0.0–149.0)
VLDL: 42.2 mg/dL — ABNORMAL HIGH (ref 0.0–40.0)

## 2016-12-22 LAB — LDL CHOLESTEROL, DIRECT: LDL DIRECT: 105 mg/dL

## 2016-12-22 LAB — PSA: PSA: 0.6 ng/mL (ref 0.10–4.00)

## 2016-12-22 LAB — HEMOGLOBIN A1C: HEMOGLOBIN A1C: 5 % (ref 4.6–6.5)

## 2019-08-13 ENCOUNTER — Other Ambulatory Visit: Payer: Self-pay

## 2019-08-13 ENCOUNTER — Encounter (HOSPITAL_COMMUNITY): Payer: Self-pay

## 2019-08-13 ENCOUNTER — Ambulatory Visit (HOSPITAL_COMMUNITY)
Admission: EM | Admit: 2019-08-13 | Discharge: 2019-08-13 | Disposition: A | Discharge status: Home / Self Care | Payer: Medicaid Other | Attending: Family Medicine | Admitting: Family Medicine

## 2019-08-13 DIAGNOSIS — S93402A Sprain of unspecified ligament of left ankle, initial encounter: Secondary | ICD-10-CM

## 2019-08-13 MED ORDER — NAPROXEN 500 MG PO TABS: 500.0000 mg | ORAL_TABLET | Freq: Two times a day (BID) | ORAL | 0 refills | Status: DC

## 2019-08-13 NOTE — ED Provider Notes (Signed)
[facility]    CSN: [id number] Arrival date & time: 08/13/19  [id number]      History   Chief Complaint Chief Complaint  Patient presents with  . Ankle Pain    HPI Scott Glenn is a 49 y.o. male.   Patient is a 49 year old male presents today with left ankle pain.  Ports this morning he stepped over a piece of ice and twisted inverting his left ankle.  Since he has had pain to medial and lateral aspect of ankle.  Mild swelling.  No numbness, tingling.  Pain with ambulation but able to ambulate.  Has not taken anything or done anything to treat his symptoms.  ROS per HPI      Past Medical History:  Diagnosis Date  . Asthma   . Chicken pox   . Migraines   . Seasonal allergies     Patient Active Problem List   Diagnosis Date Noted  . ALLERGIC RHINITIS WITH CONJUNCTIVITIS 05/02/2008  . ASTHMA 05/02/2008  . GASTRITIS 03/03/2007    History reviewed. No pertinent surgical history.     Home Medications    Prior to Admission medications   Medication Sig Start Date End Date Taking? Authorizing Provider  naproxen (NAPROSYN) 500 MG tablet Take 1 tablet (500 mg total) by mouth 2 (two) times daily. 08/13/19   Orvan July, NP    Family History Family History  Problem Relation Age of Onset  . Hypertension Mother   . Diabetes Mother   . Stroke Father   . Colon cancer Maternal Aunt     Social History Social History   Tobacco Use  . Smoking status: Never Smoker  . Smokeless tobacco: Never Used  Substance Use Topics  . Alcohol use: No  . Drug use: No     Allergies   Banana and Pork-derived products   Review of Systems Review of Systems   Physical Exam Triage Vital Signs ED Triage Vitals  Enc Vitals Group     BP 08/13/19 0908 137/90     Pulse Rate 08/13/19 0908 (!) 55     Resp 08/13/19 0908 17     Temp 08/13/19 0908 98.7 F (37.1 C)     Temp Source 08/13/19 0908 Oral     SpO2 08/13/19 0908 99 %     Weight --      Height --      Head  Circumference --      Peak Flow --      Pain Score 08/13/19 0906 7     Pain Loc --      Pain Edu? --      Excl. in [place]? --    No data found.  Updated Vital Signs BP 137/90 (BP Location: Right Arm)   Pulse (!) 55   Temp 98.7 F (37.1 C) (Oral)   Resp 17   SpO2 99%   Visual Acuity Right Eye Distance:   Left Eye Distance:   Bilateral Distance:    Right Eye Near:   Left Eye Near:    Bilateral Near:     Physical Exam Vitals and nursing note reviewed.  Constitutional:      Appearance: Normal appearance.  HENT:     Head: Normocephalic and atraumatic.     Nose: Nose normal.  Eyes:     Conjunctiva/sclera: Conjunctivae normal.  Pulmonary:     Effort: Pulmonary effort is normal.  Musculoskeletal:     Cervical back: Normal range of motion.  Left ankle: Swelling present. No deformity, ecchymosis or lacerations. Tenderness present over the lateral malleolus and medial malleolus. Decreased range of motion.     Left Achilles Tendon: Normal.     Comments: Limited range of motion of left ankle. 2+ pedal pulse  Skin:    General: Skin is warm and dry.  Neurological:     Mental Status: He is alert.  Psychiatric:        Mood and Affect: Mood normal.      UC Treatments / Results  Labs (all labs ordered are listed, but only abnormal results are displayed) Labs Reviewed - No data to display  EKG   Radiology No results found.  Procedures Procedures (including critical care time)  Medications Ordered in UC Medications - No data to display  Initial Impression / Assessment and Plan / UC Course  I have reviewed the triage vital signs and the nursing notes.  Pertinent labs & imaging results that were available during my care of the patient were reviewed by me and considered in my medical decision making (see chart for details).     Sprain of left ankle-rest, ice, elevate and Ace wrap for compression Naproxen twice a day for pain inflammation Follow up as needed for  continued or worsening symptoms Final Clinical Impressions(s) / UC Diagnoses   Final diagnoses:  Sprain of left ankle, unspecified ligament, initial encounter     Discharge Instructions     Most likely about ankle sprain.  Rest, ice and elevate the ankle Naproxen twice a day for pain and inflammation Ace wrap for compression  Follow up as needed for continued or worsening symptoms     ED Prescriptions    Medication Sig Dispense Auth. Provider   naproxen (NAPROSYN) 500 MG tablet Take 1 tablet (500 mg total) by mouth 2 (two) times daily. 30 tablet Loura Halt A, NP     PDMP not reviewed this encounter.   Loura Halt A, NP 08/13/19 [phone]

## 2019-08-13 NOTE — ED Triage Notes (Signed)
Pt reports he stepped over a piece of ice and twisted his left ankle.

## 2019-08-13 NOTE — Discharge Instructions (Addendum)
Most likely about ankle sprain.  Rest, ice and elevate the ankle Naproxen twice a day for pain and inflammation Ace wrap for compression  Follow up as needed for continued or worsening symptoms

## 2019-11-12 ENCOUNTER — Encounter (HOSPITAL_COMMUNITY): Payer: Self-pay | Admitting: Emergency Medicine

## 2019-11-12 ENCOUNTER — Other Ambulatory Visit: Payer: Self-pay

## 2019-11-12 ENCOUNTER — Emergency Department (HOSPITAL_COMMUNITY)
Admission: EM | Admit: 2019-11-12 | Discharge: 2019-11-12 | Disposition: A | Discharge status: Home / Self Care | Payer: Medicaid Other | Attending: Emergency Medicine | Admitting: Emergency Medicine

## 2019-11-12 DIAGNOSIS — Z20822 Contact with and (suspected) exposure to covid-19: Secondary | ICD-10-CM | POA: Insufficient documentation

## 2019-11-12 DIAGNOSIS — K529 Noninfective gastroenteritis and colitis, unspecified: Secondary | ICD-10-CM | POA: Insufficient documentation

## 2019-11-12 DIAGNOSIS — R1030 Lower abdominal pain, unspecified: Secondary | ICD-10-CM | POA: Diagnosis present

## 2019-11-12 DIAGNOSIS — J45909 Unspecified asthma, uncomplicated: Secondary | ICD-10-CM | POA: Insufficient documentation

## 2019-11-12 LAB — URINALYSIS, ROUTINE W REFLEX MICROSCOPIC
Bilirubin Urine: NEGATIVE
Glucose, UA: NEGATIVE mg/dL
Hgb urine dipstick: NEGATIVE
Ketones, ur: NEGATIVE mg/dL
Leukocytes,Ua: NEGATIVE
Nitrite: NEGATIVE
Protein, ur: NEGATIVE mg/dL
Specific Gravity, Urine: 1.023 (ref 1.005–1.030)
pH: 7 (ref 5.0–8.0)

## 2019-11-12 LAB — COMPREHENSIVE METABOLIC PANEL
ALT: 30 U/L (ref 0–44)
AST: 39 U/L (ref 15–41)
Albumin: 4 g/dL (ref 3.5–5.0)
Alkaline Phosphatase: 61 U/L (ref 38–126)
Anion gap: 10 (ref 5–15)
BUN: 10 mg/dL (ref 6–20)
CO2: 22 mmol/L (ref 22–32)
Calcium: 8.6 mg/dL — ABNORMAL LOW (ref 8.9–10.3)
Chloride: 110 mmol/L (ref 98–111)
Creatinine, Ser: 0.93 mg/dL (ref 0.61–1.24)
GFR calc Af Amer: >60 mL/min (ref >60)
GFR calc non Af Amer: >60 mL/min (ref >60)
Glucose, Bld: 101 mg/dL — ABNORMAL HIGH (ref 70–99)
Potassium: 4.5 mmol/L (ref 3.5–5.1)
Sodium: 142 mmol/L (ref 135–145)
Total Bilirubin: 1.4 mg/dL — ABNORMAL HIGH (ref 0.3–1.2)
Total Protein: 6.6 g/dL (ref 6.5–8.1)

## 2019-11-12 LAB — CBC
HCT: 40.3 % (ref 39.0–52.0)
Hemoglobin: 13.2 g/dL (ref 13.0–17.0)
MCH: 30.8 pg (ref 26.0–34.0)
MCHC: 32.8 g/dL (ref 30.0–36.0)
MCV: 94.2 fL (ref 80.0–100.0)
Platelets: 234 10*3/uL (ref 150–400)
RBC: 4.28 MIL/uL (ref 4.22–5.81)
RDW: 11.8 % (ref 11.5–15.5)
WBC: 4.5 10*3/uL (ref 4.0–10.5)
nRBC: 0 % (ref 0.0–0.2)

## 2019-11-12 LAB — SARS CORONAVIRUS 2 (TAT 6-24 HRS): SARS Coronavirus 2: NEGATIVE

## 2019-11-12 LAB — LIPASE, BLOOD: Lipase: 40 U/L (ref 11–51)

## 2019-11-12 MED ORDER — SODIUM CHLORIDE 0.9% FLUSH
3.0000 mL | Freq: Once | INTRAVENOUS | Status: AC
  Administered 2019-11-12: 3 mL via INTRAVENOUS

## 2019-11-12 MED ORDER — SODIUM CHLORIDE 0.9 % IV SOLN
1000.0000 mL | INTRAVENOUS | Status: DC
  Administered 2019-11-12: 1000 mL via INTRAVENOUS

## 2019-11-12 MED ORDER — ONDANSETRON HCL 4 MG/2ML IJ SOLN
4.0000 mg | Freq: Once | INTRAMUSCULAR | Status: AC
  Administered 2019-11-12: 4 mg via INTRAVENOUS
  Filled 2019-11-12: qty 2

## 2019-11-12 MED ORDER — SODIUM CHLORIDE 0.9 % IV BOLUS (SEPSIS)
1000.0000 mL | Freq: Once | INTRAVENOUS | Status: AC
  Administered 2019-11-12: 1000 mL via INTRAVENOUS

## 2019-11-12 MED ORDER — ONDANSETRON 8 MG PO TBDP: 8.0000 mg | ORAL_TABLET | Freq: Three times a day (TID) | ORAL | 0 refills | Status: DC | PRN

## 2019-11-12 MED ORDER — KETOROLAC TROMETHAMINE 30 MG/ML IJ SOLN
30.0000 mg | Freq: Once | INTRAMUSCULAR | Status: AC
  Administered 2019-11-12: 30 mg via INTRAVENOUS
  Filled 2019-11-12: qty 1

## 2019-11-12 NOTE — ED Provider Notes (Signed)
[facility] EMERGENCY DEPARTMENT Provider Note   CSN: [id number] Arrival date & time: 11/12/19  [id number]     History Chief Complaint  Patient presents with  . Emesis  . Abdominal Pain  . Diarrhea    Scott Glenn is a 49 y.o. male.  HPI   Patient presents emergency room for evaluation of vomiting and diarrhea.  Patient states his symptoms started last evening.  His daughter is here in the emergency room with similar symptoms.  She started having symptoms this morning.  They both went out to eat at [employer] yesterday.  Patient's son was also with him and he is not having any symptoms right now.  Patient denies any fevers or chills.  He is having abdominal cramping in the upper and lower abdomen.  He has had multiple episodes of diarrhea at least for this morning as well as a few episodes of emesis.  No blood in his stool or emesis.  He is feeling lightheaded this morning.  Patient denies any history of intestinal problems.  Past Medical History:  Diagnosis Date  . Asthma   . Chicken pox   . Migraines   . Seasonal allergies     Patient Active Problem List   Diagnosis Date Noted  . ALLERGIC RHINITIS WITH CONJUNCTIVITIS 05/02/2008  . ASTHMA 05/02/2008  . GASTRITIS 03/03/2007    History reviewed. No pertinent surgical history.     Family History  Problem Relation Age of Onset  . Hypertension Mother   . Diabetes Mother   . Stroke Father   . Colon cancer Maternal Aunt     Social History   Tobacco Use  . Smoking status: Never Smoker  . Smokeless tobacco: Never Used  Substance Use Topics  . Alcohol use: No  . Drug use: No    Home Medications Prior to Admission medications   Medication Sig Start Date End Date Taking? Authorizing Provider  Multiple Vitamin (MULTIVITAMIN ADULT PO) Take 1 tablet by mouth daily.   Yes [provider]  naproxen (NAPROSYN) 500 MG tablet Take 1 tablet (500 mg total) by mouth 2 (two) times daily. Patient not  taking: Reported on 11/12/2019 08/13/19   Loura Halt A, NP  ondansetron (ZOFRAN ODT) 8 MG disintegrating tablet Take 1 tablet (8 mg total) by mouth every 8 (eight) hours as needed for nausea or vomiting. 11/12/19   Dorie Rank, MD    Allergies    Banana and Pork-derived products  Review of Systems   Review of Systems  All other systems reviewed and are negative.   Physical Exam Updated Vital Signs BP (!) 124/91   Pulse (!) 49   Temp 98.5 F (36.9 C) (Oral)   Resp 16   Ht 1.753 m (5\' 9" )   Wt 79.4 kg   SpO2 99%   BMI 25.84 kg/m   Physical Exam Vitals and nursing note reviewed.  Constitutional:      General: He is not in acute distress.    Appearance: He is well-developed.  HENT:     Head: Normocephalic and atraumatic.     Right Ear: External ear normal.     Left Ear: External ear normal.  Eyes:     General: No scleral icterus.       Right eye: No discharge.        Left eye: No discharge.     Conjunctiva/sclera: Conjunctivae normal.  Neck:     Trachea: No tracheal deviation.  Cardiovascular:  Rate and Rhythm: Normal rate and regular rhythm.  Pulmonary:     Effort: Pulmonary effort is normal. No respiratory distress.     Breath sounds: Normal breath sounds. No stridor. No wheezing or rales.  Abdominal:     General: Bowel sounds are normal. There is no distension.     Palpations: Abdomen is soft.     Tenderness: There is generalized abdominal tenderness. There is no guarding or rebound.     Comments: mild  Musculoskeletal:        General: No tenderness.     Cervical back: Neck supple.  Skin:    General: Skin is warm and dry.     Findings: No rash.  Neurological:     Mental Status: He is alert.     Cranial Nerves: No cranial nerve deficit (no facial droop, extraocular movements intact, no slurred speech).     Sensory: No sensory deficit.     Motor: No abnormal muscle tone or seizure activity.     Coordination: Coordination normal.     ED Results /  Procedures / Treatments   Labs (all labs ordered are listed, but only abnormal results are displayed) Labs Reviewed  COMPREHENSIVE METABOLIC PANEL - Abnormal; Notable for the following components:      Result Value   Glucose, Bld 101 (*)    Calcium 8.6 (*)    Total Bilirubin 1.4 (*)    All other components within normal limits  SARS CORONAVIRUS 2 (TAT 6-24 HRS)  LIPASE, BLOOD  CBC  URINALYSIS, ROUTINE W REFLEX MICROSCOPIC    EKG None  Radiology No results found.  Procedures Procedures (including critical care time)  Medications Ordered in ED Medications  sodium chloride 0.9 % bolus 1,000 mL (0 mLs Intravenous Stopped 11/12/19 0933)    Followed by  0.9 %  sodium chloride infusion (1,000 mLs Intravenous New Bag/Given 11/12/19 0936)  sodium chloride flush (NS) 0.9 % injection 3 mL (3 mLs Intravenous Given 11/12/19 0832)  ondansetron (ZOFRAN) injection 4 mg (4 mg Intravenous Given 11/12/19 0822)  ketorolac (TORADOL) 30 MG/ML injection 30 mg (30 mg Intravenous Given 11/12/19 [id number])    ED Course  I have reviewed the triage vital signs and the nursing notes.  Pertinent labs & imaging results that were available during my care of the patient were reviewed by me and considered in my medical decision making (see chart for details).    MDM Rules/Calculators/A&P                      Patient presented to the ED for evaluation of vomiting and diarrhea.  Patient's daughter had similar symptoms.  His ED work-up is reassuring.  Abdominal exam is benign.  No peritoneal signs.  Laboratory tests do not show any significant abnormalities.  I doubt appendicitis, colitis, diverticulitis or other serious etiology.  Symptoms most likely related to either a foodborne illness or a viral gastroenteritis.  Patient was treated with IV fluids and antiemetics.  He is feeling much better.  No further vomiting.  Patient is stable for discharge.  Patient did request a Covid test while he is here.  His symptoms  are not suggestive of Covid but we will go ahead and proceed with that testing as requested Final Clinical Impression(s) / ED Diagnoses Final diagnoses:  Gastroenteritis    Rx / DC Orders ED Discharge Orders         Ordered    ondansetron (ZOFRAN ODT) 8 MG disintegrating tablet  Every 8 hours PRN     11/12/19 1135           Dorie Rank, MD 11/12/19 1137

## 2019-11-12 NOTE — Discharge Instructions (Addendum)
Take the medications as needed for nausea.  You could also take over-the-counter Imodium to help with diarrhea.  Your symptoms should resolve over the next day or 2 return to the ED for high fevers, severe pain or other concerning symptoms.

## 2019-11-12 NOTE — ED Triage Notes (Addendum)
Pt in with generalized abdominal cramping, n/v/d since eating at [employer] yesterday. States emesis x 2, diarrhea multiple times. No known sick contacts.  Endorses frequent marijuana use

## 2019-11-14 ENCOUNTER — Emergency Department (HOSPITAL_COMMUNITY): Payer: Medicaid Other

## 2019-11-14 ENCOUNTER — Encounter (HOSPITAL_COMMUNITY): Payer: Self-pay | Admitting: *Deleted

## 2019-11-14 ENCOUNTER — Emergency Department (HOSPITAL_COMMUNITY)
Admission: EM | Admit: 2019-11-14 | Discharge: 2019-11-14 | Disposition: A | Discharge status: Home / Self Care | Payer: Medicaid Other | Attending: Emergency Medicine | Admitting: Emergency Medicine

## 2019-11-14 ENCOUNTER — Other Ambulatory Visit: Payer: Self-pay

## 2019-11-14 DIAGNOSIS — J45909 Unspecified asthma, uncomplicated: Secondary | ICD-10-CM | POA: Insufficient documentation

## 2019-11-14 DIAGNOSIS — R112 Nausea with vomiting, unspecified: Secondary | ICD-10-CM | POA: Diagnosis present

## 2019-11-14 DIAGNOSIS — R109 Unspecified abdominal pain: Secondary | ICD-10-CM | POA: Diagnosis not present

## 2019-11-14 DIAGNOSIS — R197 Diarrhea, unspecified: Secondary | ICD-10-CM | POA: Insufficient documentation

## 2019-11-14 DIAGNOSIS — Z79899 Other long term (current) drug therapy: Secondary | ICD-10-CM | POA: Insufficient documentation

## 2019-11-14 LAB — COMPREHENSIVE METABOLIC PANEL
ALT: 31 U/L (ref 0–44)
AST: 38 U/L (ref 15–41)
Albumin: 4.3 g/dL (ref 3.5–5.0)
Alkaline Phosphatase: 52 U/L (ref 38–126)
Anion gap: 13 (ref 5–15)
BUN: <5 mg/dL — ABNORMAL LOW (ref 6–20)
CO2: 27 mmol/L (ref 22–32)
Calcium: 9.1 mg/dL (ref 8.9–10.3)
Chloride: 105 mmol/L (ref 98–111)
Creatinine, Ser: 1.03 mg/dL (ref 0.61–1.24)
GFR calc Af Amer: >60 mL/min (ref >60)
GFR calc non Af Amer: >60 mL/min (ref >60)
Glucose, Bld: 107 mg/dL — ABNORMAL HIGH (ref 70–99)
Potassium: 4.5 mmol/L (ref 3.5–5.1)
Sodium: 145 mmol/L (ref 135–145)
Total Bilirubin: 1.4 mg/dL — ABNORMAL HIGH (ref 0.3–1.2)
Total Protein: 6.9 g/dL (ref 6.5–8.1)

## 2019-11-14 LAB — URINALYSIS, ROUTINE W REFLEX MICROSCOPIC
Bilirubin Urine: NEGATIVE
Glucose, UA: NEGATIVE mg/dL
Hgb urine dipstick: NEGATIVE
Ketones, ur: NEGATIVE mg/dL
Leukocytes,Ua: NEGATIVE
Nitrite: NEGATIVE
Protein, ur: NEGATIVE mg/dL
Specific Gravity, Urine: 1.009 (ref 1.005–1.030)
pH: 9 — ABNORMAL HIGH (ref 5.0–8.0)

## 2019-11-14 LAB — CBC
HCT: 43.2 % (ref 39.0–52.0)
Hemoglobin: 14.5 g/dL (ref 13.0–17.0)
MCH: 31.2 pg (ref 26.0–34.0)
MCHC: 33.6 g/dL (ref 30.0–36.0)
MCV: 92.9 fL (ref 80.0–100.0)
Platelets: 277 10*3/uL (ref 150–400)
RBC: 4.65 MIL/uL (ref 4.22–5.81)
RDW: 11.8 % (ref 11.5–15.5)
WBC: 7 10*3/uL (ref 4.0–10.5)
nRBC: 0 % (ref 0.0–0.2)

## 2019-11-14 LAB — LIPASE, BLOOD: Lipase: 28 U/L (ref 11–51)

## 2019-11-14 MED ORDER — ONDANSETRON HCL 4 MG/2ML IJ SOLN
2.0000 mg | Freq: Once | INTRAMUSCULAR | Status: AC
  Administered 2019-11-14: 2 mg via INTRAVENOUS
  Filled 2019-11-14: qty 2

## 2019-11-14 MED ORDER — IOHEXOL 300 MG/ML  SOLN
100.0000 mL | Freq: Once | INTRAMUSCULAR | Status: AC | PRN
  Administered 2019-11-14: 100 mL via INTRAVENOUS

## 2019-11-14 MED ORDER — SODIUM CHLORIDE 0.9 % IV BOLUS
500.0000 mL | Freq: Once | INTRAVENOUS | Status: AC
  Administered 2019-11-14: 500 mL via INTRAVENOUS

## 2019-11-14 MED ORDER — ONDANSETRON 8 MG PO TBDP: 8.0000 mg | ORAL_TABLET | Freq: Three times a day (TID) | ORAL | 0 refills | Status: DC | PRN

## 2019-11-14 MED ORDER — SODIUM CHLORIDE 0.9 % IV BOLUS
1000.0000 mL | Freq: Once | INTRAVENOUS | Status: AC
  Administered 2019-11-14: 1000 mL via INTRAVENOUS

## 2019-11-14 MED ORDER — ONDANSETRON HCL 4 MG/2ML IJ SOLN: 4.0000 mg | Freq: Once | INTRAMUSCULAR | Status: DC

## 2019-11-14 MED ORDER — SODIUM CHLORIDE 0.9% FLUSH
3.0000 mL | Freq: Once | INTRAVENOUS | Status: AC
  Administered 2019-11-14: 07:00:00 3 mL via INTRAVENOUS

## 2019-11-14 MED ORDER — ONDANSETRON HCL 4 MG/2ML IJ SOLN
4.0000 mg | Freq: Once | INTRAMUSCULAR | Status: AC
  Administered 2019-11-14: 4 mg via INTRAVENOUS
  Filled 2019-11-14: qty 2

## 2019-11-14 NOTE — ED Provider Notes (Signed)
[facility] EMERGENCY DEPARTMENT Provider Note   CSN: [id number] Arrival date & time: 11/14/19  0615     History Chief Complaint  Patient presents with  . Emesis    Scott Glenn is a 49 y.o. male history of allergies, asthma, migraines.  Patient presents today for nausea vomiting diarrhea, onset 4 days ago.  He reports that his daughter was sick with a similar illness at that time.  Patient reports that he came to the emergency department 2 days ago, he reports he had blood work taken and was given fluids and medicine for nausea felt better prior to discharge.  He reports he was discharged with a nausea medication that he did not pick up from the pharmacy.  When he returned home his nausea and vomiting returned.  He reports multiple episodes of nonbloody/nonbilious emesis along with nonbloody diarrhea since discharge 2 days ago.  He reports mild abdominal cramping and generalized nonradiating no clear aggravating factors which is improved after he stops vomiting.  Denies fever/chills, headache, chest pain/shortness of breath, dysuria/hematuria, fall/injury or any additional concerns.  HPI     Past Medical History:  Diagnosis Date  . Asthma   . Chicken pox   . Migraines   . Seasonal allergies     Patient Active Problem List   Diagnosis Date Noted  . ALLERGIC RHINITIS WITH CONJUNCTIVITIS 05/02/2008  . ASTHMA 05/02/2008  . GASTRITIS 03/03/2007    History reviewed. No pertinent surgical history.     Family History  Problem Relation Age of Onset  . Hypertension Mother   . Diabetes Mother   . Stroke Father   . Colon cancer Maternal Aunt     Social History   Tobacco Use  . Smoking status: Never Smoker  . Smokeless tobacco: Never Used  Substance Use Topics  . Alcohol use: No  . Drug use: No    Home Medications Prior to Admission medications   Medication Sig Start Date End Date Taking? Authorizing Provider  Multiple Vitamin (MULTIVITAMIN  ADULT PO) Take 1 tablet by mouth daily.   Yes [provider]  naproxen (NAPROSYN) 500 MG tablet Take 1 tablet (500 mg total) by mouth 2 (two) times daily. Patient not taking: Reported on 11/12/2019 08/13/19   Loura Halt A, NP  ondansetron (ZOFRAN ODT) 8 MG disintegrating tablet Take 1 tablet (8 mg total) by mouth every 8 (eight) hours as needed for nausea or vomiting. 11/12/19   Dorie Rank, MD    Allergies    Banana and Pork-derived products  Review of Systems   Review of Systems Ten systems are reviewed and are negative for acute change except as noted in the HPI  Physical Exam Updated Vital Signs BP (!) 155/94 (BP Location: Left Arm)   Pulse 61   Temp 98.3 F (36.8 C) (Oral)   Resp 18   Ht 5\' 10"  (1.778 m)   Wt 90 kg   SpO2 99%   BMI 28.47 kg/m   Physical Exam Constitutional:      General: He is not in acute distress.    Appearance: Normal appearance. He is well-developed. He is not ill-appearing or diaphoretic.  HENT:     Head: Normocephalic and atraumatic.     Right Ear: External ear normal.     Left Ear: External ear normal.     Nose: Nose normal.  Eyes:     General: Vision grossly intact. Gaze aligned appropriately.     Pupils: Pupils are equal,  round, and reactive to light.  Neck:     Trachea: Trachea and phonation normal. No tracheal deviation.  Cardiovascular:     Rate and Rhythm: Normal rate and regular rhythm.     Pulses:          Dorsalis pedis pulses are 2+ on the right side and 2+ on the left side.  Pulmonary:     Effort: Pulmonary effort is normal. No respiratory distress.  Abdominal:     General: There is no distension.     Palpations: Abdomen is soft.     Tenderness: There is no abdominal tenderness. There is no guarding or rebound. Negative signs include Murphy's sign and McBurney's sign.  Musculoskeletal:        General: Normal range of motion.     Cervical back: Normal range of motion.  Feet:     Right foot:     Protective Sensation: 3  sites tested. 3 sites sensed.     Left foot:     Protective Sensation: 3 sites tested. 3 sites sensed.  Skin:    General: Skin is warm and dry.  Neurological:     Mental Status: He is alert.     GCS: GCS eye subscore is 4. GCS verbal subscore is 5. GCS motor subscore is 6.     Comments: Speech is clear and goal oriented, follows commands Major Cranial nerves without deficit, no facial droop Moves extremities without ataxia, coordination intact  Psychiatric:        Behavior: Behavior normal.     ED Results / Procedures / Treatments   Labs (all labs ordered are listed, but only abnormal results are displayed) Labs Reviewed  CBC  LIPASE, BLOOD  COMPREHENSIVE METABOLIC PANEL  URINALYSIS, ROUTINE W REFLEX MICROSCOPIC    EKG None  Radiology No results found.  Procedures Procedures (including critical care time)  Medications Ordered in ED Medications  sodium chloride flush (NS) 0.9 % injection 3 mL (has no administration in time range)  ondansetron (ZOFRAN) injection 4 mg (4 mg Intravenous Given 11/14/19 0648)  sodium chloride 0.9 % bolus 1,000 mL (1,000 mLs Intravenous New Bag/Given 11/14/19 [id number])    ED Course  I have reviewed the triage vital signs and the nursing notes.  Pertinent labs & imaging results that were available during my care of the patient were reviewed by me and considered in my medical decision making (see chart for details).    MDM Rules/Calculators/A&P                     Additional History Obtained: Nursing notes from this visit. Prior ED visit on Nov 12, 2019 reviewed, diagnosis of gastroenteritis.  Reassuring lab work including CBC, CMP, lipase, urinalysis.  Patient received 2 L of normal saline, 4 mg Zofran and 30 mg of Toradol.  Covid test was negative.  Discharged with Zofran.  Lab Tests: I ordered, reviewed and interpreted labs which include: CBC within normal limits no leukocytosis to suggest infection and no evidence of anemia. Lipase  within normal limits no evidence of pancreatitis. CMP shows total bilirubin of 1.4 no change from prior, no emergent electrolyte abnormalities, elevation of LFTs or acute kidney injury Urinalysis shows no evidence of infection no hemoglobin suggestive of kidney stone.  ED Course: 49 year old male presents with nausea vomiting diarrhea ongoing for 4 days.  He had reassuring work-up in the ED 2 days ago, was discharged with Zofran unfortunately he did not pick up this medication from  the pharmacy.  He reports ongoing nausea and vomiting inability to tolerate p.o. at home along with some diarrhea as well.  No fever or significant abdominal pain.  On evaluation he is tired appearing no acute distress, abdomen is soft nontender and without peritoneal signs.  Patient was given 1.5 mL of fluid as well as 4 mg of Zofran which was given at 6:48 AM.  Patient was p.o. challenged around 1130 around that time he had return of nausea and cannot tolerate p.o.  Since had been around 4-5 hours since previous dose of Zofran additional Zofran was given.  Patient was reassessed at 1 PM, he reports he has had increased abdominal pain generalized and is still unable to tolerate p.o.  Will obtain CT abdomen pelvis to assess for possible intra-abdominal pathology such as appendicitis and monitor patient. - Care handoff given to Domenic Moras PA-C at shift change.  Plan of care is to follow-up on CT abdomen pelvis.  Pending no acute findings and patient tolerating p.o. anticipate discharge.  Patient will need to fill his Zofran prescription.  Note: Portions of this report may have been transcribed using voice recognition software. Every effort was made to ensure accuracy; however, inadvertent computerized transcription errors may still be present. Final Clinical Impression(s) / ED Diagnoses Final diagnoses:  None    Rx / DC Orders ED Discharge Orders    None       Gari Crown 11/14/19 1511    Fredia Sorrow, MD 11/17/19 [phone]

## 2019-11-14 NOTE — ED Triage Notes (Addendum)
N/V/D and generalized abdominal pain since Sunday. Denies fevers. Pt daughter has had similar symptoms. Pt was seen here for the same, says he was feeling better, but then started vomiting/diarrhea again. Has not taken any of the meds prescribed.

## 2019-11-14 NOTE — ED Provider Notes (Signed)
Received signout from the previous providers, please see his note for complete H&P.  This is a 49 year old male presenting with nausea vomiting diarrhea for the past 4 days.  Recent sick exposure.  Today, labs are reassuring, an abdominal pelvis CT scan was obtained showing no acute finding.  At this time patient tolerates p.o. and felt comfortable going home.  Will discharge home with symptomatic treatment, return precaution discussed.  BP (!) 152/111   Pulse (!) 50   Temp 98.3 F (36.8 C) (Oral)   Resp 17   Ht 5\' 10"  (1.778 m)   Wt 90 kg   SpO2 98%   BMI 28.47 kg/m   Results for orders placed or performed during the hospital encounter of 11/14/19  Lipase, blood  Result Value Ref Range   Lipase 28 11 - 51 U/L  Comprehensive metabolic panel  Result Value Ref Range   Sodium 145 135 - 145 mmol/L   Potassium 4.5 3.5 - 5.1 mmol/L   Chloride 105 98 - 111 mmol/L   CO2 27 22 - 32 mmol/L   Glucose, Bld 107 (H) 70 - 99 mg/dL   BUN <5 (L) 6 - 20 mg/dL   Creatinine, Ser 1.03 0.61 - 1.24 mg/dL   Calcium 9.1 8.9 - 10.3 mg/dL   Total Protein 6.9 6.5 - 8.1 g/dL   Albumin 4.3 3.5 - 5.0 g/dL   AST 38 15 - 41 U/L   ALT 31 0 - 44 U/L   Alkaline Phosphatase 52 38 - 126 U/L   Total Bilirubin 1.4 (H) 0.3 - 1.2 mg/dL   GFR calc non Af Amer >60 >60 mL/min   GFR calc Af Amer >60 >60 mL/min   Anion gap 13 5 - 15  CBC  Result Value Ref Range   WBC 7.0 4.0 - 10.5 K/uL   RBC 4.65 4.22 - 5.81 MIL/uL   Hemoglobin 14.5 13.0 - 17.0 g/dL   HCT 43.2 39.0 - 52.0 %   MCV 92.9 80.0 - 100.0 fL   MCH 31.2 26.0 - 34.0 pg   MCHC 33.6 30.0 - 36.0 g/dL   RDW 11.8 11.5 - 15.5 %   Platelets 277 150 - 400 K/uL   nRBC 0.0 0.0 - 0.2 %  Urinalysis, Routine w reflex microscopic  Result Value Ref Range   Color, Urine YELLOW YELLOW   APPearance CLEAR CLEAR   Specific Gravity, Urine 1.009 1.005 - 1.030   pH 9.0 (H) 5.0 - 8.0   Glucose, UA NEGATIVE NEGATIVE mg/dL   Hgb urine dipstick NEGATIVE NEGATIVE   Bilirubin  Urine NEGATIVE NEGATIVE   Ketones, ur NEGATIVE NEGATIVE mg/dL   Protein, ur NEGATIVE NEGATIVE mg/dL   Nitrite NEGATIVE NEGATIVE   Leukocytes,Ua NEGATIVE NEGATIVE   CT ABDOMEN PELVIS W CONTRAST  Result Date: 11/14/2019 CLINICAL DATA:  Vomiting. EXAM: CT ABDOMEN AND PELVIS WITH CONTRAST TECHNIQUE: Multidetector CT imaging of the abdomen and pelvis was performed using the standard protocol following bolus administration of intravenous contrast. CONTRAST:  110mL OMNIPAQUE IOHEXOL 300 MG/ML  SOLN COMPARISON:  None. FINDINGS: Lower chest: No acute abnormality. Hepatobiliary: No focal liver abnormality is seen. No gallstones, gallbladder wall thickening, or biliary dilatation. Pancreas: Unremarkable. No pancreatic ductal dilatation or surrounding inflammatory changes. Spleen: Normal in size without focal abnormality. Adrenals/Urinary Tract: Adrenal glands are unremarkable. Kidneys are normal, without renal calculi, focal lesion, or hydronephrosis. Bladder is unremarkable. Stomach/Bowel: Stomach is within normal limits. Appendix appears normal. No evidence of bowel wall thickening, distention, or inflammatory  changes. Vascular/Lymphatic: No significant vascular findings are present. No enlarged abdominal or pelvic lymph nodes. Reproductive: Prostate is unremarkable. Other: No abdominal wall hernia or abnormality. No abdominopelvic ascites. Musculoskeletal: No acute or significant osseous findings. IMPRESSION: No acute findings in the abdomen or pelvis. Electronically Signed   By: Virgina Norfolk M.D.   On: 11/14/2019 16:39      Domenic Moras, PA-C 11/14/19 1658    Charlesetta Shanks, MD 11/16/19 [phone]

## 2019-11-14 NOTE — ED Notes (Signed)
Pt only ate one bite of his sandwich for his PO challenge & did not want any more. After receiving the full amount of 1,500 cc Bolus of NS he states that he feels better & wants to leave. EDP made aware.

## 2019-11-14 NOTE — ED Notes (Signed)
Patient aware that we need urine sample for testing, unable at this time. Pt given instruction on providing urine sample when able to do so.   

## 2020-04-04 ENCOUNTER — Encounter (HOSPITAL_COMMUNITY): Payer: Self-pay

## 2020-04-04 ENCOUNTER — Ambulatory Visit (HOSPITAL_COMMUNITY)
Admission: EM | Admit: 2020-04-04 | Discharge: 2020-04-04 | Disposition: A | Discharge status: Home / Self Care | Payer: Medicaid Other | Attending: Physician Assistant | Admitting: Physician Assistant

## 2020-04-04 ENCOUNTER — Other Ambulatory Visit: Payer: Self-pay

## 2020-04-04 DIAGNOSIS — S46819A Strain of other muscles, fascia and tendons at shoulder and upper arm level, unspecified arm, initial encounter: Secondary | ICD-10-CM

## 2020-04-04 MED ORDER — METHOCARBAMOL 500 MG PO TABS: 500.0000 mg | ORAL_TABLET | Freq: Four times a day (QID) | ORAL | 0 refills | Status: DC

## 2020-04-04 MED ORDER — DICLOFENAC SODIUM 75 MG PO TBEC: 75.0000 mg | DELAYED_RELEASE_TABLET | Freq: Two times a day (BID) | ORAL | 0 refills | Status: DC

## 2020-04-04 NOTE — ED Triage Notes (Signed)
Pt presents with right side neck and shoulder pain after lifting a heavy object X 3 days ago.

## 2020-04-04 NOTE — Discharge Instructions (Signed)
Return if any problems.

## 2020-06-19 ENCOUNTER — Ambulatory Visit (HOSPITAL_COMMUNITY)
Admission: EM | Admit: 2020-06-19 | Discharge: 2020-06-19 | Disposition: A | Discharge status: Home / Self Care | Payer: Medicaid Other | Attending: Family Medicine | Admitting: Family Medicine

## 2020-06-19 ENCOUNTER — Encounter (HOSPITAL_COMMUNITY): Payer: Self-pay

## 2020-06-19 ENCOUNTER — Other Ambulatory Visit: Payer: Self-pay

## 2020-06-19 DIAGNOSIS — Z20822 Contact with and (suspected) exposure to covid-19: Secondary | ICD-10-CM | POA: Insufficient documentation

## 2020-06-19 DIAGNOSIS — R197 Diarrhea, unspecified: Secondary | ICD-10-CM | POA: Diagnosis present

## 2020-06-19 LAB — SARS CORONAVIRUS 2 (TAT 6-24 HRS): SARS Coronavirus 2: NEGATIVE

## 2020-06-19 NOTE — ED Triage Notes (Signed)
Pt presents with generalized abdominal pain and diarrhea since yesterday.

## 2020-06-19 NOTE — Discharge Instructions (Signed)
May take over the counter pepto bismol or imodium if needed Drink plenty of water Check My Chart for the test result You will be called if your COVID test is positive

## 2020-06-19 NOTE — ED Provider Notes (Signed)
[place]    CSN: [id number] Arrival date & time: 06/19/20  5732      History   Chief Complaint Chief Complaint  Patient presents with  . Diarrhea  . Abdominal Pain    HPI Scott Glenn is a 49 y.o. male.   HPI   Patient is usually in good health.  He started to have diarrhea yesterday.  Except for watery bowel movements today.  Some abdominal cramping.  No nausea or vomiting.  He is keeping up with his fluids.  He feels somewhat achy.  Has not taken his temperature.  Has not been exposed to any illness.  Has not eaten any food that did not agree with him.  No one else at home or work is sick  Past Medical History:  Diagnosis Date  . Asthma   . Chicken pox   . Migraines   . Seasonal allergies     Patient Active Problem List   Diagnosis Date Noted  . ALLERGIC RHINITIS WITH CONJUNCTIVITIS 05/02/2008  . ASTHMA 05/02/2008  . GASTRITIS 03/03/2007    History reviewed. No pertinent surgical history.     Home Medications    Prior to Admission medications   Medication Sig Start Date End Date Taking? Authorizing Provider  diclofenac (VOLTAREN) 75 MG EC tablet Take 1 tablet (75 mg total) by mouth 2 (two) times daily. 04/04/20   Fransico Meadow, PA-C  methocarbamol (ROBAXIN) 500 MG tablet Take 1 tablet (500 mg total) by mouth 4 (four) times daily. 04/04/20   Fransico Meadow, PA-C  Multiple Vitamin (MULTIVITAMIN ADULT PO) Take 1 tablet by mouth daily.    [provider]  ondansetron (ZOFRAN ODT) 8 MG disintegrating tablet Take 1 tablet (8 mg total) by mouth every 8 (eight) hours as needed for nausea or vomiting. 11/14/19   Domenic Moras, PA-C    Family History Family History  Problem Relation Age of Onset  . Hypertension Mother   . Diabetes Mother   . Stroke Father   . Colon cancer Maternal Aunt     Social History Social History   Tobacco Use  . Smoking status: Never Smoker  . Smokeless tobacco: Never Used  Substance Use Topics  . Alcohol  use: No  . Drug use: No     Allergies   Banana and Pork-derived products   Review of Systems Review of Systems See HPI  Physical Exam Triage Vital Signs ED Triage Vitals  Enc Vitals Group     BP 06/19/20 0843 (!) 133/93     Pulse Rate 06/19/20 0843 66     Resp 06/19/20 0843 16     Temp 06/19/20 0843 98.1 F (36.7 C)     Temp Source 06/19/20 0843 Oral     SpO2 06/19/20 0843 96 %     Weight --      Height --      Head Circumference --      Peak Flow --      Pain Score 06/19/20 0844 6     Pain Loc --      Pain Edu? --      Excl. in [place]? --    No data found.  Updated Vital Signs BP (!) 133/93 (BP Location: Right Arm)   Pulse 66   Temp 98.1 F (36.7 C) (Oral)   Resp 16   SpO2 96%      Physical Exam Constitutional:      General: He is not in acute  distress.    Appearance: He is well-developed, normal weight and well-nourished.     Comments: Mask is in place.  Is well-hydrated  HENT:     Head: Normocephalic and atraumatic.     Mouth/Throat:     Mouth: Oropharynx is clear and moist.  Eyes:     Conjunctiva/sclera: Conjunctivae normal.     Pupils: Pupils are equal, round, and reactive to light.  Cardiovascular:     Rate and Rhythm: Normal rate.  Pulmonary:     Effort: Pulmonary effort is normal. No respiratory distress.  Abdominal:     General: Bowel sounds are normal. There is no distension.     Palpations: Abdomen is soft. There is no hepatomegaly or splenomegaly.     Tenderness: There is no abdominal tenderness.     Hernia: No hernia is present.  Musculoskeletal:        General: No edema. Normal range of motion.     Cervical back: Normal range of motion.  Skin:    General: Skin is warm and dry.  Neurological:     Mental Status: He is alert.  Psychiatric:        Behavior: Behavior normal.      UC Treatments / Results  Labs (all labs ordered are listed, but only abnormal results are displayed) Labs Reviewed  SARS CORONAVIRUS 2 (TAT 6-24 HRS)     EKG   Radiology No results found.  Procedures Procedures (including critical care time)  Medications Ordered in UC Medications - No data to display  Initial Impression / Assessment and Plan / UC Course  I have reviewed the triage vital signs and the nursing notes.  Pertinent labs & imaging results that were available during my care of the patient were reviewed by me and considered in my medical decision making (see chart for details).     Prevention of dehydration is discussed with patient. Final Clinical Impressions(s) / UC Diagnoses   Final diagnoses:  Diarrhea of presumed infectious origin     Discharge Instructions     May take over the counter pepto bismol or imodium if needed Drink plenty of water Check My Chart for the test result You will be called if your COVID test is positive   ED Prescriptions    None     PDMP not reviewed this encounter.   Raylene Everts, MD 06/19/20 [phone]

## 2020-09-03 ENCOUNTER — Encounter (HOSPITAL_COMMUNITY): Payer: Self-pay | Admitting: Emergency Medicine

## 2020-09-03 ENCOUNTER — Other Ambulatory Visit: Payer: Self-pay

## 2020-09-03 ENCOUNTER — Ambulatory Visit (HOSPITAL_COMMUNITY)
Admission: EM | Admit: 2020-09-03 | Discharge: 2020-09-03 | Disposition: A | Discharge status: Home / Self Care | Payer: Medicaid Other | Attending: Emergency Medicine | Admitting: Emergency Medicine

## 2020-09-03 DIAGNOSIS — R519 Headache, unspecified: Secondary | ICD-10-CM

## 2020-09-03 DIAGNOSIS — R030 Elevated blood-pressure reading, without diagnosis of hypertension: Secondary | ICD-10-CM | POA: Diagnosis not present

## 2020-09-03 MED ORDER — KETOROLAC TROMETHAMINE 30 MG/ML IJ SOLN
30.0000 mg | Freq: Once | INTRAMUSCULAR | Status: AC
  Administered 2020-09-03: 30 mg via INTRAMUSCULAR

## 2020-09-03 MED ORDER — DEXAMETHASONE SODIUM PHOSPHATE 10 MG/ML IJ SOLN
INTRAMUSCULAR | Status: AC
  Filled 2020-09-03: qty 1

## 2020-09-03 MED ORDER — KETOROLAC TROMETHAMINE 30 MG/ML IJ SOLN
INTRAMUSCULAR | Status: AC
  Filled 2020-09-03: qty 1

## 2020-09-03 MED ORDER — DEXAMETHASONE SODIUM PHOSPHATE 10 MG/ML IJ SOLN
10.0000 mg | Freq: Once | INTRAMUSCULAR | Status: AC
  Administered 2020-09-03: 10 mg via INTRAMUSCULAR

## 2020-09-03 NOTE — ED Triage Notes (Signed)
Pt presents today with headache. He reports waking up with headache 7/10 and took BP at home 135/86. Denies n/v/d. Took two Advil 200 mg at 4:30am, no relief.

## 2020-09-03 NOTE — ED Provider Notes (Signed)
[place]    CSN: [id number] Arrival date & time: 09/03/20  4315      History   Chief Complaint Chief Complaint  Patient presents with  . Headache    HPI Scott Glenn is a 50 y.o. male.   Patient presents with headache since waking up this morning.  No falls or injury.  He reports associated photophobia.  He denies dizziness, weakness, numbness, chest pain, shortness of breath, nausea, vomiting, fever, or other symptoms.  Treatment attempted with Advil taken at 0430.  He states he took his blood pressure at home and it was 148/97.  His medical history includes asthma, migraines, seasonal allergies.  The history is provided by the patient and medical records.    Past Medical History:  Diagnosis Date  . Asthma   . Chicken pox   . Migraines   . Seasonal allergies     Patient Active Problem List   Diagnosis Date Noted  . ALLERGIC RHINITIS WITH CONJUNCTIVITIS 05/02/2008  . ASTHMA 05/02/2008  . GASTRITIS 03/03/2007    History reviewed. No pertinent surgical history.     Home Medications    Prior to Admission medications   Medication Sig Start Date End Date Taking? Authorizing Provider  diclofenac (VOLTAREN) 75 MG EC tablet Take 1 tablet (75 mg total) by mouth 2 (two) times daily. 04/04/20   Fransico Meadow, PA-C  methocarbamol (ROBAXIN) 500 MG tablet Take 1 tablet (500 mg total) by mouth 4 (four) times daily. 04/04/20   Fransico Meadow, PA-C  Multiple Vitamin (MULTIVITAMIN ADULT PO) Take 1 tablet by mouth daily.    [provider]  ondansetron (ZOFRAN ODT) 8 MG disintegrating tablet Take 1 tablet (8 mg total) by mouth every 8 (eight) hours as needed for nausea or vomiting. 11/14/19   Domenic Moras, PA-C    Family History Family History  Problem Relation Age of Onset  . Hypertension Mother   . Diabetes Mother   . Stroke Father   . Colon cancer Maternal Aunt     Social History Social History   Tobacco Use  . Smoking status: Never Smoker   . Smokeless tobacco: Never Used  Substance Use Topics  . Alcohol use: No  . Drug use: No     Allergies   Banana and Pork-derived products   Review of Systems Review of Systems  Constitutional: Negative for chills and fever.  HENT: Negative for ear pain and sore throat.   Eyes: Negative for pain and visual disturbance.  Respiratory: Negative for cough and shortness of breath.   Cardiovascular: Negative for chest pain and palpitations.  Gastrointestinal: Negative for abdominal pain, nausea and vomiting.  Genitourinary: Negative for dysuria and hematuria.  Musculoskeletal: Negative for arthralgias and back pain.  Skin: Negative for color change and rash.  Neurological: Positive for headaches. Negative for dizziness, seizures, syncope, weakness and numbness.  All other systems reviewed and are negative.    Physical Exam Triage Vital Signs ED Triage Vitals  Enc Vitals Group     BP      Pulse      Resp      Temp      Temp src      SpO2      Weight      Height      Head Circumference      Peak Flow      Pain Score      Pain Loc      Pain Edu?  Excl. in [place]?    No data found.  Updated Vital Signs BP (!) 139/93 (BP Location: Left Arm)   Pulse (!) 59   Temp 98.3 F (36.8 C) (Oral)   Resp 16   SpO2 97%   Visual Acuity Right Eye Distance:   Left Eye Distance:   Bilateral Distance:    Right Eye Near:   Left Eye Near:    Bilateral Near:     Physical Exam Vitals and nursing note reviewed.  Constitutional:      General: He is not in acute distress.    Appearance: He is well-developed and well-nourished. He is not ill-appearing.  HENT:     Head: Normocephalic and atraumatic.     Mouth/Throat:     Mouth: Mucous membranes are moist.  Eyes:     Conjunctiva/sclera: Conjunctivae normal.  Cardiovascular:     Rate and Rhythm: Normal rate and regular rhythm.     Heart sounds: Normal heart sounds.  Pulmonary:     Effort: Pulmonary effort is normal. No  respiratory distress.     Breath sounds: Normal breath sounds.  Abdominal:     Palpations: Abdomen is soft.     Tenderness: There is no abdominal tenderness.  Musculoskeletal:        General: No edema. Normal range of motion.     Cervical back: Neck supple.  Skin:    General: Skin is warm and dry.  Neurological:     General: No focal deficit present.     Mental Status: He is alert and oriented to person, place, and time.     Cranial Nerves: No cranial nerve deficit.     Sensory: No sensory deficit.     Motor: No weakness.     Coordination: Romberg sign negative.     Gait: Gait normal.  Psychiatric:        Mood and Affect: Mood and affect and mood normal.        Behavior: Behavior normal.      UC Treatments / Results  Labs (all labs ordered are listed, but only abnormal results are displayed) Labs Reviewed - No data to display  EKG   Radiology No results found.  Procedures Procedures (including critical care time)  Medications Ordered in UC Medications  ketorolac (TORADOL) 30 MG/ML injection 30 mg (has no administration in time range)  dexamethasone (DECADRON) injection 10 mg (has no administration in time range)    Initial Impression / Assessment and Plan / UC Course  I have reviewed the triage vital signs and the nursing notes.  Pertinent labs & imaging results that were available during my care of the patient were reviewed by me and considered in my medical decision making (see chart for details).   Acute non-intractable headache.  Elevated blood pressure reading.  Treating with dexamethasone and ketorolac.  Instructed patient to keep a log of his blood pressures at home.  Discussed that he should establish a primary care provider soon as possible; [facility] assistance requested.  Education provided on headaches and on preventing hypertension.  Patient agrees to plan of care.   Final Clinical Impressions(s) / UC Diagnoses   Final diagnoses:  Acute  nonintractable headache, unspecified headache type  Elevated blood pressure reading     Discharge Instructions     You were given injections of dexamethasone and ketorolac for your headache.  Your blood pressure is elevated today at 139/93.    Establish a primary care provider soon as possible.  Assistance from [facility] has been requested.          ED Prescriptions    None     PDMP not reviewed this encounter.   Sharion Balloon, NP 09/03/20 [phone]

## 2020-09-03 NOTE — Discharge Instructions (Addendum)
You were given injections of dexamethasone and ketorolac for your headache.  Your blood pressure is elevated today at 139/93.    Establish a primary care provider soon as possible.  Assistance from [facility] has been requested.

## 2020-09-08 ENCOUNTER — Emergency Department (HOSPITAL_COMMUNITY)
Admission: EM | Admit: 2020-09-08 | Discharge: 2020-09-08 | Disposition: A | Discharge status: Home / Self Care | Payer: Medicaid Other | Attending: Emergency Medicine | Admitting: Emergency Medicine

## 2020-09-08 ENCOUNTER — Emergency Department (HOSPITAL_COMMUNITY): Payer: Medicaid Other

## 2020-09-08 ENCOUNTER — Other Ambulatory Visit: Payer: Self-pay

## 2020-09-08 DIAGNOSIS — Z79899 Other long term (current) drug therapy: Secondary | ICD-10-CM | POA: Diagnosis not present

## 2020-09-08 DIAGNOSIS — G43019 Migraine without aura, intractable, without status migrainosus: Secondary | ICD-10-CM | POA: Diagnosis not present

## 2020-09-08 DIAGNOSIS — J45909 Unspecified asthma, uncomplicated: Secondary | ICD-10-CM | POA: Insufficient documentation

## 2020-09-08 DIAGNOSIS — R42 Dizziness and giddiness: Secondary | ICD-10-CM | POA: Diagnosis present

## 2020-09-08 LAB — URINALYSIS, ROUTINE W REFLEX MICROSCOPIC
Bilirubin Urine: NEGATIVE
Glucose, UA: NEGATIVE mg/dL
Hgb urine dipstick: NEGATIVE
Ketones, ur: NEGATIVE mg/dL
Leukocytes,Ua: NEGATIVE
Nitrite: NEGATIVE
Protein, ur: NEGATIVE mg/dL
Specific Gravity, Urine: 1.018 (ref 1.005–1.030)
pH: 7 (ref 5.0–8.0)

## 2020-09-08 LAB — BASIC METABOLIC PANEL
Anion gap: 8 (ref 5–15)
BUN: 12 mg/dL (ref 6–20)
CO2: 22 mmol/L (ref 22–32)
Calcium: 8.8 mg/dL — ABNORMAL LOW (ref 8.9–10.3)
Chloride: 108 mmol/L (ref 98–111)
Creatinine, Ser: 0.93 mg/dL (ref 0.61–1.24)
GFR, Estimated: >60 mL/min (ref >60)
Glucose, Bld: 99 mg/dL (ref 70–99)
Potassium: 4.2 mmol/L (ref 3.5–5.1)
Sodium: 138 mmol/L (ref 135–145)

## 2020-09-08 LAB — CBC
HCT: 38.6 % — ABNORMAL LOW (ref 39.0–52.0)
Hemoglobin: 13.3 g/dL (ref 13.0–17.0)
MCH: 32.2 pg (ref 26.0–34.0)
MCHC: 34.5 g/dL (ref 30.0–36.0)
MCV: 93.5 fL (ref 80.0–100.0)
Platelets: 232 10*3/uL (ref 150–400)
RBC: 4.13 MIL/uL — ABNORMAL LOW (ref 4.22–5.81)
RDW: 12.2 % (ref 11.5–15.5)
WBC: 4 10*3/uL (ref 4.0–10.5)
nRBC: 0 % (ref 0.0–0.2)

## 2020-09-08 LAB — CBG MONITORING, ED: Glucose-Capillary: 97 mg/dL (ref 70–99)

## 2020-09-08 MED ORDER — KETOROLAC TROMETHAMINE 30 MG/ML IJ SOLN
30.0000 mg | Freq: Once | INTRAMUSCULAR | Status: DC
  Filled 2020-09-08: qty 1

## 2020-09-08 MED ORDER — SODIUM CHLORIDE 0.9 % IV BOLUS
1000.0000 mL | Freq: Once | INTRAVENOUS | Status: AC
  Administered 2020-09-08: 1000 mL via INTRAVENOUS

## 2020-09-08 MED ORDER — DIPHENHYDRAMINE HCL 50 MG/ML IJ SOLN
12.5000 mg | Freq: Once | INTRAMUSCULAR | Status: AC
  Administered 2020-09-08: 12.5 mg via INTRAVENOUS
  Filled 2020-09-08: qty 1

## 2020-09-08 MED ORDER — METOCLOPRAMIDE HCL 5 MG/ML IJ SOLN
10.0000 mg | Freq: Once | INTRAMUSCULAR | Status: AC
  Administered 2020-09-08: 10 mg via INTRAVENOUS
  Filled 2020-09-08: qty 2

## 2020-09-08 NOTE — ED Notes (Signed)
To CT

## 2020-09-08 NOTE — Discharge Instructions (Signed)
You are seen today for headache, as we discussed you most likely have migraine syndrome.  I want you to take naproxen as directed on the bottle for pain when you have these headaches.  If you have any new worsening concerning symptoms such as worsening headache, vomiting, vision changes, neck pain, fevers, numbness or tingling or passing out need to come back to the emergency department.  Please use the attached instructions.  As we discussed I want you to follow-up with your primary care, your blood pressure was elevated today and need to get this under control.  Start by using the following instructions and keeping a blood pressure diary. Make sure to stay hydrated. Please follow up with a PCP ASAP.   Get help right away if: Your migraine headache gets very bad. Your migraine headache lasts longer than 72 hours. You have a fever. You have a stiff neck. You have trouble seeing. Your muscles feel weak or like you cannot control them. You start to lose your balance a lot. You start to have trouble walking. You pass out (faint). You have a seizure.

## 2020-09-08 NOTE — ED Provider Notes (Signed)
[facility] EMERGENCY DEPARTMENT Provider Note   CSN: [id number] Arrival date & time: 09/08/20  6440     History Chief Complaint  Patient presents with  . Dizziness  . Headache    Scott Glenn is a 50 y.o. male with pertient medical history of asthma, migraines who presents the emerge department today for dizziness with headache.  Patient states that he has had headache with gradual onset for the past week, has been persistent until today.  Patient states that he went to urgent care, was treated with migraine cocktail and pain resolved, however came back the next day.  Patient states that pain is worse in the morning.  Patient states that the pain is a dull, worse on the left side, states that it is primarily behind his eye.  Patient states that he primarily came in today because of the dizziness, feels like a lightheaded sensation.  Patient states that he feels like he is drunk, no substance abuse or alcohol use besides marijuana.  Patient states that urgent care told him that it was due to his blood pressure, does not take any blood pressure medication, blood pressure today 142/98.  States that he was very dizzy today that he almost passed out, states that he did fall, did not hit his head.  States that he never had anything like this before.  No sudden onset.  No numbness or tingling or weakness.  Does admit to some photophobia.  No eye pain or blurry vision.  No neck pain.  No trauma to his head.  Does not take anything for blood pressure.  Has not taken any medications for this at home.  HPI     Past Medical History:  Diagnosis Date  . Asthma   . Chicken pox   . Migraines   . Seasonal allergies     Patient Active Problem List   Diagnosis Date Noted  . ALLERGIC RHINITIS WITH CONJUNCTIVITIS 05/02/2008  . ASTHMA 05/02/2008  . GASTRITIS 03/03/2007    No past surgical history on file.     Family History  Problem Relation Age of Onset  . Hypertension  Mother   . Diabetes Mother   . Stroke Father   . Colon cancer Maternal Aunt     Social History   Tobacco Use  . Smoking status: Never Smoker  . Smokeless tobacco: Never Used  Substance Use Topics  . Alcohol use: No  . Drug use: No    Home Medications Prior to Admission medications   Medication Sig Start Date End Date Taking? Authorizing Provider  diclofenac (VOLTAREN) 75 MG EC tablet Take 1 tablet (75 mg total) by mouth 2 (two) times daily. 04/04/20   Fransico Meadow, PA-C  methocarbamol (ROBAXIN) 500 MG tablet Take 1 tablet (500 mg total) by mouth 4 (four) times daily. 04/04/20   Fransico Meadow, PA-C  Multiple Vitamin (MULTIVITAMIN ADULT PO) Take 1 tablet by mouth daily.    [provider]  ondansetron (ZOFRAN ODT) 8 MG disintegrating tablet Take 1 tablet (8 mg total) by mouth every 8 (eight) hours as needed for nausea or vomiting. 11/14/19   Domenic Moras, PA-C    Allergies    Banana and Pork-derived products  Review of Systems   Review of Systems  Constitutional: Negative for chills, diaphoresis, fatigue and fever.  HENT: Negative for congestion, sore throat and trouble swallowing.   Eyes: Negative for pain and visual disturbance.  Respiratory: Negative for cough, shortness of breath  and wheezing.   Cardiovascular: Negative for chest pain, palpitations and leg swelling.  Gastrointestinal: Negative for abdominal distention, abdominal pain, diarrhea, nausea and vomiting.  Genitourinary: Negative for difficulty urinating.  Musculoskeletal: Negative for back pain, neck pain and neck stiffness.  Skin: Negative for pallor.  Neurological: Positive for dizziness and headaches. Negative for syncope, facial asymmetry, speech difficulty, weakness and numbness.  Psychiatric/Behavioral: Negative for confusion.    Physical Exam Updated Vital Signs BP (!) 134/98   Pulse (!) 57   Temp 98 F (36.7 C) (Oral)   Resp 16   SpO2 99%   Physical Exam Constitutional:       General: He is not in acute distress.    Appearance: Normal appearance. He is not ill-appearing, toxic-appearing or diaphoretic.  HENT:     Mouth/Throat:     Mouth: Mucous membranes are moist.     Pharynx: Oropharynx is clear.  Eyes:     General: No scleral icterus.    Extraocular Movements: Extraocular movements intact.     Pupils: Pupils are equal, round, and reactive to light.     Funduscopic exam:    Right eye: No papilledema.        Left eye: No papilledema.  Cardiovascular:     Rate and Rhythm: Normal rate and regular rhythm.     Pulses: Normal pulses.     Heart sounds: Normal heart sounds.  Pulmonary:     Effort: Pulmonary effort is normal. No respiratory distress.     Breath sounds: Normal breath sounds. No stridor. No wheezing, rhonchi or rales.  Chest:     Chest wall: No tenderness.  Abdominal:     General: Abdomen is flat. There is no distension.     Palpations: Abdomen is soft.     Tenderness: There is no abdominal tenderness. There is no guarding or rebound.  Musculoskeletal:        General: No swelling or tenderness. Normal range of motion.     Cervical back: Normal range of motion and neck supple. No rigidity.     Right lower leg: No edema.     Left lower leg: No edema.  Skin:    General: Skin is warm and dry.     Capillary Refill: Capillary refill takes less than 2 seconds.     Coloration: Skin is not pale.  Neurological:     General: No focal deficit present.     Mental Status: He is alert and oriented to person, place, and time.     Comments: Alert. Clear speech. No facial droop. CNIII-XII grossly intact. Bilateral upper and lower extremities' sensation grossly intact. 5/5 symmetric strength with grip strength and with plantar and dorsi flexion bilaterally.  . Normal finger to nose bilaterally. Negative pronator drift. Negative Romberg sign. Gait is steady and intact.    Psychiatric:        Mood and Affect: Mood normal.        Behavior: Behavior normal.      ED Results / Procedures / Treatments   Labs (all labs ordered are listed, but only abnormal results are displayed) Labs Reviewed  BASIC METABOLIC PANEL - Abnormal; Notable for the following components:      Result Value   Calcium 8.8 (*)    All other components within normal limits  CBC - Abnormal; Notable for the following components:   RBC 4.13 (*)    HCT 38.6 (*)    All other components within normal limits  URINALYSIS, ROUTINE  W REFLEX MICROSCOPIC  CBG MONITORING, ED    EKG EKG Interpretation  Date/Time:  Monday September 08 2020 09:39:49 EST Ventricular Rate:  58 PR Interval:  178 QRS Duration: 90 QT Interval:  410 QTC Calculation: 402 R Axis:   42 Text Interpretation: Sinus bradycardia with sinus arrhythmia Otherwise normal ECG Confirmed by Lacretia Leigh ([id number]) on 09/08/2020 10:12:07 AM   Radiology CT Head Wo Contrast  Result Date: 09/08/2020 CLINICAL DATA:  Dizziness and headache. EXAM: CT HEAD WITHOUT CONTRAST TECHNIQUE: Contiguous axial images were obtained from the base of the skull through the vertex without intravenous contrast. COMPARISON:  04/15/2010 FINDINGS: Brain: There is no evidence of an acute infarct, intracranial hemorrhage, mass, midline shift, or extra-axial fluid collection. The ventricles and sulci are normal. Scattered small hypodensities in the cerebral white matter bilaterally are new from the prior study. Vascular: No hyperdense vessel. Skull: No fracture suspicious osseous lesion. Sinuses/Orbits: Visualized paranasal sinuses and mastoid air cells are clear. Unremarkable orbits. Other: None. IMPRESSION: 1. No evidence of an acute infarct or intracranial hemorrhage. 2. Scattered small hypodensities in the cerebral white matter bilaterally, new from 2011. These are nonspecific but may reflect early chronic small vessel ischemia. Electronically Signed   By: Logan Bores M.D.   On: 09/08/2020 13:16    Procedures Procedures   Medications Ordered in  ED Medications  ketorolac (TORADOL) 30 MG/ML injection 30 mg (has no administration in time range)  sodium chloride 0.9 % bolus 1,000 mL (1,000 mLs Intravenous New Bag/Given 09/08/20 1054)  metoCLOPramide (REGLAN) injection 10 mg (10 mg Intravenous Given 09/08/20 1055)  diphenhydrAMINE (BENADRYL) injection 12.5 mg (12.5 mg Intravenous Given 09/08/20 1058)    ED Course  I have reviewed the triage vital signs and the nursing notes.  Pertinent labs & imaging results that were available during my care of the patient were reviewed by me and considered in my medical decision making (see chart for details).    MDM Rules/Calculators/A&P                         TATE ZAGAL is a 50 y.o. male with remote medical history of asthma, migraines who presents the emerge department today for dizziness with headache.  Normal neuro exam, blood pressure today 139/90, other vital signs normal.  Patient states that he has not been taking anything for this, will obtain basic labs and CT imaging since this is worse in the morning.  No papilledema on exam. Appears as if this is a migraine.   Work-up today benign, CT imaging without any acute cranial pathology.  Upon reevaluation after fluids, Benadryl and Reglan patient states that he feels much better.  Originally patient felt slightly dizzy with orthostatics, however on repeat evaluation patient does not feel dizzy anymore.  Suspect migraine with low p.o. intake, patient states that he is ready to go home and eat, after feeling much better.  Patient will follow with PCP.  Medication management discussed.  Repeat normal neuro.   Doubt need for further emergent work up at this time. I explained the diagnosis and have given explicit precautions to return to the ER including for any other new or worsening symptoms. The patient understands and accepts the medical plan as it's been dictated and I have answered their questions. Discharge instructions concerning home care and  prescriptions have been given. The patient is STABLE and is discharged to home in good condition.   Final Clinical Impression(s) / ED  Diagnoses Final diagnoses:  Intractable migraine without aura and without status migrainosus    Rx / DC Orders ED Discharge Orders    None       Alfredia Client, PA-C 09/08/20 1530    Lacretia Leigh, MD 09/10/20 1023

## 2020-09-08 NOTE — ED Triage Notes (Signed)
Pt woke up this morning with dizziness and headache. Seen for same on 3/2 and was told it was his high blood pressure. No neuro deficits noted in triage.

## 2020-09-22 ENCOUNTER — Other Ambulatory Visit: Payer: Self-pay

## 2020-09-22 ENCOUNTER — Ambulatory Visit: Payer: Medicaid Other | Admitting: Physician Assistant

## 2020-09-22 VITALS — BP 123/81 | HR 78 | Temp 98.2°F | Resp 18 | Ht 69.0 in | Wt 175.0 lb

## 2020-09-22 DIAGNOSIS — F5104 Psychophysiologic insomnia: Secondary | ICD-10-CM

## 2020-09-22 DIAGNOSIS — Z13228 Encounter for screening for other metabolic disorders: Secondary | ICD-10-CM

## 2020-09-22 DIAGNOSIS — R030 Elevated blood-pressure reading, without diagnosis of hypertension: Secondary | ICD-10-CM | POA: Diagnosis not present

## 2020-09-22 DIAGNOSIS — F439 Reaction to severe stress, unspecified: Secondary | ICD-10-CM

## 2020-09-22 DIAGNOSIS — Z114 Encounter for screening for human immunodeficiency virus [HIV]: Secondary | ICD-10-CM

## 2020-09-22 DIAGNOSIS — G44209 Tension-type headache, unspecified, not intractable: Secondary | ICD-10-CM

## 2020-09-22 DIAGNOSIS — R5383 Other fatigue: Secondary | ICD-10-CM | POA: Diagnosis not present

## 2020-09-22 DIAGNOSIS — Z1159 Encounter for screening for other viral diseases: Secondary | ICD-10-CM

## 2020-09-22 MED ORDER — HYDROXYZINE HCL 10 MG PO TABS: ORAL_TABLET | ORAL | 0 refills | Status: DC

## 2020-09-22 NOTE — Progress Notes (Signed)
Patient has not taken medication today and patient has eaten today. Patient checks his BP at home.

## 2020-09-22 NOTE — Patient Instructions (Signed)
Please return to the mobile medicine unit in the morning for fasting labs.  For your difficulty staying asleep, I recommend that you take 10 to 30 mg of hydroxyzine before bed.  Make sure your sleeping environment is dark quiet and cold.  I recommend running a fan or using white noise instead of a television.  I encourage you to increase your water intake, make sure you are drinking approximately 80 ounces of water a day.  Continue checking your blood pressure, keep a written log and have available for all office visits.  We will call you with your lab results.   Kennieth Rad, PA-C Physician Assistant [place] [email or web address]    Managing Stress, Adult Feeling a certain amount of stress is normal. Stress helps our body and mind get ready to deal with the demands of life. Stress hormones can motivate you to do well at work and meet your responsibilities. However severe or long-lasting (chronic) stress can affect your mental and physical health. Chronic stress puts you at higher risk for anxiety, depression, and other health problems like digestive problems, muscle aches, heart disease, high blood pressure, and stroke. What are the causes? Common causes of stress include:  Demands from work, such as deadlines, feeling overworked, or having long hours.  Pressures at home, such as money issues, disagreements with a spouse, or parenting issues.  Pressures from major life changes, such as divorce, moving, loss of a loved one, or chronic illness. You may be at higher risk for stress-related problems if you do not get enough sleep, are in poor health, do not have emotional support, or have a mental health disorder like anxiety or depression. How to recognize stress Stress can make you:  Have trouble sleeping.  Feel sad, anxious, irritable, or overwhelmed.  Lose your appetite.  Overeat or want to eat unhealthy foods.  Want to  use drugs or alcohol. Stress can also cause physical symptoms, such as:  Sore, tense muscles, especially in the shoulders and neck.  Headaches.  Trouble breathing.  A faster heart rate.  Stomach pain, nausea, or vomiting.  Diarrhea or constipation.  Trouble concentrating. Follow these instructions at home: Lifestyle  Identify the source of your stress and your reaction to it. See a therapist who can help you change your reactions.  When there are stressful events: ? Talk about it with family, friends, or co-workers. ? Try to think realistically about stressful events and not ignore them or overreact. ? Try to find the positives in a stressful situation and not focus on the negatives. ? Cut back on responsibilities at work and home, if possible. Ask for help from friends or family members if you need it.  Find ways to cope with stress, such as: ? Meditation. ? Deep breathing. ? Yoga or tai chi. ? Progressive muscle relaxation. ? Doing art, playing music, or reading. ? Making time for fun activities. ? Spending time with family and friends.  Get support from family, friends, or spiritual resources. Eating and drinking  Eat a healthy diet. This includes: ? Eating foods that are high in fiber, such as beans, whole grains, and fresh fruits and vegetables. ? Limiting foods that are high in fat and processed sugars, such as fried and sweet foods.  Do not skip meals or overeat.  Drink enough fluid to keep your urine pale yellow. Alcohol use  Do not drink alcohol if: ? Your health care provider tells you not to drink. ? You are  pregnant, may be pregnant, or are planning to become pregnant.  Drinking alcohol is a way some people try to ease their stress. This can be dangerous, so if you drink alcohol: ? Limit how much you use to:  0-1 drink a day for women.  0-2 drinks a day for men. ? Be aware of how much alcohol is in your drink. In the [place] one drink equals one 12  oz bottle of beer (355 mL), one 5 oz glass of wine (148 mL), or one 1 oz glass of hard liquor (44 mL). Activity  Include 30 minutes of exercise in your daily schedule. Exercise is a good stress reducer.  Include time in your day for an activity that you find relaxing. Try taking a walk, going on a bike ride, reading a book, or listening to music.  Schedule your time in a way that lowers stress, and keep a consistent schedule. Prioritize what is most important to get done.   General instructions  Get enough sleep. Try to go to sleep and get up at about the same time every day.  Take over-the-counter and prescription medicines only as told by your health care provider.  Do not use any products that contain nicotine or tobacco, such as cigarettes, e-cigarettes, and chewing tobacco. If you need help quitting, ask your health care provider.  Do not use drugs or smoke to cope with stress.  Keep all follow-up visits as told by your health care provider. This is important. Where to find support  Talk with your health care provider about stress management or finding a support group.  Find a therapist to work with you on your stress management techniques. Contact a health care provider if:  Your stress symptoms get worse.  You are unable to manage your stress at home.  You are struggling to stop using drugs or alcohol. Get help right away if:  You may be a danger to yourself or others.  You have any thoughts of death or suicide. If you ever feel like you may hurt yourself or others, or have thoughts about taking your own life, get help right away. You can go to your nearest emergency department or call:  Your local emergency services (911 in the [place]  A suicide crisis helpline, such as the [place] at (570)222-8972. This is open 24 hours a day. Summary  Feeling a certain amount of stress is normal, but severe or long-lasting (chronic) stress can affect  your mental and physical health.  Chronic stress can put you at higher risk for anxiety, depression, and other health problems like digestive problems, muscle aches, heart disease, high blood pressure, and stroke.  You may be at higher risk for stress-related problems if you do not get enough sleep, are in poor health, lack emotional support, or have a mental health disorder like anxiety or depression.  Identify the source of your stress and your reaction to it. Try talking about stressful events with family, friends, or co-workers, finding a coping method, or getting support from spiritual resources.  If you need more help, talk with your health care provider about finding a support group or a mental health therapist. This information is not intended to replace advice given to you by your health care provider. Make sure you discuss any questions you have with your health care provider. Document Revised: 01/17/2019 Document Reviewed: 01/17/2019 Elsevier Patient Education  2021 [place].    Insomnia Insomnia is a sleep disorder that  makes it difficult to fall asleep or stay asleep. Insomnia can cause fatigue, low energy, difficulty concentrating, mood swings, and poor performance at work or school. There are three different ways to classify insomnia:  Difficulty falling asleep.  Difficulty staying asleep.  Waking up too early in the morning. Any type of insomnia can be long-term (chronic) or short-term (acute). Both are common. Short-term insomnia usually lasts for three months or less. Chronic insomnia occurs at least three times a week for longer than three months. What are the causes? Insomnia may be caused by another condition, situation, or substance, such as:  Anxiety.  Certain medicines.  Gastroesophageal reflux disease (GERD) or other gastrointestinal conditions.  Asthma or other breathing conditions.  Restless legs syndrome, sleep apnea, or other sleep  disorders.  Chronic pain.  Menopause.  Stroke.  Abuse of alcohol, tobacco, or illegal drugs.  Mental health conditions, such as depression.  Caffeine.  Neurological disorders, such as Alzheimer's disease.  An overactive thyroid (hyperthyroidism). Sometimes, the cause of insomnia may not be known. What increases the risk? Risk factors for insomnia include:  Gender. Women are affected more often than men.  Age. Insomnia is more common as you get older.  Stress.  Lack of exercise.  Irregular work schedule or working night shifts.  Traveling between different time zones.  Certain medical and mental health conditions. What are the signs or symptoms? If you have insomnia, the main symptom is having trouble falling asleep or having trouble staying asleep. This may lead to other symptoms, such as:  Feeling fatigued or having low energy.  Feeling nervous about going to sleep.  Not feeling rested in the morning.  Having trouble concentrating.  Feeling irritable, anxious, or depressed. How is this diagnosed? This condition may be diagnosed based on:  Your symptoms and medical history. Your health care provider may ask about: ? Your sleep habits. ? Any medical conditions you have. ? Your mental health.  A physical exam. How is this treated? Treatment for insomnia depends on the cause. Treatment may focus on treating an underlying condition that is causing insomnia. Treatment may also include:  Medicines to help you sleep.  Counseling or therapy.  Lifestyle adjustments to help you sleep better. Follow these instructions at home: Eating and drinking  Limit or avoid alcohol, caffeinated beverages, and cigarettes, especially close to bedtime. These can disrupt your sleep.  Do not eat a large meal or eat spicy foods right before bedtime. This can lead to digestive discomfort that can make it hard for you to sleep.   Sleep habits  Keep a sleep diary to help you  and your health care provider figure out what could be causing your insomnia. Write down: ? When you sleep. ? When you wake up during the night. ? How well you sleep. ? How rested you feel the next day. ? Any side effects of medicines you are taking. ? What you eat and drink.  Make your bedroom a dark, comfortable place where it is easy to fall asleep. ? Put up shades or blackout curtains to block light from outside. ? Use a white noise machine to block noise. ? Keep the temperature cool.  Limit screen use before bedtime. This includes: ? Watching TV. ? Using your smartphone, tablet, or computer.  Stick to a routine that includes going to bed and waking up at the same times every day and night. This can help you fall asleep faster. Consider making a quiet activity, such as  reading, part of your nighttime routine.  Try to avoid taking naps during the day so that you sleep better at night.  Get out of bed if you are still awake after 15 minutes of trying to sleep. Keep the lights down, but try reading or doing a quiet activity. When you feel sleepy, go back to bed.   General instructions  Take over-the-counter and prescription medicines only as told by your health care provider.  Exercise regularly, as told by your health care provider. Avoid exercise starting several hours before bedtime.  Use relaxation techniques to manage stress. Ask your health care provider to suggest some techniques that may work well for you. These may include: ? Breathing exercises. ? Routines to release muscle tension. ? Visualizing peaceful scenes.  Make sure that you drive carefully. Avoid driving if you feel very sleepy.  Keep all follow-up visits as told by your health care provider. This is important. Contact a health care provider if:  You are tired throughout the day.  You have trouble in your daily routine due to sleepiness.  You continue to have sleep problems, or your sleep problems get  worse. Get help right away if:  You have serious thoughts about hurting yourself or someone else. If you ever feel like you may hurt yourself or others, or have thoughts about taking your own life, get help right away. You can go to your nearest emergency department or call:  Your local emergency services (911 in the [place]  A suicide crisis helpline, such as the [place] at 831-802-0036. This is open 24 hours a day. Summary  Insomnia is a sleep disorder that makes it difficult to fall asleep or stay asleep.  Insomnia can be long-term (chronic) or short-term (acute).  Treatment for insomnia depends on the cause. Treatment may focus on treating an underlying condition that is causing insomnia.  Keep a sleep diary to help you and your health care provider figure out what could be causing your insomnia. This information is not intended to replace advice given to you by your health care provider. Make sure you discuss any questions you have with your health care provider. Document Revised: 05/01/2020 Document Reviewed: 05/01/2020 Elsevier Patient Education  2021 [employer].

## 2020-09-22 NOTE — Progress Notes (Signed)
New Patient Office Visit  Subjective:  Patient ID: Scott Glenn, male    DOB: [birth year]  Age: 50 y.o. MRN: [id number]  CC:  Chief Complaint  Patient presents with  . Blood Pressure Check    HPI Scott Glenn reports that he presented to urgent care on September 03, 2020 for a severe headache with photophobia.  He had checked his blood pressure and it was elevated at 148/97.  He was treated with a steroid and Toradol injection and was encouraged to keep a log of his blood pressures at home.  Reports he presented to the emergency department on September 08, 2020 after having continued headaches with this time with a feeling of lightheadedness.  States that he felt like he was going to pass out, no loss of.  Reports he continued to have elevated blood pressure readings at home.  ED note                         Scott Glenn is a 50 y.o. male with remote medical history of asthma, migraines who presents the emerge department today for dizziness with headache.  Normal neuro exam, blood pressure today 139/90, other vital signs normal.  Patient states that he has not been taking anything for this, will obtain basic labs and CT imaging since this is worse in the morning.  No papilledema on exam. Appears as if this is a migraine.   Work-up today benign, CT imaging without any acute cranial pathology.  Upon reevaluation after fluids, Benadryl and Reglan patient states that he feels much better.  Originally patient felt slightly dizzy with orthostatics, however on repeat evaluation patient does not feel dizzy anymore.  Suspect migraine with low p.o. intake, patient states that he is ready to go home and eat, after feeling much better.  Patient will follow with PCP.  Medication management discussed.  Repeat normal neuro.   Doubt need for further emergent work up at this time. I explained the diagnosis and have given explicit precautions to return to the ER including for any other new or worsening  symptoms. The patient understands and accepts the medical plan as it's been dictated and I have answered their questions. Discharge instructions concerning home care and prescriptions have been given. The patient is STABLE and is discharged to home in good condition.  Reports today that he continues to check his blood pressure several times a day, will have occasional elevated readings of 140/80.  Reports that he does watch his sodium intake, drinks approximately 2-3 bottles of water a day and then mostly soda.  Reports that he has increased stressors, states that he has difficulty sleeping, is able to fall asleep but has difficulty staying asleep.  Reports he is sleeping approximately 5 hours at night.  Endorses mind racing when he is unable to fall back asleep.  Reports that he does sleep with the TV on to try to help drown out any noises.  Reports he has previously tried melatonin without relief   Reports Significant Family History of father passing from a stroke at a young age, does not know his medical history.  Reports that he has a very active job, does a lot of lifting.  Reports that he will occasionally take testosterone over-the-counter supplement to help him with strength and fatigue.   Past Medical History:  Diagnosis Date  . Asthma   . Chicken pox   . Migraines   . Seasonal  allergies     No past surgical history on file.  Family History  Problem Relation Age of Onset  . Hypertension Mother   . Diabetes Mother   . Stroke Father   . Colon cancer Maternal Aunt     Social History   Socioeconomic History  . Marital status: Married    Spouse name: Not on file  . Number of children: Not on file  . Years of education: Not on file  . Highest education level: Not on file  Occupational History  . Not on file  Tobacco Use  . Smoking status: Never Smoker  . Smokeless tobacco: Never Used  Substance and Sexual Activity  . Alcohol use: No  . Drug use: No  . Sexual  activity: Not on file  Other Topics Concern  . Not on file  Social History Narrative  . Not on file   Social Determinants of Health   Financial Resource Strain: Not on file  Food Insecurity: Not on file  Transportation Needs: Not on file  Physical Activity: Not on file  Stress: Not on file  Social Connections: Not on file  Intimate Partner Violence: Not on file    ROS Review of Systems  Constitutional: Positive for fatigue. Negative for chills and fever.  HENT: Negative.   Eyes: Negative for photophobia.  Respiratory: Negative for shortness of breath.   Cardiovascular: Negative for chest pain and palpitations.  Gastrointestinal: Negative.   Endocrine: Negative.   Genitourinary: Negative.   Musculoskeletal: Negative.   Skin: Negative.   Allergic/Immunologic: Negative.   Neurological: Positive for dizziness and headaches.  Hematological: Negative.   Psychiatric/Behavioral: Positive for sleep disturbance. Negative for dysphoric mood, self-injury and suicidal ideas. The patient is nervous/anxious.     Objective:   Today's Vitals: BP 123/81 (BP Location: Left Arm, Patient Position: Sitting, Cuff Size: Normal)   Pulse 78   Temp 98.2 F (36.8 C) (Oral)   Resp 18   Ht 5\' 9"  (1.753 m)   Wt 175 lb (79.4 kg)   SpO2 96%   BMI 25.84 kg/m   Physical Exam Vitals and nursing note reviewed.  Constitutional:      General: He is not in acute distress.    Appearance: Normal appearance. He is not ill-appearing.  HENT:     Head: Normocephalic and atraumatic.     Right Ear: External ear normal.     Left Ear: External ear normal.     Nose: Nose normal.     Mouth/Throat:     Mouth: Mucous membranes are moist.     Pharynx: Oropharynx is clear.  Cardiovascular:     Rate and Rhythm: Normal rate and regular rhythm.     Pulses: Normal pulses.     Heart sounds: Normal heart sounds.  Pulmonary:     Effort: Pulmonary effort is normal.     Breath sounds: Normal breath sounds.   Musculoskeletal:        General: Normal range of motion.     Cervical back: Normal range of motion and neck supple.  Skin:    General: Skin is warm and dry.  Neurological:     General: No focal deficit present.     Mental Status: He is alert and oriented to person, place, and time.  Psychiatric:        Mood and Affect: Mood normal.        Behavior: Behavior normal.        Thought Content: Thought content normal.  Judgment: Judgment normal.     Assessment & Plan:   Problem List Items Addressed This Visit   None   Visit Diagnoses    Elevated blood pressure reading without diagnosis of hypertension    -  Primary   Relevant Orders   TSH   Screening for metabolic disorder       Relevant Orders   Comp. Metabolic Panel (12)   Lipid panel   Acute non intractable tension-type headache       Relevant Orders   TSH   Vitamin D, 25-hydroxy   Psychophysiological insomnia       Relevant Medications   hydrOXYzine (ATARAX/VISTARIL) 10 MG tablet   Stress       Screening for HIV without presence of risk factors       Relevant Orders   HIV antibody (with reflex)   Encounter for HCV screening test for low risk patient       Relevant Orders   HCV Ab w/Rflx to Verification   Fatigue, unspecified type       Relevant Orders   Testosterone      Outpatient Encounter Medications as of 09/22/2020  Medication Sig  . hydrOXYzine (ATARAX/VISTARIL) 10 MG tablet Take 1-3 tabs PO QHS PRN for insomnia  . diclofenac (VOLTAREN) 75 MG EC tablet Take 1 tablet (75 mg total) by mouth 2 (two) times daily.  . methocarbamol (ROBAXIN) 500 MG tablet Take 1 tablet (500 mg total) by mouth 4 (four) times daily.  . Multiple Vitamin (MULTIVITAMIN ADULT PO) Take 1 tablet by mouth daily.  . ondansetron (ZOFRAN ODT) 8 MG disintegrating tablet Take 1 tablet (8 mg total) by mouth every 8 (eight) hours as needed for nausea or vomiting.   No facility-administered encounter medications on file as of 09/22/2020.   1. Elevated blood pressure reading without diagnosis of hypertension Blood pressure reading today within normal limits.  Encourage patient to continue checking blood pressure at home daily low-sodium diet, increase water intake.    Patient to return to the mobile unit in the morning for fasting labs.  Patient given appointment to establish care with Dr. Joya Gaskins in May 2022.  Patient encouraged to return to the mobile unit if needed prior to that visit.   - TSH; Future  2. Stress Patient education given on ways to reduce.  3. Fatigue, unspecified type Patient education given on good sleep hygiene - Testosterone; Future  4. Psychophysiological insomnia Trial hydroxyzine - hydrOXYzine (ATARAX/VISTARIL) 10 MG tablet; Take 1-3 tabs PO QHS PRN for insomnia  Dispense: 30 tablet; Refill: 0  5. Acute non intractable tension-type headache Patient encouraged to increase hydration, work on managing stress,  improving sleep hygiene - TSH; Future - Vitamin D, 25-hydroxy; Future  6. Screening for metabolic disorder  - Comp. Metabolic Panel (12); Future - Lipid panel; Future  7. Screening for HIV without presence of risk factors  - HIV antibody (with reflex); Future  8. Encounter for HCV screening test for low risk patient  - HCV Ab w/Rflx to Verification; Future   I have reviewed the patient's medical history (PMH, PSH, Social History, Family History, Medications, and allergies) , and have been updated if relevant. I spent 30 minutes reviewing chart and  face to face time with patient.     Follow-up: Return in about 1 day (around 09/23/2020) for Fasting  labs.   Loraine Grip Mayers, PA-C

## 2020-09-23 ENCOUNTER — Other Ambulatory Visit: Payer: Medicaid Other

## 2020-09-23 DIAGNOSIS — Z13228 Encounter for screening for other metabolic disorders: Secondary | ICD-10-CM

## 2020-09-23 DIAGNOSIS — F5104 Psychophysiologic insomnia: Secondary | ICD-10-CM | POA: Insufficient documentation

## 2020-09-23 DIAGNOSIS — G44209 Tension-type headache, unspecified, not intractable: Secondary | ICD-10-CM | POA: Insufficient documentation

## 2020-09-23 DIAGNOSIS — Z114 Encounter for screening for human immunodeficiency virus [HIV]: Secondary | ICD-10-CM

## 2020-09-23 DIAGNOSIS — R5383 Other fatigue: Secondary | ICD-10-CM

## 2020-09-23 DIAGNOSIS — R030 Elevated blood-pressure reading, without diagnosis of hypertension: Secondary | ICD-10-CM

## 2020-09-23 DIAGNOSIS — F439 Reaction to severe stress, unspecified: Secondary | ICD-10-CM | POA: Insufficient documentation

## 2020-09-23 DIAGNOSIS — E559 Vitamin D deficiency, unspecified: Secondary | ICD-10-CM

## 2020-09-23 DIAGNOSIS — Z1159 Encounter for screening for other viral diseases: Secondary | ICD-10-CM

## 2020-09-25 DIAGNOSIS — E559 Vitamin D deficiency, unspecified: Secondary | ICD-10-CM | POA: Insufficient documentation

## 2020-09-25 LAB — TSH: TSH: 1.01 u[IU]/mL (ref 0.450–4.500)

## 2020-09-25 LAB — COMP. METABOLIC PANEL (12)
AST: 36 IU/L (ref 0–40)
Albumin/Globulin Ratio: 2.3 — ABNORMAL HIGH (ref 1.2–2.2)
Albumin: 4.6 g/dL (ref 4.0–5.0)
Alkaline Phosphatase: 72 IU/L (ref 44–121)
BUN/Creatinine Ratio: 8 — ABNORMAL LOW (ref 9–20)
BUN: 9 mg/dL (ref 6–24)
Bilirubin Total: 1.4 mg/dL — ABNORMAL HIGH (ref 0.0–1.2)
Calcium: 9.1 mg/dL (ref 8.7–10.2)
Chloride: 104 mmol/L (ref 96–106)
Creatinine, Ser: 1.07 mg/dL (ref 0.76–1.27)
Globulin, Total: 2 g/dL (ref 1.5–4.5)
Glucose: 125 mg/dL — ABNORMAL HIGH (ref 65–99)
Potassium: 4.3 mmol/L (ref 3.5–5.2)
Sodium: 141 mmol/L (ref 134–144)
Total Protein: 6.6 g/dL (ref 6.0–8.5)
eGFR: 86 mL/min/{1.73_m2} (ref >59)

## 2020-09-25 LAB — LIPID PANEL
Chol/HDL Ratio: 2.9 ratio (ref 0.0–5.0)
Cholesterol, Total: 166 mg/dL (ref 100–199)
HDL: 58 mg/dL (ref >39)
LDL Chol Calc (NIH): 83 mg/dL (ref 0–99)
Triglycerides: 143 mg/dL (ref 0–149)
VLDL Cholesterol Cal: 25 mg/dL (ref 5–40)

## 2020-09-25 LAB — HCV AB W/RFLX TO VERIFICATION: HCV Ab: <0.1 s/co ratio (ref 0.0–0.9)

## 2020-09-25 LAB — HIV ANTIBODY (ROUTINE TESTING W REFLEX): HIV Screen 4th Generation wRfx: NONREACTIVE

## 2020-09-25 LAB — TESTOSTERONE: Testosterone: 450 ng/dL (ref 264–916)

## 2020-09-25 LAB — VITAMIN D 25 HYDROXY (VIT D DEFICIENCY, FRACTURES): Vit D, 25-Hydroxy: 10.5 ng/mL — ABNORMAL LOW (ref 30.0–100.0)

## 2020-09-25 LAB — HCV INTERPRETATION

## 2020-09-25 MED ORDER — VITAMIN D (ERGOCALCIFEROL) 1.25 MG (50000 UNIT) PO CAPS: 50000.0000 [IU] | ORAL_CAPSULE | ORAL | 2 refills | Status: DC

## 2020-09-25 NOTE — Addendum Note (Signed)
Addended by: Kennieth Rad on: 09/25/2020 08:32 AM   Modules accepted: Orders

## 2020-09-29 ENCOUNTER — Telehealth: Payer: Self-pay | Admitting: *Deleted

## 2020-09-29 NOTE — Telephone Encounter (Signed)
-----   Message from Scott Glenn, [place] sent at 09/25/2020  8:32 AM EDT ----- Please call patient and let him know that his vitamin D level was very low, he needs to take 50,000 units once a week for at least the next 12 weeks.  Low vitamin D levels can contribute to headaches.  Prescription sent to his pharmacy.  Testosterone level, thyroid level, cholesterol level, kidney and liver function are within normal limits.  His screening for hepatitis C and HIV were negative. He does not show signs of anemia.  His fasting blood glucose was elevated, he should have his A1c checked at his next OV.  He can also return to the [facility] to have it checked.

## 2020-09-29 NOTE — Telephone Encounter (Signed)
Medical Assistant left message on patient's home and cell voicemail. Voicemail states to give a call back to [place] with [facility] at [phone]. Per signed DPR, patient is aware of labs being normal and screening being negative. Patient was advised of low vitamin d level and needing to take weekly supplements with a recheck being completed in 3 months. Patient is also aware of elevated glucose level and having an A1C completed at the next OV.

## 2020-11-10 ENCOUNTER — Other Ambulatory Visit: Payer: Self-pay

## 2020-11-10 ENCOUNTER — Ambulatory Visit (HOSPITAL_COMMUNITY)
Admission: EM | Admit: 2020-11-10 | Discharge: 2020-11-10 | Disposition: A | Discharge status: Home / Self Care | Payer: Medicaid Other | Attending: Emergency Medicine | Admitting: Emergency Medicine

## 2020-11-10 ENCOUNTER — Encounter (HOSPITAL_COMMUNITY): Payer: Self-pay

## 2020-11-10 DIAGNOSIS — M545 Low back pain, unspecified: Secondary | ICD-10-CM | POA: Diagnosis not present

## 2020-11-10 DIAGNOSIS — S39012A Strain of muscle, fascia and tendon of lower back, initial encounter: Secondary | ICD-10-CM | POA: Diagnosis not present

## 2020-11-10 MED ORDER — NAPROXEN 500 MG PO TABS: 500.0000 mg | ORAL_TABLET | Freq: Two times a day (BID) | ORAL | 0 refills | Status: DC

## 2020-11-10 NOTE — ED Triage Notes (Addendum)
Pt c/o pain in center of lower back since 2 days ago after twisting it the wrong way. Pt has been taking ibuprofen for symptoms without relief.

## 2020-11-10 NOTE — ED Provider Notes (Signed)
[place]    CSN: [id number] Arrival date & time: 11/10/20  1106      History   Chief Complaint Chief Complaint  Patient presents with  . Back Pain    HPI Scott Glenn is a 50 y.o. male.   Patient here for evaluation of lower mid back pain that has been ongoing for the past 2 days.  Reports pain started after "twisting it the wrong way."  Reports taking ibuprofen with minimal pain relief.  Does reports having physical job and has had low back pain intermittently for awhile, but states it has been worse for the past two days.  Denies any numbness or tingling in extremities.  No difficulty moving extremities.  Denies any loss of bowel or bladder control.  Pain worse with movement.  Denies any fevers, chest pain, shortness of breath, N/V/D, numbness, tingling, weakness, abdominal pain, or headaches.     The history is provided by the patient.  Back Pain   Past Medical History:  Diagnosis Date  . Asthma   . Chicken pox   . Migraines   . Seasonal allergies     Patient Active Problem List   Diagnosis Date Noted  . Vitamin D deficiency 09/25/2020  . Elevated blood pressure reading without diagnosis of hypertension 09/23/2020  . Acute non intractable tension-type headache 09/23/2020  . Psychophysiological insomnia 09/23/2020  . Stress 09/23/2020  . Fatigue 09/23/2020  . ALLERGIC RHINITIS WITH CONJUNCTIVITIS 05/02/2008  . ASTHMA 05/02/2008  . GASTRITIS 03/03/2007    History reviewed. No pertinent surgical history.     Home Medications    Prior to Admission medications   Medication Sig Start Date End Date Taking? Authorizing Provider  naproxen (NAPROSYN) 500 MG tablet Take 1 tablet (500 mg total) by mouth 2 (two) times daily. 11/10/20  Yes Pearson Forster, NP  Vitamin D, Ergocalciferol, (DRISDOL) 1.25 MG (50000 UNIT) CAPS capsule Take 1 capsule (50,000 Units total) by mouth every 7 (seven) days. 09/25/20  Yes Mayers, Cari S, PA-C  diclofenac (VOLTAREN) 75  MG EC tablet Take 1 tablet (75 mg total) by mouth 2 (two) times daily. 04/04/20   Fransico Meadow, PA-C  hydrOXYzine (ATARAX/VISTARIL) 10 MG tablet Take 1-3 tabs PO QHS PRN for insomnia 09/22/20   Mayers, Cari S, PA-C  methocarbamol (ROBAXIN) 500 MG tablet Take 1 tablet (500 mg total) by mouth 4 (four) times daily. 04/04/20   Fransico Meadow, PA-C  Multiple Vitamin (MULTIVITAMIN ADULT PO) Take 1 tablet by mouth daily.    [provider]  ondansetron (ZOFRAN ODT) 8 MG disintegrating tablet Take 1 tablet (8 mg total) by mouth every 8 (eight) hours as needed for nausea or vomiting. 11/14/19   Domenic Moras, PA-C    Family History Family History  Problem Relation Age of Onset  . Hypertension Mother   . Diabetes Mother   . Stroke Father   . Colon cancer Maternal Aunt     Social History Social History   Tobacco Use  . Smoking status: Never Smoker  . Smokeless tobacco: Never Used  Substance Use Topics  . Alcohol use: Yes    Comment: occasionally  . Drug use: No     Allergies   Banana and Pork-derived products   Review of Systems Review of Systems  Musculoskeletal: Positive for back pain.  All other systems reviewed and are negative.    Physical Exam Triage Vital Signs ED Triage Vitals  Enc Vitals Group  BP 11/10/20 1237 116/70     Pulse Rate 11/10/20 1237 66     Resp 11/10/20 1237 18     Temp 11/10/20 1237 98.7 F (37.1 C)     Temp src --      SpO2 11/10/20 1237 95 %     Weight --      Height --      Head Circumference --      Peak Flow --      Pain Score 11/10/20 1234 6     Pain Loc --      Pain Edu? --      Excl. in [place]? --    No data found.  Updated Vital Signs BP 116/70   Pulse 66   Temp 98.7 F (37.1 C)   Resp 18   SpO2 95%   Visual Acuity Right Eye Distance:   Left Eye Distance:   Bilateral Distance:    Right Eye Near:   Left Eye Near:    Bilateral Near:     Physical Exam Vitals and nursing note reviewed.  Constitutional:       General: He is not in acute distress.    Appearance: Normal appearance. He is not ill-appearing, toxic-appearing or diaphoretic.  HENT:     Head: Normocephalic and atraumatic.  Eyes:     Conjunctiva/sclera: Conjunctivae normal.  Cardiovascular:     Rate and Rhythm: Normal rate.     Pulses: Normal pulses.     Heart sounds: Normal heart sounds.  Pulmonary:     Effort: Pulmonary effort is normal.     Breath sounds: Normal breath sounds.  Abdominal:     General: Abdomen is flat.  Musculoskeletal:        General: Normal range of motion.     Cervical back: Normal and normal range of motion.     Lumbar back: Tenderness and bony tenderness present. No swelling, edema, deformity or signs of trauma. Normal range of motion. Negative right straight leg raise test and negative left straight leg raise test. No scoliosis.  Skin:    General: Skin is warm and dry.  Neurological:     General: No focal deficit present.     Mental Status: He is alert and oriented to person, place, and time.  Psychiatric:        Mood and Affect: Mood normal.      UC Treatments / Results  Labs (all labs ordered are listed, but only abnormal results are displayed) Labs Reviewed - No data to display  EKG   Radiology No results found.  Procedures Procedures (including critical care time)  Medications Ordered in UC Medications - No data to display  Initial Impression / Assessment and Plan / UC Course  I have reviewed the triage vital signs and the nursing notes.  Pertinent labs & imaging results that were available during my care of the patient were reviewed by me and considered in my medical decision making (see chart for details).     Assessment negative for red flags or concerns.  Naproxen twice a day for the next 5-7 days and then as needed.  Recommend using ice for 10-15 minutes or alternating between heat and ice.  May use OTC treatments such as icy/hot.  Encouraged rest and gentle  stretching/exercises.  Follow up with ortho if no improve to symptoms in the next few weeks.  Final Clinical Impressions(s) / UC Diagnoses   Final diagnoses:  Strain of lumbar region, initial encounter  Acute midline low back pain without sciatica     Discharge Instructions     Take the Naproxen twice a day for the next 5-7 days.   You can apply ice for 10-15 minutes every 4-6 hours as needed for pain.  You can also alternate using heat and ice.    You can use icy/hot or lidocaine patches.    Rest and do gentle exercises and stretching.    If your symptoms do not improve in the next few weeks, follow up with orthopedics.     ED Prescriptions    Medication Sig Dispense Auth. Provider   naproxen (NAPROSYN) 500 MG tablet Take 1 tablet (500 mg total) by mouth 2 (two) times daily. 30 tablet Pearson Forster, NP     PDMP not reviewed this encounter.   Pearson Forster, NP 11/10/20 1322

## 2020-11-10 NOTE — Discharge Instructions (Addendum)
Take the Naproxen twice a day for the next 5-7 days.   You can apply ice for 10-15 minutes every 4-6 hours as needed for pain.  You can also alternate using heat and ice.    You can use icy/hot or lidocaine patches.    Rest and do gentle exercises and stretching.    If your symptoms do not improve in the next few weeks, follow up with orthopedics.

## 2020-11-18 ENCOUNTER — Ambulatory Visit: Payer: Medicaid Other | Admitting: Critical Care Medicine

## 2020-11-18 NOTE — Progress Notes (Deleted)
Subjective:    Patient ID: Scott Glenn, male    DOB: [birth year], 50 y.o.   MRN: [id number]  50 y.o.M  New pt to practice.  Hx of HTN, asthma Vit D def  11/18/2020 Saw MAyers at [facility] 3/17 LIEF Scott Glenn reports that he presented to urgent care on September 03, 2020 for a severe headache with photophobia.  He had checked his blood pressure and it was elevated at 148/97.  He was treated with a steroid and Toradol injection and was encouraged to keep a log of his blood pressures at home.  Reports he presented to the emergency department on September 08, 2020 after having continued headaches with this time with a feeling of lightheadedness.  States that he felt like he was going to pass out, no loss of.  Reports he continued to have elevated blood pressure readings at home.  ED note   Eucl[place] Cassetta Whitsettis a 50 y.o.malewith remote medical history of asthma, migraines who presents the emerge department today for dizziness with headache.Normal neuro exam, blood pressure today 139/90, othervital signs normal. Patient states that he has not been taking anything for this, will obtain basic labs and CT imaging since this is worse in the morning. No papilledema on exam. Appears as if this is a migraine.  Work-up today benign, CT imaging without any acutecranial pathology. Upon reevaluation after fluids, Benadryl andReglan patient states that he feels much better. Originally patient felt slightly dizzy with orthostatics, however on repeat evaluation patient does not feel dizzy anymore. Suspect migraine with low p.o. intake, patient states that he is ready to go home and eat, after feeling much better. Patient will follow with PCP. Medication management discussed. Repeat normal neuro.  Doubt need for further emergent work up at this time. I explained the diagnosis and have given explicit precautions to return to the ER including for any other new or worsening  symptoms. The patient understands and accepts the medical plan as it's been dictated and I have answered their questions. Discharge instructions concerning home care and prescriptions have been given. The patient is STABLE and is discharged to home in good condition.  Reports today that he continues to check his blood pressure several times a day, will have occasional elevated readings of 140/80.  Elevated blood pressure reading without diagnosis of hypertension Blood pressure reading today within normal limits.  Encourage patient to continue checking blood pressure at home daily low-sodium diet, increase water intake.    Patient to return to the mobile unit in the morning for fasting labs.  Patient given appointment to establish care with Dr. Joya Glenn in May 2022.  Patient encouraged to return to the mobile unit if needed prior to that visit.   - TSH; Future  2. Stress Patient education given on ways to reduce.  3. Fatigue, unspecified type Patient education given on good sleep hygiene - Testosterone; Future  4. Psychophysiological insomnia Trial hydroxyzine - hydrOXYzine (ATARAX/VISTARIL) 10 MG tablet; Take 1-3 tabs PO QHS PRN for insomnia  Dispense: 30 tablet; Refill: 0  5. Acute non intractable tension-type headache Patient encouraged to increase hydration, work on managing stress,  improving sleep hygiene - TSH; Future - Vitamin D, 25-hydroxy; Future  6. Screening for metabolic disorder  - Comp. Metabolic Panel (12); Future - Lipid panel; Future  7. Screening for HIV without presence of risk factors  - HIV antibody (with reflex); Future  8. Encounter for HCV screening test for low risk patient  - HCV  Ab w/Rflx to Verification; Future  Reports that he does watch his sodium intake, drinks approximately 2-3 bottles of water a day and then mostly soda.  Reports that he has increased stressors, states that he has difficulty sleeping, is able to fall asleep but  has difficulty staying asleep.  Reports he is sleeping approximately 5 hours at night.  Endorses mind racing when he is unable to fall back asleep.  Reports that he does sleep with the TV on to try to help drown out any noises.  Reports he has previously tried melatonin without relief   Reports Significant Family History of father passing from a stroke at a young age, does not know his medical history.  Reports that he has a very active job, does a lot of lifting.  Reports that he will occasionally take testosterone over-the-counter supplement to help him with strength and fatigue.      Review of Systems     Objective:   Physical Exam        Assessment & Plan:

## 2021-02-25 ENCOUNTER — Other Ambulatory Visit: Payer: Self-pay

## 2021-02-25 ENCOUNTER — Encounter: Payer: Self-pay | Admitting: Nurse Practitioner

## 2021-02-25 ENCOUNTER — Ambulatory Visit: Payer: Medicaid Other | Attending: Critical Care Medicine | Admitting: Nurse Practitioner

## 2021-02-25 VITALS — BP 126/88 | HR 69 | Resp 16 | Wt 168.4 lb

## 2021-02-25 DIAGNOSIS — L2082 Flexural eczema: Secondary | ICD-10-CM

## 2021-02-25 DIAGNOSIS — B372 Candidiasis of skin and nail: Secondary | ICD-10-CM | POA: Diagnosis not present

## 2021-02-25 DIAGNOSIS — R7309 Other abnormal glucose: Secondary | ICD-10-CM | POA: Diagnosis not present

## 2021-02-25 DIAGNOSIS — Z1211 Encounter for screening for malignant neoplasm of colon: Secondary | ICD-10-CM

## 2021-02-25 DIAGNOSIS — Z7689 Persons encountering health services in other specified circumstances: Secondary | ICD-10-CM | POA: Diagnosis not present

## 2021-02-25 MED ORDER — CLOTRIMAZOLE 1 % EX CREA: 1.0000 "application " | TOPICAL_CREAM | Freq: Two times a day (BID) | CUTANEOUS | 1 refills | Status: DC

## 2021-02-25 MED ORDER — TRIAMCINOLONE ACETONIDE 0.1 % EX CREA: 1.0000 "application " | TOPICAL_CREAM | Freq: Two times a day (BID) | CUTANEOUS | 1 refills | Status: DC

## 2021-02-25 NOTE — Progress Notes (Signed)
Assessment & Plan:  [name] was seen today for new patient (initial visit) and hypertension.  Diagnoses and all orders for this visit:  Encounter to establish care  Elevated glucose -     CMP14+EGFR -     Hemoglobin A1c  Flexural eczema -     triamcinolone cream (KENALOG) 0.1 %; Apply 1 application topically 2 (two) times daily.  Colon cancer screening -     Ambulatory referral to Gastroenterology  Intertriginous candidiasis -     clotrimazole (LOTRIMIN) 1 % cream; Apply 1 application topically 2 (two) times daily. Apply to groin   Patient has been counseled on age-appropriate routine health concerns for screening and prevention. These are reviewed and up-to-date. Referrals have been placed accordingly. Immunizations are up-to-date or declined.    Subjective:   Chief Complaint  Patient presents with   New Patient (Initial Visit)   Hypertension   HPI Scott Glenn 50 y.o. male presents to office today to establish care  He has a past medical history of Asthma, Chicken pox, Migraines, and Seasonal allergies.    Blood pressure is well controlled. Denies chest pain, shortness of breath, palpitations, lightheadedness, dizziness, headaches or BLE edema.   BP Readings from Last 3 Encounters:  02/25/21 126/88  11/10/20 116/70  09/22/20 123/81     Review of Systems  Constitutional:  Negative for fever, malaise/fatigue and weight loss.  HENT: Negative.  Negative for nosebleeds.   Eyes: Negative.  Negative for blurred vision, double vision and photophobia.  Respiratory: Negative.  Negative for cough and shortness of breath.   Cardiovascular: Negative.  Negative for chest pain, palpitations and leg swelling.  Gastrointestinal: Negative.  Negative for heartburn, nausea and vomiting.  Musculoskeletal: Negative.  Negative for myalgias.  Neurological: Negative.  Negative for dizziness, focal weakness, seizures and headaches.  Psychiatric/Behavioral: Negative.  Negative for  suicidal ideas.    Past Medical History:  Diagnosis Date   Asthma    Chicken pox    Migraines    Seasonal allergies     No past surgical history on file.  Family History  Problem Relation Age of Onset   Hypertension Mother    Diabetes Mother    Stroke Father    Colon cancer Maternal Aunt     Social History Reviewed with no changes to be made today.   Outpatient Medications Prior to Visit  Medication Sig Dispense Refill   diclofenac (VOLTAREN) 75 MG EC tablet Take 1 tablet (75 mg total) by mouth 2 (two) times daily. (Patient not taking: Reported on 02/25/2021) 20 tablet 0   hydrOXYzine (ATARAX/VISTARIL) 10 MG tablet Take 1-3 tabs PO QHS PRN for insomnia (Patient not taking: Reported on 02/25/2021) 30 tablet 0   methocarbamol (ROBAXIN) 500 MG tablet Take 1 tablet (500 mg total) by mouth 4 (four) times daily. (Patient not taking: Reported on 02/25/2021) 20 tablet 0   Multiple Vitamin (MULTIVITAMIN ADULT PO) Take 1 tablet by mouth daily. (Patient not taking: Reported on 02/25/2021)     naproxen (NAPROSYN) 500 MG tablet Take 1 tablet (500 mg total) by mouth 2 (two) times daily. (Patient not taking: Reported on 02/25/2021) 30 tablet 0   ondansetron (ZOFRAN ODT) 8 MG disintegrating tablet Take 1 tablet (8 mg total) by mouth every 8 (eight) hours as needed for nausea or vomiting. (Patient not taking: Reported on 02/25/2021) 12 tablet 0   Vitamin D, Ergocalciferol, (DRISDOL) 1.25 MG (50000 UNIT) CAPS capsule Take 1 capsule (50,000 Units total) by mouth  every 7 (seven) days. (Patient not taking: Reported on 02/25/2021) 4 capsule 2   No facility-administered medications prior to visit.    Allergies  Allergen Reactions   Banana     Itching, dyspnea and nausea   Pork-Derived Products     Headache, diarrhea and vomiting       Objective:    BP 126/88 (BP Location: Right Arm, Patient Position: Sitting, Cuff Size: Normal)   Pulse 69   Resp 16   Wt 168 lb 6.4 oz (76.4 kg)   SpO2 95%   BMI  24.87 kg/m  Wt Readings from Last 3 Encounters:  02/25/21 168 lb 6.4 oz (76.4 kg)  09/22/20 175 lb (79.4 kg)  11/14/19 198 lb 6.6 oz (90 kg)    Physical Exam Vitals and nursing note reviewed.  Constitutional:      Appearance: He is well-developed.  HENT:     Head: Normocephalic and atraumatic.  Cardiovascular:     Rate and Rhythm: Normal rate and regular rhythm.     Heart sounds: Normal heart sounds. No murmur heard.   No friction rub. No gallop.  Pulmonary:     Effort: Pulmonary effort is normal. No tachypnea or respiratory distress.     Breath sounds: Normal breath sounds. No decreased breath sounds, wheezing, rhonchi or rales.  Chest:     Chest wall: No tenderness.  Abdominal:     General: Bowel sounds are normal.     Palpations: Abdomen is soft.  Musculoskeletal:        General: Normal range of motion.     Cervical back: Normal range of motion.  Skin:    General: Skin is warm and dry.       Neurological:     Mental Status: He is alert and oriented to person, place, and time.     Coordination: Coordination normal.  Psychiatric:        Behavior: Behavior normal. Behavior is cooperative.        Thought Content: Thought content normal.        Judgment: Judgment normal.         Patient has been counseled extensively about nutrition and exercise as well as the importance of adherence with medications and regular follow-up. The patient was given clear instructions to go to ER or return to medical center if symptoms don't improve, worsen or new problems develop. The patient verbalized understanding.   Follow-up: Return for F/U with Lurena Joiner in 3 weeks for BP check. See me in 3 months.   Gildardo Pounds, FNP-BC [facility] and [facility], [place]   02/25/2021, 2:53 PM

## 2021-02-26 LAB — CMP14+EGFR
ALT: 25 IU/L (ref 0–44)
AST: 42 IU/L — ABNORMAL HIGH (ref 0–40)
Albumin/Globulin Ratio: 2.4 — ABNORMAL HIGH (ref 1.2–2.2)
Albumin: 5 g/dL (ref 4.0–5.0)
Alkaline Phosphatase: 65 IU/L (ref 44–121)
BUN/Creatinine Ratio: 12 (ref 9–20)
BUN: 13 mg/dL (ref 6–24)
Bilirubin Total: 1.7 mg/dL — ABNORMAL HIGH (ref 0.0–1.2)
CO2: 23 mmol/L (ref 20–29)
Calcium: 9.6 mg/dL (ref 8.7–10.2)
Chloride: 102 mmol/L (ref 96–106)
Creatinine, Ser: 1.12 mg/dL (ref 0.76–1.27)
Globulin, Total: 2.1 g/dL (ref 1.5–4.5)
Glucose: 83 mg/dL (ref 65–99)
Potassium: 4 mmol/L (ref 3.5–5.2)
Sodium: 142 mmol/L (ref 134–144)
Total Protein: 7.1 g/dL (ref 6.0–8.5)
eGFR: 81 mL/min/{1.73_m2} (ref >59)

## 2021-02-26 LAB — HEMOGLOBIN A1C
Est. average glucose Bld gHb Est-mCnc: 94 mg/dL
Hgb A1c MFr Bld: 4.9 % (ref 4.8–5.6)

## 2021-03-16 ENCOUNTER — Telehealth: Payer: Self-pay | Admitting: Nurse Practitioner

## 2021-03-16 NOTE — Telephone Encounter (Signed)
Left patient a voicemail stating that he does not need to come into the office for blood pressure check on September 14 and will see him on November 28 for his office visit with me.

## 2021-03-18 ENCOUNTER — Ambulatory Visit: Payer: Medicaid Other | Admitting: Pharmacist

## 2021-06-01 ENCOUNTER — Encounter: Payer: Self-pay | Admitting: Nurse Practitioner

## 2021-06-01 ENCOUNTER — Ambulatory Visit: Payer: Medicaid Other | Attending: Nurse Practitioner | Admitting: Nurse Practitioner

## 2021-06-01 ENCOUNTER — Other Ambulatory Visit: Payer: Self-pay

## 2021-06-01 VITALS — BP 121/79 | HR 87 | Ht 69.0 in | Wt 175.0 lb

## 2021-06-01 DIAGNOSIS — R030 Elevated blood-pressure reading, without diagnosis of hypertension: Secondary | ICD-10-CM

## 2021-06-01 DIAGNOSIS — R17 Unspecified jaundice: Secondary | ICD-10-CM | POA: Diagnosis not present

## 2021-06-01 NOTE — Progress Notes (Signed)
Assessment & Plan:  Scott Glenn was seen today for blood pressure check.  Diagnoses and all orders for this visit:  Elevated blood pressure reading without diagnosis of hypertension  Elevated bilirubin -     CMP14+EGFR   Patient has been counseled on age-appropriate routine health concerns for screening and prevention. These are reviewed and up-to-date. Referrals have been placed accordingly. Immunizations are up-to-date or declined.    Subjective:   Chief Complaint  Patient presents with   Blood Pressure Check   HPI Scott Glenn 50 y.o. male presents to office today for follow up to past elevated blood pressure readings He has a past medical history of Asthma, Chicken pox, Migraines, and Seasonal allergies.   Notes blood pressure at home sometimes as high as 160-170/90-100s. Blood pressure has been normal here at each office visit. He does endorse increased stress.  BP Readings from Last 3 Encounters:  06/01/21 121/79  02/25/21 126/88  11/10/20 116/70    Bilirubin elevated. He does endorse drinking alcohol a few times a month mostly occurring on the weekend but not during the week and only 2 weekends out of the month on average.     Review of Systems  Constitutional:  Negative for fever, malaise/fatigue and weight loss.  HENT: Negative.  Negative for nosebleeds.   Eyes: Negative.  Negative for blurred vision, double vision and photophobia.  Respiratory: Negative.  Negative for cough and shortness of breath.   Cardiovascular: Negative.  Negative for chest pain, palpitations and leg swelling.  Gastrointestinal: Negative.  Negative for heartburn, nausea and vomiting.  Musculoskeletal: Negative.  Negative for myalgias.  Neurological: Negative.  Negative for dizziness, focal weakness, seizures and headaches.  Psychiatric/Behavioral: Negative.  Negative for suicidal ideas.    Past Medical History:  Diagnosis Date   Asthma    Chicken pox    Migraines    Seasonal  allergies     History reviewed. No pertinent surgical history.  Family History  Problem Relation Age of Onset   Hypertension Mother    Diabetes Mother    Stroke Father    Colon cancer Maternal Aunt     Social History Reviewed with no changes to be made today.   Outpatient Medications Prior to Visit  Medication Sig Dispense Refill   triamcinolone cream (KENALOG) 0.1 % Apply 1 application topically 2 (two) times daily. 60 g 1   clotrimazole (LOTRIMIN) 1 % cream Apply 1 application topically 2 (two) times daily. Apply to groin (Patient not taking: Reported on 06/01/2021) 60 g 1   No facility-administered medications prior to visit.    Allergies  Allergen Reactions   Banana     Itching, dyspnea and nausea   Pork-Derived Products     Headache, diarrhea and vomiting       Objective:    BP 121/79   Pulse 87   Ht $R'5\' 9"'gN$  (1.753 m)   Wt 175 lb (79.4 kg)   SpO2 98%   BMI 25.84 kg/m  Wt Readings from Last 3 Encounters:  06/01/21 175 lb (79.4 kg)  02/25/21 168 lb 6.4 oz (76.4 kg)  09/22/20 175 lb (79.4 kg)    Physical Exam Vitals and nursing note reviewed.  Constitutional:      Appearance: He is well-developed.  HENT:     Head: Normocephalic and atraumatic.  Cardiovascular:     Rate and Rhythm: Normal rate and regular rhythm.     Heart sounds: Normal heart sounds. No murmur heard.   No  friction rub. No gallop.  Pulmonary:     Effort: Pulmonary effort is normal. No tachypnea or respiratory distress.     Breath sounds: Normal breath sounds. No decreased breath sounds, wheezing, rhonchi or rales.  Chest:     Chest wall: No tenderness.  Abdominal:     General: Bowel sounds are normal.     Palpations: Abdomen is soft.  Musculoskeletal:        General: Normal range of motion.     Cervical back: Normal range of motion.  Skin:    General: Skin is warm and dry.  Neurological:     Mental Status: He is alert and oriented to person, place, and time.     Coordination:  Coordination normal.  Psychiatric:        Behavior: Behavior normal. Behavior is cooperative.        Thought Content: Thought content normal.        Judgment: Judgment normal.         Patient has been counseled extensively about nutrition and exercise as well as the importance of adherence with medications and regular follow-up. The patient was given clear instructions to go to ER or return to medical center if symptoms don't improve, worsen or new problems develop. The patient verbalized understanding.   Follow-up: Return in about 6 months (around 11/29/2021).   Gildardo Pounds, FNP-BC [facility] and [facility], [place]   06/01/2021, 2:00 PM

## 2021-06-01 NOTE — Patient Instructions (Signed)
Placed in [place] [address]  Ph# [phone] Fax [phone].

## 2021-06-02 LAB — CMP14+EGFR
ALT: 23 IU/L (ref 0–44)
AST: 30 IU/L (ref 0–40)
Albumin/Globulin Ratio: 2.5 — ABNORMAL HIGH (ref 1.2–2.2)
Albumin: 4.7 g/dL (ref 4.0–5.0)
Alkaline Phosphatase: 66 IU/L (ref 44–121)
BUN/Creatinine Ratio: 15 (ref 9–20)
BUN: 15 mg/dL (ref 6–24)
Bilirubin Total: 1.1 mg/dL (ref 0.0–1.2)
CO2: 22 mmol/L (ref 20–29)
Calcium: 9.2 mg/dL (ref 8.7–10.2)
Chloride: 103 mmol/L (ref 96–106)
Creatinine, Ser: 1 mg/dL (ref 0.76–1.27)
Globulin, Total: 1.9 g/dL (ref 1.5–4.5)
Glucose: 91 mg/dL (ref 70–99)
Potassium: 3.9 mmol/L (ref 3.5–5.2)
Sodium: 141 mmol/L (ref 134–144)
Total Protein: 6.6 g/dL (ref 6.0–8.5)
eGFR: 92 mL/min/{1.73_m2} (ref >59)

## 2021-07-02 ENCOUNTER — Encounter: Payer: Self-pay | Admitting: Gastroenterology

## 2021-08-05 ENCOUNTER — Ambulatory Visit (HOSPITAL_COMMUNITY)
Admission: EM | Admit: 2021-08-05 | Discharge: 2021-08-05 | Disposition: A | Discharge status: Home / Self Care | Payer: Medicaid Other | Attending: Physician Assistant | Admitting: Physician Assistant

## 2021-08-05 ENCOUNTER — Encounter (HOSPITAL_COMMUNITY): Payer: Self-pay

## 2021-08-05 ENCOUNTER — Other Ambulatory Visit: Payer: Self-pay

## 2021-08-05 DIAGNOSIS — J069 Acute upper respiratory infection, unspecified: Secondary | ICD-10-CM | POA: Diagnosis present

## 2021-08-05 DIAGNOSIS — R051 Acute cough: Secondary | ICD-10-CM | POA: Insufficient documentation

## 2021-08-05 DIAGNOSIS — Z20822 Contact with and (suspected) exposure to covid-19: Secondary | ICD-10-CM | POA: Insufficient documentation

## 2021-08-05 LAB — POC INFLUENZA A AND B ANTIGEN (URGENT CARE ONLY)
INFLUENZA A ANTIGEN, POC: NEGATIVE
INFLUENZA B ANTIGEN, POC: NEGATIVE

## 2021-08-05 MED ORDER — FLUTICASONE PROPIONATE 50 MCG/ACT NA SUSP: 1.0000 | Freq: Every day | NASAL | 0 refills | Status: DC

## 2021-08-05 MED ORDER — PROMETHAZINE-DM 6.25-15 MG/5ML PO SYRP: 5.0000 mL | ORAL_SOLUTION | Freq: Three times a day (TID) | ORAL | 0 refills | Status: DC | PRN

## 2021-08-05 NOTE — ED Triage Notes (Signed)
Pt presents with c/o runny nose, no energy and body aches x 2 days.

## 2021-08-05 NOTE — ED Provider Notes (Signed)
[place]    CSN: [id number] Arrival date & time: 08/05/21  0263      History   Chief Complaint Chief Complaint  Patient presents with   Cough   Generalized Body Aches    HPI Scott Glenn is a 51 y.o. male.   Patient presents today with a 2-day history of URI symptoms.  Reports cough, body aches, fatigue, nasal congestion, sore throat.  Denies any chest pain, shortness of breath, nausea, vomiting, diarrhea.  He has not tried any over-the-counter medication for symptom management.  Denies any known sick contacts but does work around many individuals.  He has had COVID-19 vaccine but has not had flu shot.  He has not had COVID in the past.  Denies any recent antibiotics.  He does have a history of asthma when he was younger but has not required albuterol inhaler during adulthood and denies any current symptoms.   Past Medical History:  Diagnosis Date   Asthma    Chicken pox    Migraines    Seasonal allergies     Patient Active Problem List   Diagnosis Date Noted   Vitamin D deficiency 09/25/2020   Elevated blood pressure reading without diagnosis of hypertension 09/23/2020   Acute non intractable tension-type headache 09/23/2020   Psychophysiological insomnia 09/23/2020   Stress 09/23/2020   Fatigue 09/23/2020   ALLERGIC RHINITIS WITH CONJUNCTIVITIS 05/02/2008   ASTHMA 05/02/2008   GASTRITIS 03/03/2007    History reviewed. No pertinent surgical history.     Home Medications    Prior to Admission medications   Medication Sig Start Date End Date Taking? Authorizing Provider  fluticasone (FLONASE) 50 MCG/ACT nasal spray Place 1 spray into both nostrils daily. 08/05/21  Yes Batoul Limes K, PA-C  promethazine-dextromethorphan (PROMETHAZINE-DM) 6.25-15 MG/5ML syrup Take 5 mLs by mouth 3 (three) times daily as needed for cough. 08/05/21  Yes Everlyn Farabaugh, Derry Skill, PA-C  clotrimazole (LOTRIMIN) 1 % cream Apply 1 application topically 2 (two) times daily. Apply to  groin Patient not taking: Reported on 06/01/2021 02/25/21   Gildardo Pounds, NP  triamcinolone cream (KENALOG) 0.1 % Apply 1 application topically 2 (two) times daily. 02/25/21   Gildardo Pounds, NP    Family History Family History  Problem Relation Age of Onset   Hypertension Mother    Diabetes Mother    Stroke Father    Colon cancer Maternal Aunt     Social History Social History   Tobacco Use   Smoking status: Never   Smokeless tobacco: Never  Vaping Use   Vaping Use: Never used  Substance Use Topics   Alcohol use: Yes    Comment: occasionally   Drug use: No     Allergies   Banana and Pork-derived products   Review of Systems Review of Systems  Constitutional:  Positive for activity change and fatigue. Negative for appetite change and fever.  HENT:  Positive for congestion and sore throat. Negative for sinus pressure and sneezing.   Respiratory:  Positive for cough. Negative for shortness of breath.   Cardiovascular:  Negative for chest pain.  Gastrointestinal:  Negative for abdominal pain, diarrhea, nausea and vomiting.  Musculoskeletal:  Positive for arthralgias and myalgias.  Neurological:  Positive for headaches. Negative for dizziness and light-headedness.    Physical Exam Triage Vital Signs ED Triage Vitals  Enc Vitals Group     BP 08/05/21 1129 137/89     Pulse Rate 08/05/21 1129 73  Resp 08/05/21 1129 18     Temp 08/05/21 1129 99.4 F (37.4 C)     Temp Source 08/05/21 1129 Oral     SpO2 08/05/21 1129 97 %     Weight --      Height --      Head Circumference --      Peak Flow --      Pain Score 08/05/21 1128 4     Pain Loc --      Pain Edu? --      Excl. in [place]? --    No data found.  Updated Vital Signs BP 137/89 (BP Location: Right Arm)    Pulse 73    Temp 99.4 F (37.4 C) (Oral)    Resp 18    SpO2 97%   Visual Acuity Right Eye Distance:   Left Eye Distance:   Bilateral Distance:    Right Eye Near:   Left Eye Near:    Bilateral  Near:     Physical Exam Vitals reviewed.  Constitutional:      General: He is awake.     Appearance: Normal appearance. He is well-developed. He is not ill-appearing.     Comments: Very pleasant male appears stated age no acute distress sitting comfortably in exam  HENT:     Head: Normocephalic and atraumatic.     Right Ear: Tympanic membrane, ear canal and external ear normal. Tympanic membrane is not erythematous or bulging.     Left Ear: Tympanic membrane, ear canal and external ear normal. Tympanic membrane is not erythematous or bulging.     Nose: Nose normal.     Mouth/Throat:     Pharynx: Uvula midline. Posterior oropharyngeal erythema present. No oropharyngeal exudate or uvula swelling.  Cardiovascular:     Rate and Rhythm: Normal rate and regular rhythm.     Heart sounds: Normal heart sounds, S1 normal and S2 normal. No murmur heard. Pulmonary:     Effort: Pulmonary effort is normal. No accessory muscle usage or respiratory distress.     Breath sounds: Normal breath sounds. No stridor. No wheezing, rhonchi or rales.     Comments: Clear to auscultation bilaterally Abdominal:     General: Bowel sounds are normal.     Palpations: Abdomen is soft.     Tenderness: There is no abdominal tenderness.  Neurological:     Mental Status: He is alert.  Psychiatric:        Behavior: Behavior is cooperative.     UC Treatments / Results  Labs (all labs ordered are listed, but only abnormal results are displayed) Labs Reviewed  SARS CORONAVIRUS 2 (TAT 6-24 HRS)  POC INFLUENZA A AND B ANTIGEN (URGENT CARE ONLY)    EKG   Radiology No results found.  Procedures Procedures (including critical care time)  Medications Ordered in UC Medications - No data to display  Initial Impression / Assessment and Plan / UC Course  I have reviewed the triage vital signs and the nursing notes.  Pertinent labs & imaging results that were available during my care of the patient were reviewed  by me and considered in my medical decision making (see chart for details).     Discussed likely viral etiology given short duration of symptoms.  No evidence of acute infection that would warrant initiation of antibiotics.  Flu testing was negative in clinic.  COVID test is pending.  Patient was given work excuse note with current CDC return to work guidelines based on  COVID test result.  He was given Promethazine DM for cough with instruction not to drive or drink alcohol with this medication as drowsiness is a common side effect.  Recommended to use Flonase as well as over-the-counter Mucinex for congestion.  He can alternate Tylenol and ibuprofen for pain.  Recommended he rest and drink plenty of fluid.  Discussed that if he has any worsening symptoms including chest pain, shortness of breath, high fever not responding to medication, nausea/vomiting interfere with oral intake he needs to go to the emergency room.  Strict return precautions given to which he expressed understanding.  Final Clinical Impressions(s) / UC Diagnoses   Final diagnoses:  Upper respiratory tract infection, unspecified type  Acute cough     Discharge Instructions      Your flu test was negative.  We will contact you for COVID test is positive.  Please monitor your MyChart for these test results.  Use Promethazine DM for cough.  This can make you sleepy so do not drive or drink alcohol with taking it.  Use Flonase for congestion.  You can also use Mucinex for cough and congestion and alternate Tylenol and ibuprofen for fever and pain.  Make sure you rest and drink plenty of fluid.  If you have any worsening symptoms including fever, shortness of breath, chest pain, nausea/vomiting interfering with oral intake you need to go to the emergency room for further evaluation and management.  If symptoms not improved by next week please return here or see your PCP.     ED Prescriptions     Medication Sig Dispense Auth.  Provider   promethazine-dextromethorphan (PROMETHAZINE-DM) 6.25-15 MG/5ML syrup Take 5 mLs by mouth 3 (three) times daily as needed for cough. 118 mL Ladora Osterberg K, PA-C   fluticasone (FLONASE) 50 MCG/ACT nasal spray Place 1 spray into both nostrils daily. 16 g Teena Mangus K, PA-C      PDMP not reviewed this encounter.   Terrilee Croak, PA-C 08/05/21 1219

## 2021-08-05 NOTE — Discharge Instructions (Addendum)
Your flu test was negative.  We will contact you for COVID test is positive.  Please monitor your MyChart for these test results.  Use Promethazine DM for cough.  This can make you sleepy so do not drive or drink alcohol with taking it.  Use Flonase for congestion.  You can also use Mucinex for cough and congestion and alternate Tylenol and ibuprofen for fever and pain.  Make sure you rest and drink plenty of fluid.  If you have any worsening symptoms including fever, shortness of breath, chest pain, nausea/vomiting interfering with oral intake you need to go to the emergency room for further evaluation and management.  If symptoms not improved by next week please return here or see your PCP.

## 2021-08-06 LAB — SARS CORONAVIRUS 2 (TAT 6-24 HRS): SARS Coronavirus 2: NEGATIVE

## 2021-08-17 ENCOUNTER — Ambulatory Visit (AMBULATORY_SURGERY_CENTER): Payer: Medicaid Other | Admitting: *Deleted

## 2021-08-17 ENCOUNTER — Other Ambulatory Visit: Payer: Self-pay

## 2021-08-17 VITALS — Ht 69.0 in | Wt 180.0 lb

## 2021-08-17 DIAGNOSIS — Z1211 Encounter for screening for malignant neoplasm of colon: Secondary | ICD-10-CM

## 2021-08-17 MED ORDER — NA SULFATE-K SULFATE-MG SULF 17.5-3.13-1.6 GM/177ML PO SOLN: 2.0000 | Freq: Once | ORAL | 0 refills | Status: AC

## 2021-08-17 NOTE — Progress Notes (Signed)

## 2021-08-31 ENCOUNTER — Other Ambulatory Visit: Payer: Self-pay

## 2021-08-31 ENCOUNTER — Encounter: Payer: Self-pay | Admitting: Gastroenterology

## 2021-08-31 ENCOUNTER — Ambulatory Visit (AMBULATORY_SURGERY_CENTER): Payer: Medicaid Other | Admitting: Gastroenterology

## 2021-08-31 VITALS — BP 129/86 | HR 61 | Temp 96.6°F | Resp 12 | Ht 69.0 in | Wt 180.0 lb

## 2021-08-31 DIAGNOSIS — Z1211 Encounter for screening for malignant neoplasm of colon: Secondary | ICD-10-CM | POA: Diagnosis not present

## 2021-08-31 DIAGNOSIS — D123 Benign neoplasm of transverse colon: Secondary | ICD-10-CM

## 2021-08-31 DIAGNOSIS — D125 Benign neoplasm of sigmoid colon: Secondary | ICD-10-CM

## 2021-08-31 DIAGNOSIS — D127 Benign neoplasm of rectosigmoid junction: Secondary | ICD-10-CM

## 2021-08-31 MED ORDER — SODIUM CHLORIDE 0.9 % IV SOLN: 500.0000 mL | Freq: Once | INTRAVENOUS | Status: DC

## 2021-08-31 NOTE — Op Note (Signed)
[place] Patient Name: Scott Glenn Procedure Date: 08/31/2021 12:03 PM MRN: [id number] Endoscopist: Nicki Reaper E. Candis Schatz , MD Age: 51 Referring MD:  Date of Birth: 12-23-1970 Gender: Male Account #: [id number] Procedure:                Colonoscopy Indications:              Screening for colorectal malignant neoplasm, This                            is the patient's first colonoscopy Medicines:                Monitored Anesthesia Care Procedure:                Pre-Anesthesia Assessment:                           - Prior to the procedure, a History and Physical                            was performed, and patient medications and                            allergies were reviewed. The patient's tolerance of                            previous anesthesia was also reviewed. The risks                            and benefits of the procedure and the sedation                            options and risks were discussed with the patient.                            All questions were answered, and informed consent                            was obtained. Prior Anticoagulants: The patient has                            taken no previous anticoagulant or antiplatelet                            agents. ASA Grade Assessment: II - A patient with                            mild systemic disease. After reviewing the risks                            and benefits, the patient was deemed in                            satisfactory condition to undergo the procedure.  After obtaining informed consent, the colonoscope                            was passed under direct vision. Throughout the                            procedure, the patient's blood pressure, pulse, and                            oxygen saturations were monitored continuously. The                            CF [id number] #[id number] was introduced through the anus                            and advanced to  the the cecum, identified by                            appendiceal orifice and ileocecal valve. The                            colonoscopy was performed without difficulty. The                            patient tolerated the procedure well. The quality                            of the bowel preparation was adequate. The                            ileocecal valve, appendiceal orifice, and rectum                            were photographed. The bowel preparation used was                            SUPREP via split dose instruction. Scope In: 12:09:28 PM Scope Out: 12:29:58 PM Scope Withdrawal Time: 0 hours 17 minutes 34 seconds  Total Procedure Duration: 0 hours 20 minutes 30 seconds  Findings:                 The perianal and digital rectal examinations were                            normal. Pertinent negatives include normal                            sphincter tone and no palpable rectal lesions.                           A 2 mm polyp was found in the transverse colon. The                            polyp was sessile. The polyp was removed  with a                            cold biopsy forceps. Resection and retrieval were                            complete. Estimated blood loss was minimal.                           A 4 mm polyp was found in the transverse colon. The                            polyp was sessile. The polyp was removed with a                            cold snare. Resection was complete, but the polyp                            tissue was not retrieved. Estimated blood loss was                            minimal.                           A 3 mm polyp was found in the sigmoid colon. The                            polyp was flat. The polyp was removed with a cold                            snare. Resection and retrieval were complete.                            Estimated blood loss was minimal.                           A 4 mm polyp was found in the recto-sigmoid  colon.                            The polyp was sessile. The polyp was removed with a                            cold snare. Resection and retrieval were complete.                            Estimated blood loss was minimal.                           A few small-mouthed diverticula were found in the                            sigmoid colon.  The exam was otherwise normal throughout the                            examined colon.                           The retroflexed view of the distal rectum and anal                            verge was normal and showed no anal or rectal                            abnormalities. Complications:            No immediate complications. Estimated Blood Loss:     Estimated blood loss was minimal. Impression:               - One 2 mm polyp in the transverse colon, removed                            with a cold biopsy forceps. Resected and retrieved.                           - One 4 mm polyp in the transverse colon, removed                            with a cold snare. Complete resection. Polyp tissue                            not retrieved.                           - One 3 mm polyp in the sigmoid colon, removed with                            a cold snare. Resected and retrieved.                           - One 4 mm polyp at the recto-sigmoid colon,                            removed with a cold snare. Resected and retrieved.                           - Diverticulosis in the sigmoid colon.                           - The distal rectum and anal verge are normal on                            retroflexion view. Recommendation:           - Patient has a contact number available for  emergencies. The signs and symptoms of potential                            delayed complications were discussed with the                            patient. Return to normal activities tomorrow.                             Written discharge instructions were provided to the                            patient.                           - Resume previous diet.                           - Continue present medications.                           - Await pathology results.                           - Repeat colonoscopy (date not yet determined) for                            surveillance based on pathology results. Eriel Dunckel E. Candis Schatz, MD 08/31/2021 12:35:14 PM This report has been signed electronically.

## 2021-08-31 NOTE — Progress Notes (Signed)
PT taken to PACU. Monitors in place. VSS. Report given to RN. 

## 2021-08-31 NOTE — Progress Notes (Signed)
Called to room to assist during endoscopic procedure.  Patient ID and intended procedure confirmed with present staff. Received instructions for my participation in the procedure from the performing physician.  

## 2021-08-31 NOTE — Progress Notes (Signed)
Vitals-DT  Pt's states no medical or surgical changes since previsit or office visit.   Patient drank 20 oz of water 10 minutes ago. Will be delayed 2 hrs per CRNA.

## 2021-08-31 NOTE — Progress Notes (Signed)
[facility] History and Physical   Primary Care Physician:  Gildardo Pounds, NP   Reason for Procedure:   Colon cancer screening  Plan:    Screening colonoscopy     HPI: Scott Glenn is a 51 y.o. male undergoing initial average risk screening colonoscopy.  He has no family history of colon cancer and no chronic GI symptoms.    Past Medical History:  Diagnosis Date   Asthma    Chicken pox    Migraines    Seasonal allergies     History reviewed. No pertinent surgical history.  Prior to Admission medications   Medication Sig Start Date End Date Taking? Authorizing Provider  clotrimazole (LOTRIMIN) 1 % cream Apply 1 application topically 2 (two) times daily. Apply to groin Patient not taking: Reported on 06/01/2021 02/25/21   Gildardo Pounds, NP  triamcinolone cream (KENALOG) 0.1 % Apply 1 application topically 2 (two) times daily. Patient not taking: Reported on 08/17/2021 02/25/21   Gildardo Pounds, NP    Current Outpatient Medications  Medication Sig Dispense Refill   clotrimazole (LOTRIMIN) 1 % cream Apply 1 application topically 2 (two) times daily. Apply to groin (Patient not taking: Reported on 06/01/2021) 60 g 1   triamcinolone cream (KENALOG) 0.1 % Apply 1 application topically 2 (two) times daily. (Patient not taking: Reported on 08/17/2021) 60 g 1   Current Facility-Administered Medications  Medication Dose Route Frequency Provider Last Rate Last Admin   0.9 %  sodium chloride infusion  500 mL Intravenous Once Daryel November, MD        Allergies as of 08/31/2021 - Review Complete 08/31/2021  Allergen Reaction Noted   Banana  10/03/2013   Pork-derived products  10/03/2013    Family History  Problem Relation Age of Onset   Hypertension Mother    Diabetes Mother    Stroke Father    Colon cancer Maternal Aunt    Colon polyps Neg Hx    Esophageal cancer Neg Hx    Stomach cancer Neg Hx    Rectal cancer Neg Hx     Social History    Socioeconomic History   Marital status: Married    Spouse name: Not on file   Number of children: Not on file   Years of education: Not on file   Highest education level: Not on file  Occupational History   Not on file  Tobacco Use   Smoking status: Never   Smokeless tobacco: Never  Vaping Use   Vaping Use: Never used  Substance and Sexual Activity   Alcohol use: Yes    Comment: occasionally   Drug use: No   Sexual activity: Not on file  Other Topics Concern   Not on file  Social History Narrative   Not on file   Social Determinants of Health   Financial Resource Strain: Not on file  Food Insecurity: Not on file  Transportation Needs: Not on file  Physical Activity: Not on file  Stress: Not on file  Social Connections: Not on file  Intimate Partner Violence: Not on file    Review of Systems:  All other review of systems negative except as mentioned in the HPI.  Physical Exam: Vital signs BP 115/72 (BP Location: Right Arm, Patient Position: Sitting, Cuff Size: Normal)    Pulse 61    Temp (!) 96.6 F (35.9 C) (Temporal)    Ht 5\' 9"  (1.753 m)    Wt 180 lb (81.6 kg)  SpO2 99%    BMI 26.58 kg/m   General:   Alert,  Well-developed, well-nourished, pleasant and cooperative in NAD Airway:  Mallampati 2 Lungs:  Clear throughout to auscultation.   Heart:  Regular rate and rhythm; no murmurs, clicks, rubs,  or gallops. Abdomen:  Soft, nontender and nondistended. Normal bowel sounds.   Neuro/Psych:  Normal mood and affect. A and O x 3   [name] E. Candis Schatz, MD [facility]

## 2021-08-31 NOTE — Patient Instructions (Signed)
YOU HAD AN ENDOSCOPIC PROCEDURE TODAY AT THE [facility]:   Refer to the procedure report that was given to you for any specific questions about what was found during the examination.  If the procedure report does not answer your questions, please call your gastroenterologist to clarify.  If you requested that your care partner not be given the details of your procedure findings, then the procedure report has been included in a sealed envelope for you to review at your convenience later.  YOU SHOULD EXPECT: Some feelings of bloating in the abdomen. Passage of more gas than usual.  Walking can help get rid of the air that was put into your GI tract during the procedure and reduce the bloating. If you had a lower endoscopy (such as a colonoscopy or flexible sigmoidoscopy) you may notice spotting of blood in your stool or on the toilet paper. If you underwent a bowel prep for your procedure, you may not have a normal bowel movement for a few days.  Please Note:  You might notice some irritation and congestion in your nose or some drainage.  This is from the oxygen used during your procedure.  There is no need for concern and it should clear up in a day or so.  SYMPTOMS TO REPORT IMMEDIATELY:   Following lower endoscopy (colonoscopy or flexible sigmoidoscopy):  Excessive amounts of blood in the stool  Significant tenderness or worsening of abdominal pains  Swelling of the abdomen that is new, acute  Fever of 100F or higher  For urgent or emergent issues, a gastroenterologist can be reached at any hour by calling [phone]. Do not use MyChart messaging for urgent concerns.    DIET:  We do recommend a small meal at first, but then you may proceed to your regular diet.  Drink plenty of fluids but you should avoid alcoholic beverages for 24 hours.  ACTIVITY:  You should plan to take it easy for the rest of today and you should NOT DRIVE or use heavy machinery until tomorrow (because  of the sedation medicines used during the test).    FOLLOW UP: Our staff will call the number listed on your records 48-72 hours following your procedure to check on you and address any questions or concerns that you may have regarding the information given to you following your procedure. If we do not reach you, we will leave a message.  We will attempt to reach you two times.  During this call, we will ask if you have developed any symptoms of COVID 19. If you develop any symptoms (ie: fever, flu-like symptoms, shortness of breath, cough etc.) before then, please call [phone].  If you test positive for Covid 19 in the 2 weeks post procedure, please call and report this information to us.    If any biopsies were taken you will be contacted by phone or by letter within the next 1-3 weeks.  Please call us at [phone] if you have not heard about the biopsies in 3 weeks.    SIGNATURES/CONFIDENTIALITY: You and/or your care partner have signed paperwork which will be entered into your electronic medical record.  These signatures attest to the fact that that the information above on your After Visit Summary has been reviewed and is understood.  Full responsibility of the confidentiality of this discharge information lies with you and/or your care-partner. 

## 2021-09-02 ENCOUNTER — Telehealth: Payer: Self-pay

## 2021-09-02 NOTE — Telephone Encounter (Signed)
?  Follow up Call- ? ?Call back number 08/31/2021  ?Post procedure Call Back phone  # [phone]  ?Permission to leave phone message Yes  ?Some recent data might be hidden  ?  ? ?Patient questions: ? ?Do you have a fever, pain , or abdominal swelling? No. ?Pain Score  0 * ? ?Have you tolerated food without any problems? Yes.   ? ?Have you been able to return to your normal activities? Yes.   ? ?Do you have any questions about your discharge instructions: ?Diet   No. ?Medications  No. ?Follow up visit  No. ? ?Do you have questions or concerns about your Care? No. ? ?Actions: ?* If pain score is 4 or above: ?No action needed, pain <4. ? ? ?Follow up Call- ? ?Call back number 08/31/2021  ?Post procedure Call Back phone  # [phone]  ?Permission to leave phone message Yes  ?Some recent data might be hidden  ?  ? ?Patient questions: ? ?Do you have a fever, pain , or abdominal swelling? No. ?Pain Score  0 * ? ?Have you tolerated food without any problems? No. ? ?Have you been able to return to your normal activities? No. ? ?Do you have any questions about your discharge instructions: ?Diet   No. ?Medications  No. ?Follow up visit  No. ? ?Do you have questions or concerns about your Care? No. ? ?Actions: ?* If pain score is 4 or above: ?No action needed, pain <4. ? ? ?

## 2021-09-07 ENCOUNTER — Encounter: Payer: Self-pay | Admitting: Gastroenterology

## 2021-09-21 ENCOUNTER — Ambulatory Visit (HOSPITAL_COMMUNITY)
Admission: EM | Admit: 2021-09-21 | Discharge: 2021-09-21 | Disposition: A | Discharge status: Home / Self Care | Payer: Medicaid Other | Attending: Sports Medicine | Admitting: Sports Medicine

## 2021-09-21 ENCOUNTER — Encounter (HOSPITAL_COMMUNITY): Payer: Self-pay | Admitting: *Deleted

## 2021-09-21 ENCOUNTER — Ambulatory Visit (INDEPENDENT_AMBULATORY_CARE_PROVIDER_SITE_OTHER): Payer: Medicaid Other

## 2021-09-21 ENCOUNTER — Other Ambulatory Visit: Payer: Self-pay

## 2021-09-21 DIAGNOSIS — S80212A Abrasion, left knee, initial encounter: Secondary | ICD-10-CM | POA: Diagnosis not present

## 2021-09-21 DIAGNOSIS — M79672 Pain in left foot: Secondary | ICD-10-CM

## 2021-09-21 DIAGNOSIS — M79675 Pain in left toe(s): Secondary | ICD-10-CM | POA: Diagnosis not present

## 2021-09-21 DIAGNOSIS — S90212A Contusion of left great toe with damage to nail, initial encounter: Secondary | ICD-10-CM | POA: Diagnosis not present

## 2021-09-21 NOTE — ED Triage Notes (Signed)
This morning Pt was moving a Dresser and the dresser fell onto his Lt knee and injured Lt big toe. Blood observed on Pt' sock . Pt reports dresser weight 60lbs. Pt moves furniture for a job. ?

## 2021-09-21 NOTE — Discharge Instructions (Addendum)
Ice the toe, place in warm epsom salt bath ?May apply bacitracin or antibiotic ointment over top of toenail ? ?Would recommend follow-up with your PCP or with Podiatry within the next 2-3 days to ensure the toe is getting better. Sometimes there can be bleeding or swelling underneath the toenail that causes pain and requires treatment. ?

## 2021-09-21 NOTE — ED Provider Notes (Signed)
?[facility] ? ? ? ?CSN: [id number] ?Arrival date & time: 09/21/21  9798 ? ? ?  ? ?History   ?Chief Complaint ?Chief Complaint  ?Patient presents with  ? Toe Injury  ? Knee Pain  ? ? ?HPI ?Scott Glenn is a 51 y.o. male here for left great toe pain. ? ? ?Knee Pain ? ?About 8:00am this morning dropped a dresser - grazed his left knee and then onto his great toe.  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  He has full range of motion of the knee. ?He has not taken anything for the pain since this just happened. ? ?Past Medical History:  ?Diagnosis Date  ? Asthma   ? Chicken pox   ? Migraines   ? Seasonal allergies   ? ? ?Patient Active Problem List  ? Diagnosis Date Noted  ? Vitamin D deficiency 09/25/2020  ? Elevated blood pressure reading without diagnosis of hypertension 09/23/2020  ? Acute non intractable tension-type headache 09/23/2020  ? Psychophysiological insomnia 09/23/2020  ? Stress 09/23/2020  ? Fatigue 09/23/2020  ? ALLERGIC RHINITIS WITH CONJUNCTIVITIS 05/02/2008  ? ASTHMA 05/02/2008  ? GASTRITIS 03/03/2007  ? ? ?History reviewed. No pertinent surgical history. ? ? ? ? ?Home Medications   ? ?Prior to Admission medications   ?Medication Sig Start Date End Date Taking? Authorizing Provider  ?clotrimazole (LOTRIMIN) 1 % cream Apply 1 application topically 2 (two) times daily. Apply to groin ?Patient not taking: Reported on 06/01/2021 02/25/21   Gildardo Pounds, NP  ?triamcinolone cream (KENALOG) 0.1 % Apply 1 application topically 2 (two) times daily. ?Patient not taking: Reported on 08/17/2021 02/25/21   Gildardo Pounds, NP  ? ? ?Family History ?Family  History  ?Problem Relation Age of Onset  ? Hypertension Mother   ? Diabetes Mother   ? Stroke Father   ? Colon cancer Maternal Aunt   ? Colon polyps Neg Hx   ? Esophageal cancer Neg Hx   ? Stomach cancer Neg Hx   ? Rectal cancer Neg Hx   ? ? ?Social History ?Social History  ? ?Tobacco Use  ? Smoking status: Never  ? Smokeless tobacco: Never  ?Vaping Use  ? Vaping Use: Never used  ?Substance Use Topics  ? Alcohol use: Yes  ?  Comment: occasionally  ? Drug use: No  ? ? ? ?Allergies   ?Banana and Pork-derived products ? ? ?Review of Systems ?Review of Systems  ?Musculoskeletal:  Positive for gait problem.  ?     + left great toe pain  ?Skin:  Positive for wound.  ?Hematological:  Does not bruise/bleed easily.  ? ? ?Physical Exam ?Triage Vital Signs ?ED Triage Vitals  ?Enc Vitals Group  ?   BP 09/21/21 0829 (!) 133/97  ?   Pulse Rate 09/21/21 0829 (!) 58  ?   Resp 09/21/21 0829 16  ?   Temp 09/21/21 0829 98.7 ?F (37.1 ?C)  ?   Temp src --   ?   SpO2 09/21/21 0829 97 %  ?   Weight --   ?   Height --   ?  Head Circumference --   ?   Peak Flow --   ?   Pain Score 09/21/21 0827 8  ?   Pain Loc --   ?   Pain Edu? --   ?   Excl. in [place]? --   ? ?No data found. ? ?Updated Vital Signs ?BP (!) 133/97   Pulse (!) 58   Temp 98.7 ?F (37.1 ?C)   Resp 16   SpO2 97%  ? ?Physical Exam ?Constitutional:   ?   General: He is not in acute distress. ?   Appearance: Normal appearance. He is not toxic-appearing.  ?HENT:  ?   Head: Normocephalic and atraumatic.  ?Eyes:  ?   Extraocular Movements: Extraocular movements intact.  ?   Pupils: Pupils are equal, round, and reactive to light.  ?Cardiovascular:  ?   Rate and Rhythm: Normal rate.  ?Musculoskeletal:  ?   Comments: + there is a superficial linear split in the superficial aspect of the nail, approx 1.25cm. It does not appear to split through entire nail. There is small amount of blood underlying the nail. Able to bend the toe at IP and DIP. Cap refill < 2 secs ? ?+ Superficial skin  abrasion over the left anterior knee, no break in the skin.  No swelling or deformity.  There is full range of motion and full strength about the knee.  ?Skin: ?   General: Skin is warm.  ?   Capillary Refill: Capillary refill takes less than 2 seconds.  ?Neurological:  ?   General: No focal deficit present.  ?   Mental Status: He is alert.  ?Psychiatric:     ?   Mood and Affect: Mood normal.     ?   Thought Content: Thought content normal.  ? ? ? ? ?UC Treatments / Results  ?Labs ?(all labs ordered are listed, but only abnormal results are displayed) ?Labs Reviewed - No data to display ? ?EKG ? ? ?Radiology ?DG Foot Complete Left ? ?Result Date: 09/21/2021 ?CLINICAL DATA:  51 year old male status post blunt trauma when furniture fell on left lower extremity. EXAM: LEFT FOOT - COMPLETE 3+ VIEW COMPARISON:  None. FINDINGS: Bone mineralization is within normal limits. There is no evidence of fracture or dislocation. There is no evidence of arthropathy or other focal bone abnormality. No discrete soft tissue injury. IMPRESSION: Negative. Electronically Signed   By: Genevie Ann M.D.   On: 09/21/2021 08:48   ? ?Procedures ?Procedures (including critical care time) ? ?Medications Ordered in UC ?Medications - No data to display ? ?Initial Impression / Assessment and Plan / UC Course  ?I have reviewed the triage vital signs and the nursing notes. ? ?Pertinent labs & imaging results that were available during my care of the patient were reviewed by me and considered in my medical decision making (see chart for details). ? ?  ? ?Patient with left toe pain after dropping dresser across the dorsum of his great toe.  There is a small superficial linear laceration of the distal toenail, although does not go through the entire toenail.  He has full range of motion at the toe.  X-rays of the toe were negative for any evidence of underlying fracture.  We will provide him a note for work for the next 2 days and discussed supportive  treatment for this.  Would recommend he follows up with his PCP or podiatry over the next 2 or 3 days.  We did discuss that  sometimes there can be a hematoma that formed underneath the nail which is painful and would require aspiration for pain control.  He may return here if this does happen.  Strict return precaution provided, otherwise follow-up with PCP or podiatry as information provided. ? ?Final Clinical Impressions(s) / UC Diagnoses  ? ?Final diagnoses:  ?Pain of left great toe  ?Contusion of left great toe with damage to nail, initial encounter  ?Abrasion of left knee, initial encounter  ? ? ? ?Discharge Instructions   ? ?  ?Ice the toe, place in warm epsom salt bath ?May apply bacitracin or antibiotic ointment over top of toenail ? ?Would recommend follow-up with your PCP or with Podiatry within the next 2-3 days to ensure the toe is getting better. Sometimes there can be bleeding or swelling underneath the toenail that causes pain and requires treatment. ? ? ? ? ?ED Prescriptions   ?None ?  ? ?PDMP not reviewed this encounter. ?  Elba Barman, DO ?09/21/21 7471 ? ?

## 2021-10-19 IMAGING — CT CT HEAD W/O CM
3 series · 15 of 47 positions shown, 18 images · non-contrast
Comparison: 04/15/2010

CLINICAL DATA: Dizziness and headache.

EXAM:
CT HEAD WITHOUT CONTRAST
TECHNIQUE: Contiguous axial images were obtained from the base of the skull
through the vertex without intravenous contrast.

[Series 4: head 5.0 h30s · axial · 0.44mm/px · z∈[-68,+67]mm · 9 of 33 slices shown, 12 images]
[im 3/33  brain]
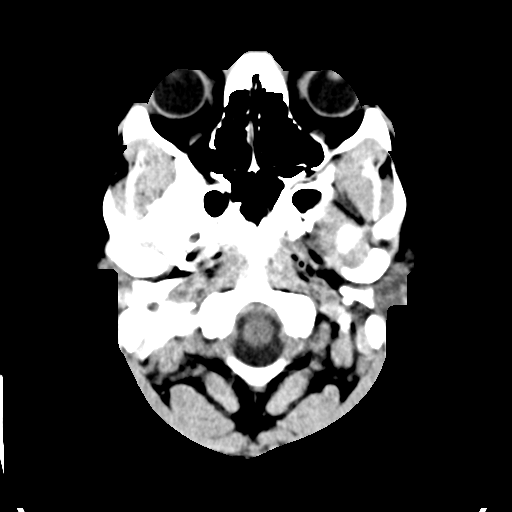
[im 3/33  bone]
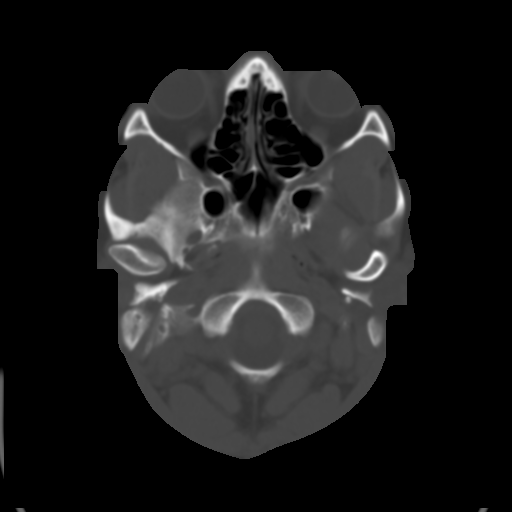
[im 6/33  brain]
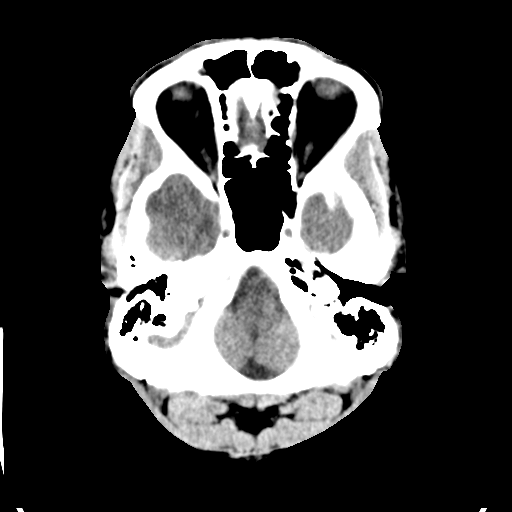
[im 9/33  brain]
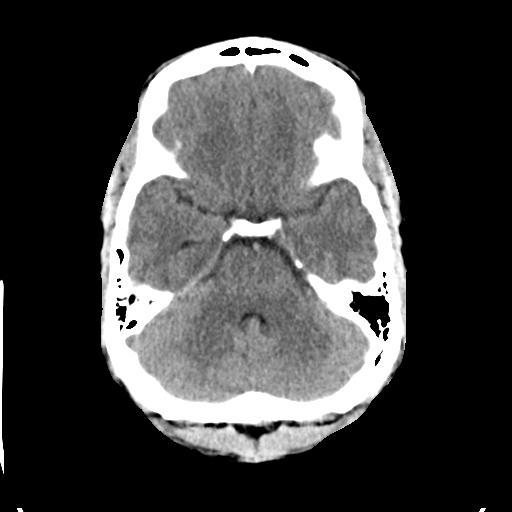
[im 13/33  brain]
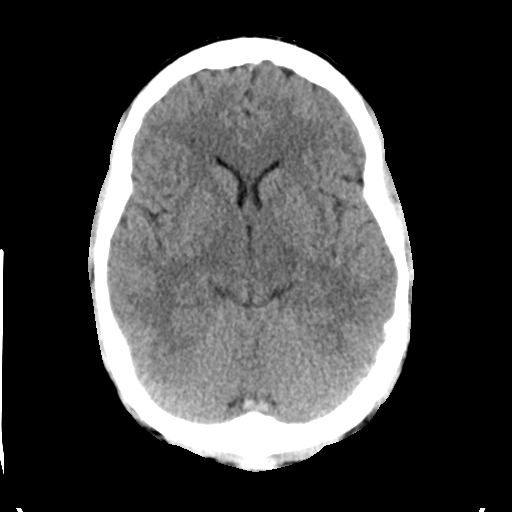
[im 17/33  brain]
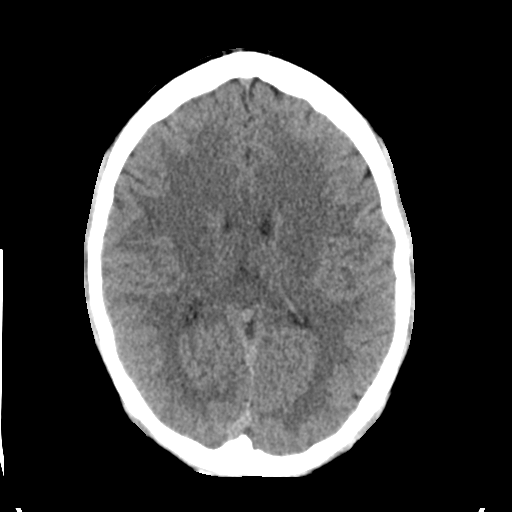
[im 17/33  bone]
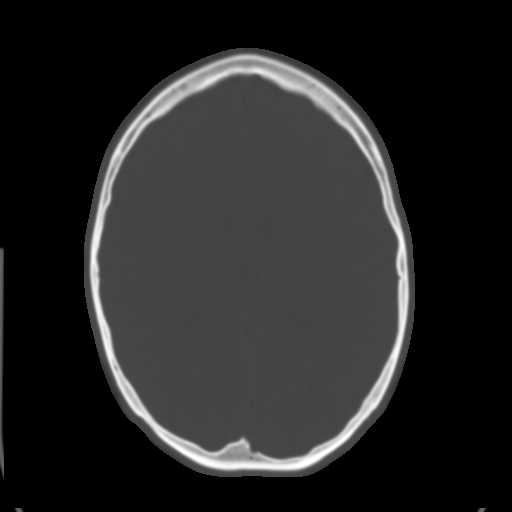
[im 20/33  brain]
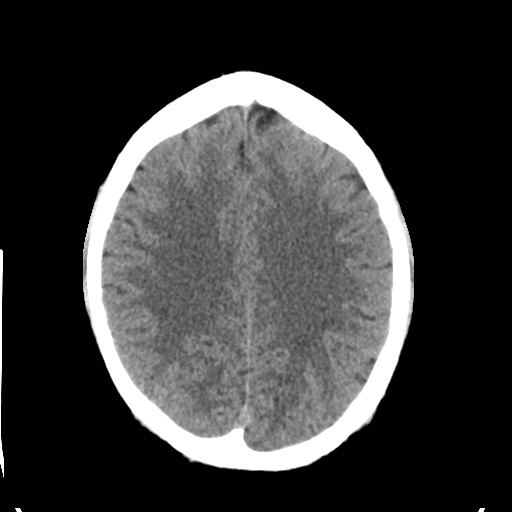
[im 24/33  brain]
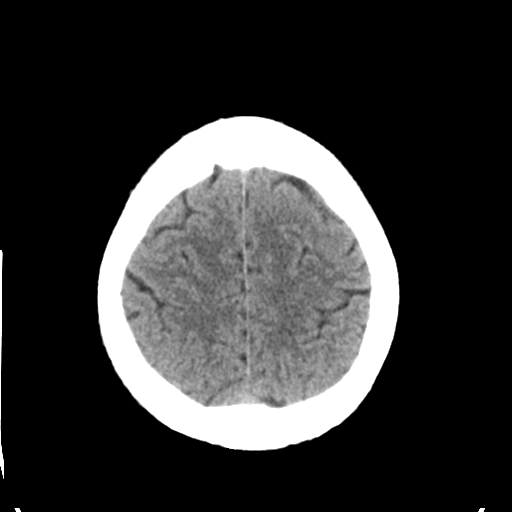
[im 27/33  brain]
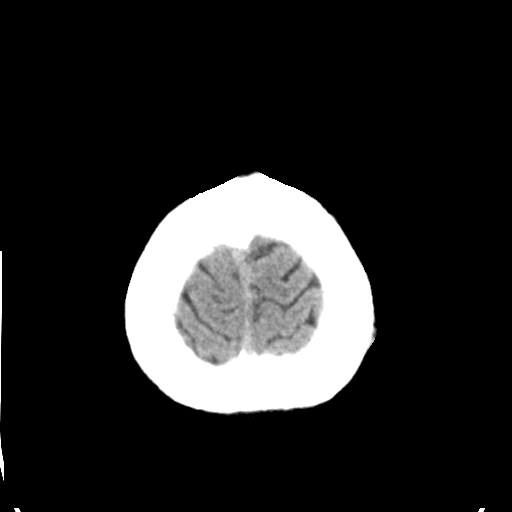
[im 30/33  brain]
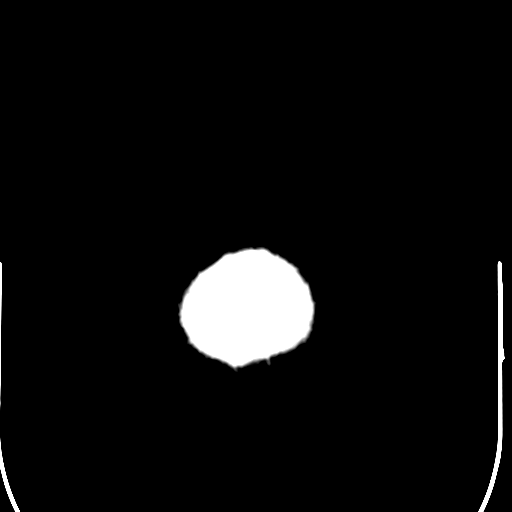
[im 30/33  bone]
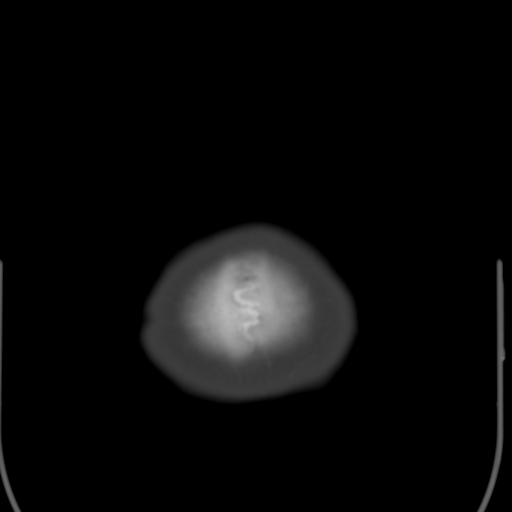

[Series 5: head 3.0 mpr cor · coronal · 0.33mm/px · 3 of 70 slices shown]
[im 24/70  brain]
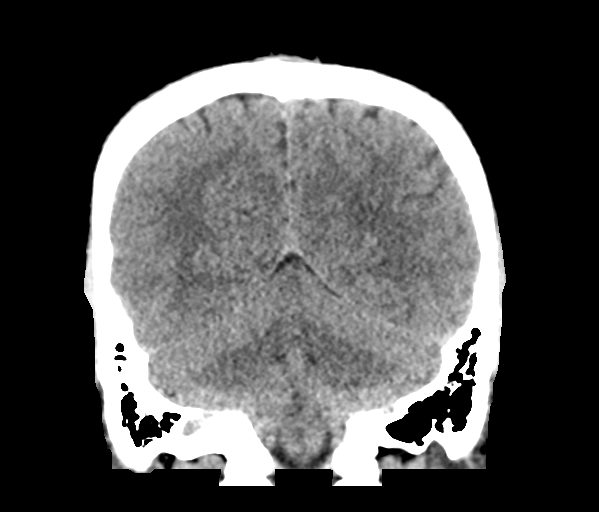
[im 31/70  brain]
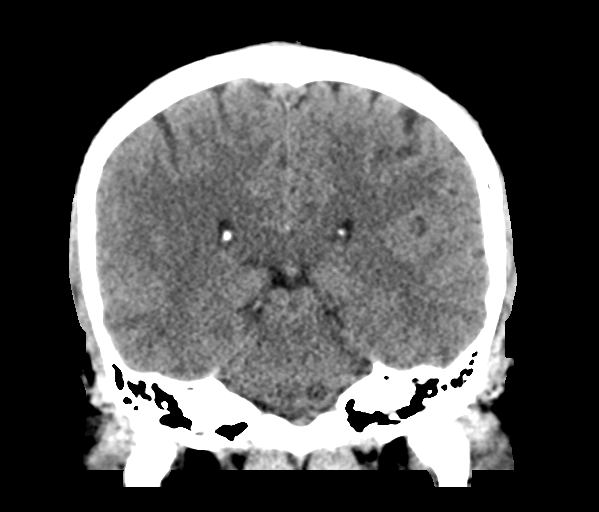
[im 39/70  brain]
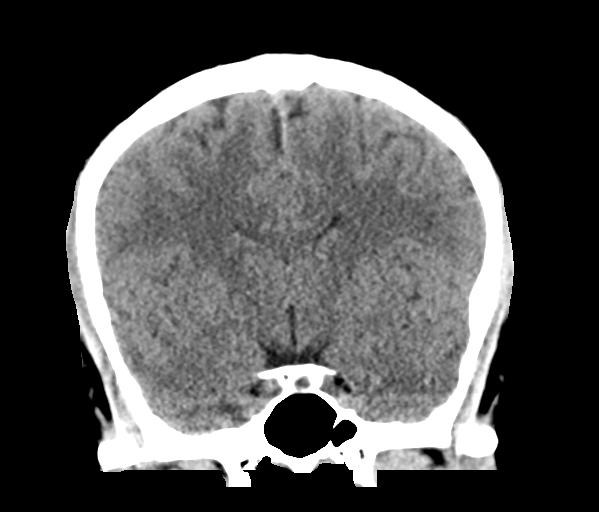

[Series 6: head 3.0 mpr sag · sagittal · 0.32mm/px · 3 of 56 slices shown]
[im 19/56  brain]
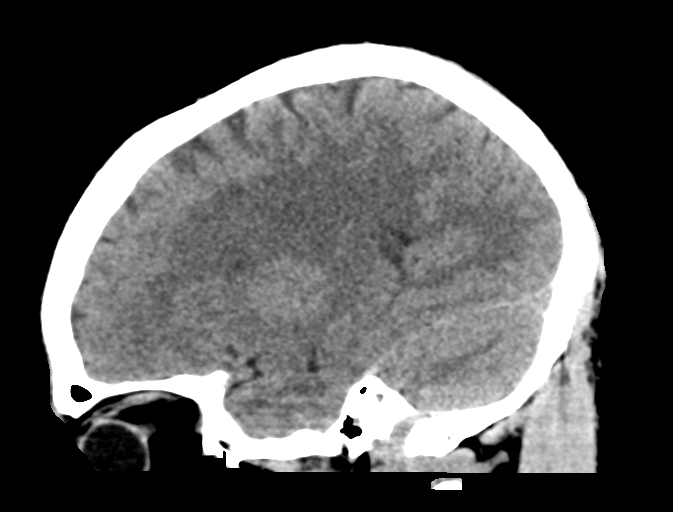
[im 28/56  brain]
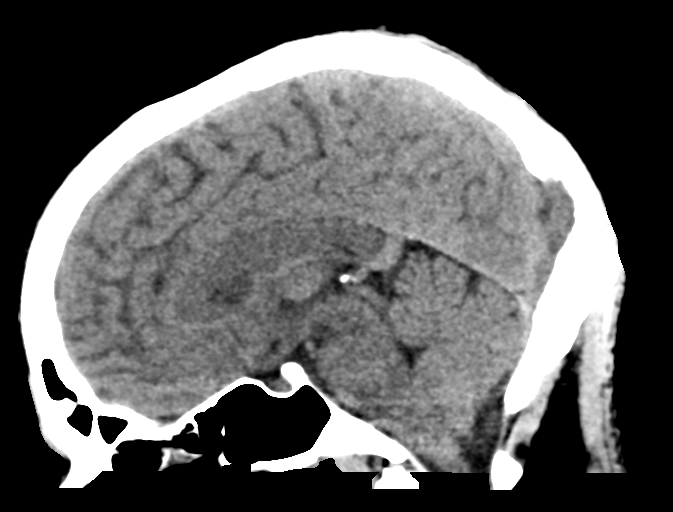
[im 37/56  brain]
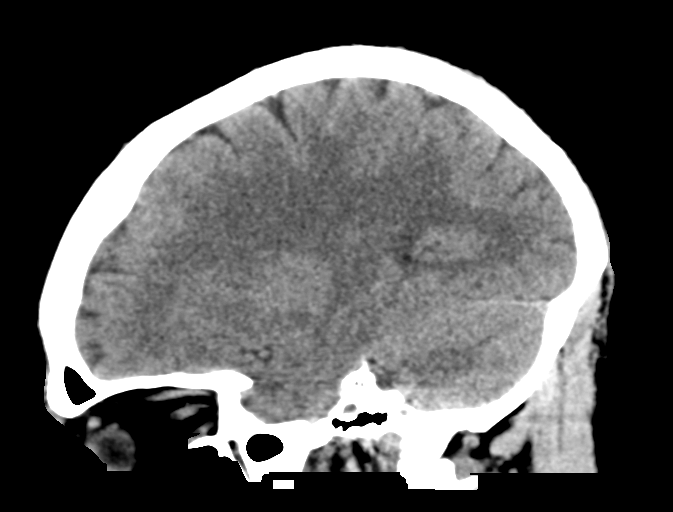

[15 of 47 positions shown; findings below may reference images not displayed]

FINDINGS: Brain: There is no evidence of an acute infarct, intracranial
hemorrhage, mass, midline shift, or extra-axial fluid collection.
The ventricles and sulci are normal. Scattered small hypodensities
in the cerebral white matter bilaterally are new from the prior
study.

Vascular: No hyperdense vessel.

Skull: No fracture suspicious osseous lesion.

Sinuses/Orbits: Visualized paranasal sinuses and mastoid air cells
are clear. Unremarkable orbits.

Other: None.
IMPRESSION: 1. No evidence of an acute infarct or intracranial hemorrhage.
2. Scattered small hypodensities in the cerebral white matter
bilaterally, new from 6644. These are nonspecific but may reflect
early chronic small vessel ischemia.

## 2021-10-22 ENCOUNTER — Telehealth: Payer: Self-pay | Admitting: Gastroenterology

## 2021-10-22 NOTE — Telephone Encounter (Signed)
Inbound call from patient stating that he would like to discuss with the nurse about blood in his stool. Requesting a call back. Please advise.  ?

## 2021-10-22 NOTE — Telephone Encounter (Signed)
Spoke with pt and he is aware of Dr. Dayle Points recommendations. He knows to go to the ER if lots of bleeding with dizziness or lightheaded. He will watch for now and may try otc prep h. ?

## 2021-10-22 NOTE — Telephone Encounter (Signed)
Pt states Tuesday he lifted something heavy and his back hurt Wednesday. This am around 2:30am his stomach hurt and he had a BM and there was BRB that colored the toilet bowl and brb on the tissue paper. Pt has not been back to the bathroom again but reports he was scared. Discussed with him when he picked up the heavy object he could have had a hemorrhoid that has started bleeding. Pt wants to know Dr. Candis Schatz recommends. Please advise. ?

## 2021-12-02 ENCOUNTER — Encounter: Payer: Self-pay | Admitting: Nurse Practitioner

## 2021-12-02 ENCOUNTER — Ambulatory Visit: Payer: Medicaid Other | Attending: Nurse Practitioner | Admitting: Nurse Practitioner

## 2021-12-02 VITALS — BP 128/84 | HR 72 | Ht 69.0 in | Wt 166.6 lb

## 2021-12-02 DIAGNOSIS — R7309 Other abnormal glucose: Secondary | ICD-10-CM | POA: Diagnosis not present

## 2021-12-02 DIAGNOSIS — Z125 Encounter for screening for malignant neoplasm of prostate: Secondary | ICD-10-CM

## 2021-12-02 DIAGNOSIS — Z862 Personal history of diseases of the blood and blood-forming organs and certain disorders involving the immune mechanism: Secondary | ICD-10-CM

## 2021-12-02 DIAGNOSIS — E559 Vitamin D deficiency, unspecified: Secondary | ICD-10-CM

## 2021-12-02 DIAGNOSIS — E7800 Pure hypercholesterolemia, unspecified: Secondary | ICD-10-CM

## 2021-12-02 NOTE — Progress Notes (Signed)
Assessment & Plan:  Scott Glenn was seen today for anemia.  Diagnoses and all orders for this visit:  History of anemia -     CBC  Elevated glucose -     CMP14+EGFR  Vitamin D deficiency disease -     VITAMIN D 25 Hydroxy (Vit-D Deficiency, Fractures)  Prostate cancer screening -     PSA  Pure hypercholesterolemia -     Lipid panel    Patient has been counseled on age-appropriate routine health concerns for screening and prevention. These are reviewed and up-to-date. Referrals have been placed accordingly. Immunizations are up-to-date or declined.    Subjective:   Chief Complaint  Patient presents with   Anemia   HPI Scott Glenn 51 y.o. male presents to office today for follow up. He is doing well today. Has no questions or concerns. S/P colonoscopy 08-31-2021. Was initially having left sided pain after his colonoscopy however today he states the pain has completely resolved.         Review of Systems  Constitutional:  Negative for fever, malaise/fatigue and weight loss.  HENT: Negative.  Negative for nosebleeds.   Eyes: Negative.  Negative for blurred vision, double vision and photophobia.  Respiratory: Negative.  Negative for cough and shortness of breath.   Cardiovascular: Negative.  Negative for chest pain, palpitations and leg swelling.  Gastrointestinal: Negative.  Negative for heartburn, nausea and vomiting.  Musculoskeletal: Negative.  Negative for myalgias.  Neurological: Negative.  Negative for dizziness, focal weakness, seizures and headaches.  Psychiatric/Behavioral: Negative.  Negative for suicidal ideas.    Past Medical History:  Diagnosis Date   Asthma    Chicken pox    Migraines    Seasonal allergies     History reviewed. No pertinent surgical history.  Family History  Problem Relation Age of Onset   Hypertension Mother    Diabetes Mother    Stroke Father    Colon cancer Maternal Aunt    Colon polyps Neg Hx    Esophageal cancer Neg  Hx    Stomach cancer Neg Hx    Rectal cancer Neg Hx     Social History Reviewed with no changes to be made today.   Outpatient Medications Prior to Visit  Medication Sig Dispense Refill   clotrimazole (LOTRIMIN) 1 % cream Apply 1 application topically 2 (two) times daily. Apply to groin (Patient not taking: Reported on 06/01/2021) 60 g 1   triamcinolone cream (KENALOG) 0.1 % Apply 1 application topically 2 (two) times daily. (Patient not taking: Reported on 08/17/2021) 60 g 1   No facility-administered medications prior to visit.    Allergies  Allergen Reactions   Banana     Itching, dyspnea and nausea   Pork-Derived Products     Headache, diarrhea and vomiting       Objective:    BP 128/84   Pulse 72   Ht $R'5\' 9"'tX$  (1.753 m)   Wt 166 lb 9.6 oz (75.6 kg)   SpO2 99%   BMI 24.60 kg/m  Wt Readings from Last 3 Encounters:  12/02/21 166 lb 9.6 oz (75.6 kg)  08/31/21 180 lb (81.6 kg)  08/17/21 180 lb (81.6 kg)    Physical Exam Vitals and nursing note reviewed.  Constitutional:      Appearance: He is well-developed.  HENT:     Head: Normocephalic and atraumatic.  Cardiovascular:     Rate and Rhythm: Normal rate and regular rhythm.     Heart sounds: Normal  heart sounds. No murmur heard.   No friction rub. No gallop.  Pulmonary:     Effort: Pulmonary effort is normal. No tachypnea or respiratory distress.     Breath sounds: Normal breath sounds. No decreased breath sounds, wheezing, rhonchi or rales.  Chest:     Chest wall: No tenderness.  Abdominal:     General: Bowel sounds are normal.     Palpations: Abdomen is soft.  Musculoskeletal:        General: Normal range of motion.     Cervical back: Normal range of motion.  Skin:    General: Skin is warm and dry.  Neurological:     Mental Status: He is alert and oriented to person, place, and time.     Coordination: Coordination normal.  Psychiatric:        Behavior: Behavior normal. Behavior is cooperative.         Thought Content: Thought content normal.        Judgment: Judgment normal.         Patient has been counseled extensively about nutrition and exercise as well as the importance of adherence with medications and regular follow-up. The patient was given clear instructions to go to ER or return to medical center if symptoms don't improve, worsen or new problems develop. The patient verbalized understanding.   Follow-up: Return in about 6 months (around 06/03/2022) for Physical .   Gildardo Pounds, FNP-BC [facility] [place], [place]   12/02/2021, 10:15 PM

## 2021-12-02 NOTE — Progress Notes (Signed)
Having abdominal pain after having colonoscopy.

## 2021-12-03 LAB — CMP14+EGFR
ALT: 24 IU/L (ref 0–44)
AST: 40 IU/L (ref 0–40)
Albumin/Globulin Ratio: 2.2 (ref 1.2–2.2)
Albumin: 4.9 g/dL (ref 4.0–5.0)
Alkaline Phosphatase: 65 IU/L (ref 44–121)
BUN/Creatinine Ratio: 13 (ref 9–20)
BUN: 16 mg/dL (ref 6–24)
Bilirubin Total: 2.4 mg/dL — ABNORMAL HIGH (ref 0.0–1.2)
CO2: 23 mmol/L (ref 20–29)
Calcium: 9.4 mg/dL (ref 8.7–10.2)
Chloride: 103 mmol/L (ref 96–106)
Creatinine, Ser: 1.21 mg/dL (ref 0.76–1.27)
Globulin, Total: 2.2 g/dL (ref 1.5–4.5)
Glucose: 82 mg/dL (ref 70–99)
Potassium: 4.1 mmol/L (ref 3.5–5.2)
Sodium: 142 mmol/L (ref 134–144)
Total Protein: 7.1 g/dL (ref 6.0–8.5)
eGFR: 73 mL/min/{1.73_m2} (ref >59)

## 2021-12-03 LAB — CBC
Hematocrit: 42.2 % (ref 37.5–51.0)
Hemoglobin: 14.2 g/dL (ref 13.0–17.7)
MCH: 30.7 pg (ref 26.6–33.0)
MCHC: 33.6 g/dL (ref 31.5–35.7)
MCV: 91 fL (ref 79–97)
Platelets: 214 10*3/uL (ref 150–450)
RBC: 4.62 x10E6/uL (ref 4.14–5.80)
RDW: 11.8 % (ref 11.6–15.4)
WBC: 5.1 10*3/uL (ref 3.4–10.8)

## 2021-12-03 LAB — VITAMIN D 25 HYDROXY (VIT D DEFICIENCY, FRACTURES): Vit D, 25-Hydroxy: 23.8 ng/mL — ABNORMAL LOW (ref 30.0–100.0)

## 2021-12-03 LAB — LIPID PANEL
Chol/HDL Ratio: 2.4 ratio (ref 0.0–5.0)
Cholesterol, Total: 169 mg/dL (ref 100–199)
HDL: 70 mg/dL (ref >39)
LDL Chol Calc (NIH): 75 mg/dL (ref 0–99)
Triglycerides: 142 mg/dL (ref 0–149)
VLDL Cholesterol Cal: 24 mg/dL (ref 5–40)

## 2021-12-03 LAB — PSA: Prostate Specific Ag, Serum: 0.9 ng/mL (ref 0.0–4.0)

## 2021-12-08 ENCOUNTER — Other Ambulatory Visit: Payer: Self-pay | Admitting: Nurse Practitioner

## 2021-12-08 DIAGNOSIS — R17 Unspecified jaundice: Secondary | ICD-10-CM

## 2022-01-28 ENCOUNTER — Other Ambulatory Visit: Payer: Self-pay

## 2022-01-28 ENCOUNTER — Ambulatory Visit (HOSPITAL_COMMUNITY)
Admission: EM | Admit: 2022-01-28 | Discharge: 2022-01-28 | Disposition: A | Discharge status: Home / Self Care | Payer: Medicaid Other | Attending: Family Medicine | Admitting: Family Medicine

## 2022-01-28 ENCOUNTER — Encounter (HOSPITAL_COMMUNITY): Payer: Self-pay | Admitting: Emergency Medicine

## 2022-01-28 DIAGNOSIS — H5712 Ocular pain, left eye: Secondary | ICD-10-CM | POA: Diagnosis not present

## 2022-01-28 MED ORDER — EYE WASH OP SOLN
OPHTHALMIC | Status: AC
  Filled 2022-01-28: qty 118

## 2022-01-28 MED ORDER — FLUORESCEIN SODIUM 1 MG OP STRP
ORAL_STRIP | OPHTHALMIC | Status: AC
  Filled 2022-01-28: qty 1

## 2022-01-28 MED ORDER — TETRACAINE HCL 0.5 % OP SOLN
OPHTHALMIC | Status: AC
  Filled 2022-01-28: qty 4

## 2022-01-28 MED ORDER — GENTAMICIN SULFATE 0.3 % OP SOLN: 2.0000 [drp] | OPHTHALMIC | 0 refills | Status: AC

## 2022-01-28 NOTE — Discharge Instructions (Addendum)
Use the drops every 4 hours in the left eye.  Apply warm compress to the eyelid multiple times daily.  Please go to the emergency department if symptoms worsen.

## 2022-01-28 NOTE — ED Provider Notes (Signed)
[facility]    CSN: [id number] Arrival date & time: 01/28/22  0805     History   Chief Complaint Chief Complaint  Patient presents with   Eye Problem    HPI Scott Glenn is a 51 y.o. male.  Presents with 1 day history of left eye pain.  Reports he woke up this morning rubbing his eye and felt pain in the eyelid.  Noticed eye was red and itchy.  Had some watery drainage and was slightly swollen this morning. Denies any foreign body but he does work in a [employer]. Denies any vision changes or loss of vision.  Does not wear glasses or contacts. No fevers, pain with eye movement, mucousy drainage.  Past Medical History:  Diagnosis Date   Asthma    Chicken pox    Migraines    Seasonal allergies     Patient Active Problem List   Diagnosis Date Noted   Vitamin D deficiency 09/25/2020   Elevated blood pressure reading without diagnosis of hypertension 09/23/2020   Acute non intractable tension-type headache 09/23/2020   Psychophysiological insomnia 09/23/2020   Stress 09/23/2020   Fatigue 09/23/2020   ALLERGIC RHINITIS WITH CONJUNCTIVITIS 05/02/2008   ASTHMA 05/02/2008   GASTRITIS 03/03/2007    History reviewed. No pertinent surgical history.     Home Medications    Prior to Admission medications   Medication Sig Start Date End Date Taking? Authorizing Provider  gentamicin (GARAMYCIN) 0.3 % ophthalmic solution Place 2 drops into the left eye every 4 (four) hours for 5 days. 01/28/22 02/02/22 Yes Charlie Seda, Wells Guiles, PA-C    Family History Family History  Problem Relation Age of Onset   Hypertension Mother    Diabetes Mother    Stroke Father    Colon cancer Maternal Aunt    Colon polyps Neg Hx    Esophageal cancer Neg Hx    Stomach cancer Neg Hx    Rectal cancer Neg Hx     Social History Social History   Tobacco Use   Smoking status: Never   Smokeless tobacco: Never  Vaping Use   Vaping Use: Never used  Substance Use Topics   Alcohol use:  Yes    Comment: occasionally   Drug use: Yes    Types: Marijuana     Allergies   Banana and Pork-derived products   Review of Systems Review of Systems   Physical Exam Triage Vital Signs ED Triage Vitals  Enc Vitals Group     BP 01/28/22 0827 131/87     Pulse Rate 01/28/22 0827 66     Resp 01/28/22 0827 18     Temp 01/28/22 0827 98.3 F (36.8 C)     Temp Source 01/28/22 0827 Oral     SpO2 01/28/22 0827 98 %     Weight --      Height --      Head Circumference --      Peak Flow --      Pain Score 01/28/22 0825 7     Pain Loc --      Pain Edu? --      Excl. in [place]? --    No data found.  Updated Vital Signs BP 131/87 (BP Location: Right Arm)   Pulse 66   Temp 98.3 F (36.8 C) (Oral)   Resp 18   SpO2 98%   Visual Acuity Right Eye Distance: 20/20 Left Eye Distance: 20/20 Bilateral Distance: 20/20  Right Eye Near:   Left  Eye Near:    Bilateral Near:     Physical Exam Vitals and nursing note reviewed.  Constitutional:      Appearance: Normal appearance.  HENT:     Head: Normocephalic and atraumatic.     Right Ear: Tympanic membrane and ear canal normal.     Left Ear: Tympanic membrane and ear canal normal.     Nose: No congestion.     Mouth/Throat:     Mouth: Mucous membranes are moist.     Pharynx: Oropharynx is clear. No oropharyngeal exudate or posterior oropharyngeal erythema.  Eyes:     General: Lids are normal. Lids are everted, no foreign bodies appreciated. Vision grossly intact. Gaze aligned appropriately.        Right eye: No discharge.        Left eye: No foreign body or discharge.     Extraocular Movements: Extraocular movements intact.     Conjunctiva/sclera:     Left eye: Left conjunctiva is injected. No chemosis.    Pupils: Pupils are equal, round, and reactive to light.     Comments: Left conjunctiva mildly injected, very mild swelling to the left upper lid with tenderness to palpation  Cardiovascular:     Rate and Rhythm: Normal  rate and regular rhythm.     Pulses: Normal pulses.     Heart sounds: Normal heart sounds.  Pulmonary:     Effort: Pulmonary effort is normal.     Breath sounds: Normal breath sounds.  Musculoskeletal:     Cervical back: Normal range of motion.  Lymphadenopathy:     Cervical: No cervical adenopathy.  Neurological:     Mental Status: He is alert and oriented to person, place, and time.     UC Treatments / Results  Labs (all labs ordered are listed, but only abnormal results are displayed) Labs Reviewed - No data to display  EKG  Radiology No results found.  Procedures Procedures   Medications Ordered in UC Medications - No data to display  Initial Impression / Assessment and Plan / UC Course  I have reviewed the triage vital signs and the nursing notes.  Pertinent labs & imaging results that were available during my care of the patient were reviewed by me and considered in my medical decision making (see chart for details).  No foreign body noted on exam, no ulceration or abrasion of cornea with fluorescein/woods lamp examination.  Likely stye versus conjunctivitis or combination of the two.  Recommend gentamicin drops with warm compress.  Return precautions discussed. Patient agrees with plan, he is discharged stable condition.  Final Clinical Impressions(s) / UC Diagnoses   Final diagnoses:  Left eye pain     Discharge Instructions      Use the drops every 4 hours in the left eye.  Apply warm compress to the eyelid multiple times daily.  Please go to the emergency department if symptoms worsen.    ED Prescriptions     Medication Sig Dispense Auth. Provider   gentamicin (GARAMYCIN) 0.3 % ophthalmic solution Place 2 drops into the left eye every 4 (four) hours for 5 days. 3 mL Abrham Maslowski, Wells Guiles, PA-C      PDMP not reviewed this encounter.   Les Pou, [place] 01/28/22 4782

## 2022-01-28 NOTE — ED Triage Notes (Signed)
Left eye pain, no known injury.  Reports eye is watery, difficult to open eye this morning.  Patient works in a [employer].  Reports if he looks to the right, he perceives a blur to the left of vision field.  Patient does not wear glasses or contacts

## 2022-03-30 ENCOUNTER — Ambulatory Visit (HOSPITAL_COMMUNITY)
Admission: EM | Admit: 2022-03-30 | Discharge: 2022-03-30 | Disposition: A | Discharge status: Home / Self Care | Payer: Medicaid Other | Attending: Internal Medicine | Admitting: Internal Medicine

## 2022-03-30 ENCOUNTER — Emergency Department (HOSPITAL_BASED_OUTPATIENT_CLINIC_OR_DEPARTMENT_OTHER): Payer: Medicaid Other

## 2022-03-30 ENCOUNTER — Encounter (HOSPITAL_COMMUNITY): Payer: Self-pay | Admitting: Emergency Medicine

## 2022-03-30 ENCOUNTER — Encounter (HOSPITAL_BASED_OUTPATIENT_CLINIC_OR_DEPARTMENT_OTHER): Payer: Self-pay

## 2022-03-30 ENCOUNTER — Other Ambulatory Visit: Payer: Self-pay

## 2022-03-30 ENCOUNTER — Emergency Department (HOSPITAL_BASED_OUTPATIENT_CLINIC_OR_DEPARTMENT_OTHER)
Admission: EM | Admit: 2022-03-30 | Discharge: 2022-03-30 | Disposition: A | Discharge status: Home / Self Care | Payer: Medicaid Other | Attending: Emergency Medicine | Admitting: Emergency Medicine

## 2022-03-30 DIAGNOSIS — R55 Syncope and collapse: Secondary | ICD-10-CM | POA: Insufficient documentation

## 2022-03-30 DIAGNOSIS — R8289 Other abnormal findings on cytological and histological examination of urine: Secondary | ICD-10-CM | POA: Insufficient documentation

## 2022-03-30 DIAGNOSIS — R519 Headache, unspecified: Secondary | ICD-10-CM | POA: Insufficient documentation

## 2022-03-30 DIAGNOSIS — E875 Hyperkalemia: Secondary | ICD-10-CM | POA: Diagnosis not present

## 2022-03-30 DIAGNOSIS — J45909 Unspecified asthma, uncomplicated: Secondary | ICD-10-CM | POA: Diagnosis not present

## 2022-03-30 DIAGNOSIS — R42 Dizziness and giddiness: Secondary | ICD-10-CM | POA: Diagnosis not present

## 2022-03-30 LAB — BASIC METABOLIC PANEL
Anion gap: 12 (ref 5–15)
BUN: 12 mg/dL (ref 6–20)
CO2: 26 mmol/L (ref 22–32)
Calcium: 9.5 mg/dL (ref 8.9–10.3)
Chloride: 104 mmol/L (ref 98–111)
Creatinine, Ser: 1.04 mg/dL (ref 0.61–1.24)
GFR, Estimated: >60 mL/min (ref >60)
Glucose, Bld: 98 mg/dL (ref 70–99)
Potassium: 4.5 mmol/L (ref 3.5–5.1)
Sodium: 142 mmol/L (ref 135–145)

## 2022-03-30 LAB — URINALYSIS, ROUTINE W REFLEX MICROSCOPIC
Bilirubin Urine: NEGATIVE
Glucose, UA: NEGATIVE mg/dL
Hgb urine dipstick: NEGATIVE
Ketones, ur: NEGATIVE mg/dL
Leukocytes,Ua: NEGATIVE
Nitrite: NEGATIVE
Specific Gravity, Urine: 1.023 (ref 1.005–1.030)
pH: 7 (ref 5.0–8.0)

## 2022-03-30 LAB — CBC
HCT: 42.1 % (ref 39.0–52.0)
Hemoglobin: 14.6 g/dL (ref 13.0–17.0)
MCH: 31.7 pg (ref 26.0–34.0)
MCHC: 34.7 g/dL (ref 30.0–36.0)
MCV: 91.5 fL (ref 80.0–100.0)
Platelets: 248 10*3/uL (ref 150–400)
RBC: 4.6 MIL/uL (ref 4.22–5.81)
RDW: 12 % (ref 11.5–15.5)
WBC: 7.5 10*3/uL (ref 4.0–10.5)
nRBC: 0 % (ref 0.0–0.2)

## 2022-03-30 LAB — CK: Total CK: 557 U/L — ABNORMAL HIGH (ref 49–397)

## 2022-03-30 LAB — CBG MONITORING, ED
Glucose-Capillary: 107 mg/dL — ABNORMAL HIGH (ref 70–99)
Glucose-Capillary: 99 mg/dL (ref 70–99)

## 2022-03-30 LAB — TROPONIN I (HIGH SENSITIVITY): Troponin I (High Sensitivity): <2 ng/L (ref <18)

## 2022-03-30 MED ORDER — LACTATED RINGERS IV BOLUS
1000.0000 mL | Freq: Once | INTRAVENOUS | Status: AC
  Administered 2022-03-30: 1000 mL via INTRAVENOUS

## 2022-03-30 NOTE — Discharge Instructions (Addendum)
You were seen in the emergency department for evaluation after a syncopal episode.  Your labs and imaging were unremarkable.  We have rehydrated you with some IV fluids.  Please continue to drink plenty of water when you go home.  I given you a day off of work.  Please make sure to call your primary care doctor and be seen within the next week for reevaluation.  If you notice any bruising to your eyes or head, worsening headache, visual changes, weakness on one side of your body, chest pain, shortness of breath, Donnell pain, nausea, vomiting, please return to the nearest emergency department for reevaluation.  Please see below for more return precautions.  You can take ibuprofen or Tylenol as needed for pain.  Contact a doctor if: You have episodes of near fainting. Get help right away if: You pass out or faint. You hit your head or are injured after fainting. You have any of these symptoms: Fast or uneven heartbeats (palpitations). Pain in your chest, belly, or back. Shortness of breath. You have jerky movements that you cannot control (seizure). You have a very bad headache. You are confused. You have problems with how you see (vision). You are very weak. You have trouble walking. You are bleeding from your mouth or your butt (rectum). You have black or tarry poop (stool). These symptoms may be an emergency. Get help right away. Call your local emergency services (911 in the [place] Do not wait to see if the symptoms will go away. Do not drive yourself to the hospital.

## 2022-03-30 NOTE — ED Provider Notes (Addendum)
Patient presents to urgent care for evaluation after he became sweaty, dizzy, nauseous, and passed out this morning while at work.  He states that the event was not witnessed by coworkers although the event was caught on camera.  He is unsure if he hit his head or not but is complaining of pain to the left temporal/parietal aspect of his head.  States that he is slightly lightheaded and dizzy right now.  He ate a decent breakfast this morning and is a prediabetic.  This is happened in the past but he states that this was because of his "asthma attack" and EMS was called to his home but he never went to the hospital to be checked out.  No preceding chest pain or shortness of breath.  Currently denying chest pain, shortness of breath, one-sided weakness, blurry vision, difficulty with speech, and history of cerebrovascular event.  Head pain is currently a 4 on a scale of 0-10.  He does not take any blood thinning medications.  He did not become nauseous or vomit after syncopal episode.  Denies history of heart problems.  He is reporting a small amount of left arm pain but believes that this is due to the mechanism of his injury with the fall. Patient does admit that he drank about 3-4 beers last night while watching football and also took 1 shot but states that this is not a regular occurrence for him and he would not identify himself as "an alcoholic".  CBG 107 in clinic.  No focal neurologic deficit to physical exam in triage with 5/5 strength throughout.  EKG shows normal sinus rhythm without ST changes and a ventricular rate of 70 bpm.  Recommend patient go to the nearest emergency department for evaluation and urgent blood work.  He did not drive himself to urgent care and was driven by his mother.  Vital signs are hemodynamically stable at this time and patient is safe to go to the nearest emergency department by personal vehicle.  Discussed risks of deferring emergency department visit to which patient  verbalizes understanding and agreement.  Patient discharged from urgent care with plan to go to the ED in stable condition.     Talbot Grumbling, [place] 03/30/22 1118

## 2022-03-30 NOTE — Discharge Instructions (Addendum)
Please go to the nearest emergency department for further evaluation since you became dizzy, sweaty, lost consciousness, and hit your head on the floor. You need urgent blood work to rule out electrolyte abnormality.

## 2022-03-30 NOTE — ED Triage Notes (Signed)
Pt reports was at work and got dizzy and past out. Believes hit his head. Reports past out about 4 months ago but was only seen by EMS when they came out but not by provider.  Reports did eat this morning.  Pt c/o head and left arm pain, believes from when past out and fell.

## 2022-03-30 NOTE — ED Provider Notes (Signed)
[facility] DEPT Provider Note   CSN: [id number] Arrival date & time: 03/30/22  1123     History  Chief Complaint  Patient presents with   Loss of Consciousness    Scott Glenn is a 51 y.o. male with history of asthma presents the emergency department for evaluation of syncopal episode today at work.  Patient reports that he was at work today when he started to become diaphoretic and lightheaded.  He reports that he went to walk and passed out falling backwards.  He reports that he saw it on the cameras at work afterwards and reports he was probably down for less than a minute.  He denies any chest pain, shortness of breath, or any palpitations.  He reports some headache now but otherwise feels fine.  He reports the last night that he was drinking alcoholic beverages and had around 4 beers and 1 shot of liquor which is not typical for him.  He works in a [employer] and he lifts objects all day.  Denies any allergies to any medications.  Denies any tobacco, EtOH, illicit drug use ever.   Loss of Consciousness Associated symptoms: diaphoresis   Associated symptoms: no chest pain, no fever, no nausea, no palpitations, no shortness of breath and no vomiting        Home Medications Prior to Admission medications   Not on File      Allergies    Banana and Pork-derived products    Review of Systems   Review of Systems  Constitutional:  Positive for diaphoresis. Negative for chills and fever.  HENT:  Negative for congestion and rhinorrhea.   Respiratory:  Negative for shortness of breath.   Cardiovascular:  Positive for syncope. Negative for chest pain and palpitations.  Gastrointestinal:  Negative for abdominal pain, nausea and vomiting.  Genitourinary:  Negative for dysuria and hematuria.  Musculoskeletal:  Positive for neck pain. Negative for back pain.  Neurological:  Positive for syncope and light-headedness.    Physical Exam Updated  Vital Signs BP (!) 137/94   Pulse 68   Temp 97.9 F (36.6 C)   Resp 11   Ht '5\' 9"'$  (1.753 m)   Wt 75.6 kg   SpO2 100%   BMI 24.61 kg/m  Physical Exam Vitals and nursing note reviewed.  Constitutional:      General: He is not in acute distress.    Appearance: Normal appearance. He is not ill-appearing or toxic-appearing.  HENT:     Head: Normocephalic.     Comments: Patient has an approximately quarter size area to his posterior parietal scalp mainly to the right that is slightly tender to touch.  No other step-offs or deformities noted.  No battle signs or raccoon eyes.    Right Ear: Tympanic membrane, ear canal and external ear normal.     Left Ear: Tympanic membrane, ear canal and external ear normal.  Eyes:     General: No scleral icterus.    Extraocular Movements: Extraocular movements intact.     Pupils: Pupils are equal, round, and reactive to light.  Neck:     Comments: Diffuse neck tenderness, patient full range of motion with mild pain.  No step-offs or deformities.  No signs of trauma. Cardiovascular:     Rate and Rhythm: Normal rate and regular rhythm.  Pulmonary:     Effort: Pulmonary effort is normal.     Breath sounds: Normal breath sounds.  Abdominal:     General:  Bowel sounds are normal. There is no distension.     Palpations: Abdomen is soft.     Tenderness: There is no abdominal tenderness. There is no guarding or rebound.  Musculoskeletal:        General: No deformity.     Cervical back: Normal range of motion. Tenderness present.  Lymphadenopathy:     Cervical: No cervical adenopathy.  Skin:    General: Skin is warm and dry.  Neurological:     General: No focal deficit present.     Mental Status: He is alert. Mental status is at baseline.     GCS: GCS eye subscore is 4. GCS verbal subscore is 5. GCS motor subscore is 6.     Cranial Nerves: No cranial nerve deficit, dysarthria or facial asymmetry.     Sensory: No sensory deficit.     Motor: No  weakness or pronator drift.     Gait: Gait normal.     Comments: GCS 15.  No cranial nerve deficit.  Patient answering questions appropriately with appropriate speech.  No facial droop noted.  Sensation intact throughout.  Strength is symmetric in patient's upper and lower bilateral extremities.  No pronator drift.  Normal normal gait.     ED Results / Procedures / Treatments   Labs (all labs ordered are listed, but only abnormal results are displayed) Labs Reviewed  URINALYSIS, ROUTINE W REFLEX MICROSCOPIC - Abnormal; Notable for the following components:      Result Value   Protein, ur TRACE (*)    All other components within normal limits  CK - Abnormal; Notable for the following components:   Total CK 557 (*)    All other components within normal limits  BASIC METABOLIC PANEL  CBC  CBG MONITORING, ED  TROPONIN I (HIGH SENSITIVITY)    EKG EKG Interpretation  Date/Time:  Tuesday March 30 2022 14:42:18 EDT Ventricular Rate:  62 PR Interval:  175 QRS Duration: 99 QT Interval:  409 QTC Calculation: 416 R Axis:   12 Text Interpretation: Normal sinus rhythm Normal ECG Confirmed by Pattricia Boss [phone]) on 03/30/2022 2:53:36 PM  Radiology CT Head Wo Contrast  Result Date: 03/30/2022 CLINICAL DATA:  Head trauma, moderate-severe; Neck trauma, dangerous injury mechanism (Age 55-64y) EXAM: CT HEAD WITHOUT CONTRAST CT CERVICAL SPINE WITHOUT CONTRAST TECHNIQUE: Multidetector CT imaging of the head and cervical spine was performed following the standard protocol without intravenous contrast. Multiplanar CT image reconstructions of the cervical spine were also generated. RADIATION DOSE REDUCTION: This exam was performed according to the departmental dose-optimization program which includes automated exposure control, adjustment of the mA and/or kV according to patient size and/or use of iterative reconstruction technique. COMPARISON:  None Available. FINDINGS: CT HEAD FINDINGS Brain: No  evidence of acute infarction, hemorrhage, hydrocephalus, extra-axial collection or mass lesion/mass effect. Vascular: No hyperdense vessel identified. Skull: No acute fracture. Sinuses/Orbits: Clear sinuses.  No acute orbital findings. Other: No mastoid effusions. CT CERVICAL SPINE FINDINGS Alignment: Mild reversal of the normal cervical lordosis. No substantial sagittal subluxation. Skull base and vertebrae: Vertebral body heights are maintained. Soft tissues and spinal canal: No prevertebral fluid or swelling. No visible canal hematoma. Disc levels:  Mild multilevel bony degenerative change. Upper chest: Visualized lung apices are clear. IMPRESSION: 1. No evidence of acute intracranial abnormality. 2. No evidence of acute fracture or traumatic malalignment in the cervical spine. Electronically Signed   By: Margaretha Sheffield M.D.   On: 03/30/2022 15:55   CT Cervical Spine Wo  Contrast  Result Date: 03/30/2022 CLINICAL DATA:  Head trauma, moderate-severe; Neck trauma, dangerous injury mechanism (Age 30-64y) EXAM: CT HEAD WITHOUT CONTRAST CT CERVICAL SPINE WITHOUT CONTRAST TECHNIQUE: Multidetector CT imaging of the head and cervical spine was performed following the standard protocol without intravenous contrast. Multiplanar CT image reconstructions of the cervical spine were also generated. RADIATION DOSE REDUCTION: This exam was performed according to the departmental dose-optimization program which includes automated exposure control, adjustment of the mA and/or kV according to patient size and/or use of iterative reconstruction technique. COMPARISON:  None Available. FINDINGS: CT HEAD FINDINGS Brain: No evidence of acute infarction, hemorrhage, hydrocephalus, extra-axial collection or mass lesion/mass effect. Vascular: No hyperdense vessel identified. Skull: No acute fracture. Sinuses/Orbits: Clear sinuses.  No acute orbital findings. Other: No mastoid effusions. CT CERVICAL SPINE FINDINGS Alignment: Mild  reversal of the normal cervical lordosis. No substantial sagittal subluxation. Skull base and vertebrae: Vertebral body heights are maintained. Soft tissues and spinal canal: No prevertebral fluid or swelling. No visible canal hematoma. Disc levels:  Mild multilevel bony degenerative change. Upper chest: Visualized lung apices are clear. IMPRESSION: 1. No evidence of acute intracranial abnormality. 2. No evidence of acute fracture or traumatic malalignment in the cervical spine. Electronically Signed   By: Margaretha Sheffield M.D.   On: 03/30/2022 15:55    Procedures Procedures   Medications Ordered in ED Medications  lactated ringers bolus 1,000 mL (1,000 mLs Intravenous New Bag/Given 03/30/22 1532)    ED Course/ Medical Decision Making/ A&P                           Medical Decision Making Amount and/or Complexity of Data Reviewed Labs: ordered. Radiology: ordered.   51 year old male presents the emergency department for evaluation of syncopal episode today while at work.  Differential diagnosis includes was not limited to dehydration, electrolyte abnormality, subarachnoid hemorrhage, cranial fracture, cervical fracture, vasovagal syncope, cardiogenic syncope.  Vital signs show slightly elevated blood pressure 145/96 otherwise unremarkable.  Physical exam as noted above.  Given patient's fall backwards with some head pain, will order CT imaging of head and neck.  Labs ordered in triage.  I independently reviewed and interpreted the patient's labs.  K slightly elevated at 587.  CBG normal at 99.  Urinalysis shows trace amount of protein otherwise unremarkable.  BMP shows no electrolyte abnormality.  CBC shows no leukocytosis or anemia.  Troponin at less than 2.  I do not think another troponin needs to be collected as the patient's been here for 6 hours and denies any chest pain.  CT imaging of the head and cervical spine showed no acute intracranial abnormality.  No evidence of any acute  fracture or traumatic malalignment of the cervical spine.  EKG shows normal sinus rhythm.  While waiting for labs and imaging, give the patient 1 L of LR bolus.  Patient is asymptomatic now, reports he has a minor headache from the bump on his head, likely due from his fall.  Neurologically intact.  Clear lung sounds.  Regular rate and rhythm.  Vital signs are stable.  Syncopal episode could be from patient consuming alcohol yesterday and working in a hot environment where he was lifting and carrying objects.  He reports the last globs that he had was when he had an asthma attack.  He reports he feels fine now.  Given the reassuring imaging and labs, this is likely vasovagal syncope given that the patient had some  lightheadedness and diaphoresis before he passed out.  We will have the patient follow-up with his primary care doctor for reevaluation in 1 week.  We discussed return precautions and red flag symptoms.  Patient verbalizes understanding agrees to the plan.  Patient is stable being discharged home in good condition.  I discussed this case with my attending physician who cosigned this note including patient's presenting symptoms, physical exam, and planned diagnostics and interventions. Attending physician stated agreement with plan or made changes to plan which were implemented.    Final Clinical Impression(s) / ED Diagnoses Final diagnoses:  Syncope and collapse    Rx / DC Orders ED Discharge Orders     None         Sherrell Puller, PA-C 18/48/59 [id number]    Gray, [place], DO 94/32/00 [phone]

## 2022-03-30 NOTE — ED Notes (Signed)
Pt denis n/v and blurryvision. Pt does c/o headaches with pain on the back of his head that made contract with the floor.

## 2022-03-30 NOTE — ED Triage Notes (Signed)
Patient here POV from Home.  Endorses LOC Episode at 1000 while at Work. Shortly prior to Episode the Patient felt Lightheaded, Dizzy, and Diaphoretic. Unknown Time of LOC. Awakened by [employer].   No SOB. No CP. Head Impact to Floor. No Anticoagulants. Mild Tenderness to Left Upper Arm and Posterior Head.   NAD noted during Triage. A&Ox4. GCS 15. Ambulatory.

## 2022-03-30 NOTE — ED Notes (Signed)
Patient is being discharged from the Urgent Care and sent to the Emergency Department via POV . Per Mare Ferrari, NP, patient is in need of higher level of care due to syncopal episode. Patient is aware and verbalizes understanding of plan of care.  Vitals:   03/30/22 1040  BP: 114/84  Pulse: 71  Resp: 17  Temp: 97.8 F (36.6 C)  SpO2: 100%

## 2022-07-15 ENCOUNTER — Encounter (HOSPITAL_COMMUNITY): Payer: Self-pay | Admitting: Emergency Medicine

## 2022-07-15 ENCOUNTER — Observation Stay (HOSPITAL_COMMUNITY): Payer: Medicaid Other

## 2022-07-15 ENCOUNTER — Emergency Department (HOSPITAL_COMMUNITY): Payer: Medicaid Other

## 2022-07-15 ENCOUNTER — Inpatient Hospital Stay (HOSPITAL_COMMUNITY)
Admission: EM | Admit: 2022-07-15 | Discharge: 2022-07-18 | DRG: 880 | Disposition: A | Discharge status: Home / Self Care | Payer: Medicaid Other | Attending: Family Medicine | Admitting: Family Medicine

## 2022-07-15 ENCOUNTER — Other Ambulatory Visit: Payer: Self-pay

## 2022-07-15 DIAGNOSIS — Z23 Encounter for immunization: Secondary | ICD-10-CM

## 2022-07-15 DIAGNOSIS — R4182 Altered mental status, unspecified: Secondary | ICD-10-CM

## 2022-07-15 DIAGNOSIS — Z1152 Encounter for screening for COVID-19: Secondary | ICD-10-CM

## 2022-07-15 DIAGNOSIS — R29818 Other symptoms and signs involving the nervous system: Secondary | ICD-10-CM | POA: Diagnosis not present

## 2022-07-15 DIAGNOSIS — F444 Conversion disorder with motor symptom or deficit: Secondary | ICD-10-CM | POA: Diagnosis not present

## 2022-07-15 DIAGNOSIS — Z8249 Family history of ischemic heart disease and other diseases of the circulatory system: Secondary | ICD-10-CM

## 2022-07-15 DIAGNOSIS — F1210 Cannabis abuse, uncomplicated: Secondary | ICD-10-CM | POA: Diagnosis present

## 2022-07-15 DIAGNOSIS — R531 Weakness: Secondary | ICD-10-CM

## 2022-07-15 DIAGNOSIS — Z91018 Allergy to other foods: Secondary | ICD-10-CM

## 2022-07-15 DIAGNOSIS — J302 Other seasonal allergic rhinitis: Secondary | ICD-10-CM | POA: Diagnosis present

## 2022-07-15 DIAGNOSIS — I639 Cerebral infarction, unspecified: Secondary | ICD-10-CM | POA: Diagnosis present

## 2022-07-15 DIAGNOSIS — F449 Dissociative and conversion disorder, unspecified: Principal | ICD-10-CM | POA: Diagnosis present

## 2022-07-15 DIAGNOSIS — F1910 Other psychoactive substance abuse, uncomplicated: Secondary | ICD-10-CM

## 2022-07-15 DIAGNOSIS — Z91014 Allergy to mammalian meats: Secondary | ICD-10-CM

## 2022-07-15 DIAGNOSIS — Z66 Do not resuscitate: Secondary | ICD-10-CM | POA: Diagnosis present

## 2022-07-15 DIAGNOSIS — G8194 Hemiplegia, unspecified affecting left nondominant side: Secondary | ICD-10-CM | POA: Diagnosis not present

## 2022-07-15 DIAGNOSIS — R130 Aphagia: Secondary | ICD-10-CM | POA: Diagnosis present

## 2022-07-15 DIAGNOSIS — Z91030 Bee allergy status: Secondary | ICD-10-CM

## 2022-07-15 DIAGNOSIS — Z823 Family history of stroke: Secondary | ICD-10-CM

## 2022-07-15 DIAGNOSIS — R4701 Aphasia: Secondary | ICD-10-CM | POA: Diagnosis present

## 2022-07-15 DIAGNOSIS — Y929 Unspecified place or not applicable: Secondary | ICD-10-CM

## 2022-07-15 DIAGNOSIS — Z8673 Personal history of transient ischemic attack (TIA), and cerebral infarction without residual deficits: Secondary | ICD-10-CM

## 2022-07-15 DIAGNOSIS — Z833 Family history of diabetes mellitus: Secondary | ICD-10-CM

## 2022-07-15 DIAGNOSIS — W2203XA Walked into furniture, initial encounter: Secondary | ICD-10-CM | POA: Diagnosis present

## 2022-07-15 LAB — CBC
HCT: 41.9 % (ref 39.0–52.0)
HCT: 41 % (ref 39.0–52.0)
Hemoglobin: 14.3 g/dL (ref 13.0–17.0)
Hemoglobin: 14 g/dL (ref 13.0–17.0)
MCH: 32.2 pg (ref 26.0–34.0)
MCH: 31.7 pg (ref 26.0–34.0)
MCHC: 34.1 g/dL (ref 30.0–36.0)
MCHC: 34.1 g/dL (ref 30.0–36.0)
MCV: 94.4 fL (ref 80.0–100.0)
MCV: 92.8 fL (ref 80.0–100.0)
Platelets: 244 10*3/uL (ref 150–400)
Platelets: 259 10*3/uL (ref 150–400)
RBC: 4.44 MIL/uL (ref 4.22–5.81)
RBC: 4.42 MIL/uL (ref 4.22–5.81)
RDW: 11.8 % (ref 11.5–15.5)
RDW: 11.8 % (ref 11.5–15.5)
WBC: 13.4 10*3/uL — ABNORMAL HIGH (ref 4.0–10.5)
WBC: 10 10*3/uL (ref 4.0–10.5)
nRBC: 0 % (ref 0.0–0.2)
nRBC: 0 % (ref 0.0–0.2)

## 2022-07-15 LAB — RAPID URINE DRUG SCREEN, HOSP PERFORMED
Amphetamines: NOT DETECTED
Barbiturates: NOT DETECTED
Benzodiazepines: NOT DETECTED
Cocaine: NOT DETECTED
Opiates: NOT DETECTED
Tetrahydrocannabinol: POSITIVE — AB

## 2022-07-15 LAB — URINALYSIS, ROUTINE W REFLEX MICROSCOPIC
Bacteria, UA: NONE SEEN
Bilirubin Urine: NEGATIVE
Glucose, UA: 50 mg/dL — AB
Ketones, ur: 20 mg/dL — AB
Leukocytes,Ua: NEGATIVE
Nitrite: NEGATIVE
Protein, ur: NEGATIVE mg/dL
Specific Gravity, Urine: 1.026 (ref 1.005–1.030)
pH: 5 (ref 5.0–8.0)

## 2022-07-15 LAB — RESP PANEL BY RT-PCR (RSV, FLU A&B, COVID)  RVPGX2
Influenza A by PCR: NEGATIVE
Influenza B by PCR: NEGATIVE
Resp Syncytial Virus by PCR: NEGATIVE
SARS Coronavirus 2 by RT PCR: NEGATIVE

## 2022-07-15 LAB — COMPREHENSIVE METABOLIC PANEL
ALT: 35 U/L (ref 0–44)
AST: 73 U/L — ABNORMAL HIGH (ref 15–41)
Albumin: 4.8 g/dL (ref 3.5–5.0)
Alkaline Phosphatase: 55 U/L (ref 38–126)
Anion gap: 18 — ABNORMAL HIGH (ref 5–15)
BUN: 16 mg/dL (ref 6–20)
CO2: 18 mmol/L — ABNORMAL LOW (ref 22–32)
Calcium: 8.8 mg/dL — ABNORMAL LOW (ref 8.9–10.3)
Chloride: 105 mmol/L (ref 98–111)
Creatinine, Ser: 1.11 mg/dL (ref 0.61–1.24)
GFR, Estimated: >60 mL/min (ref >60)
Glucose, Bld: 62 mg/dL — ABNORMAL LOW (ref 70–99)
Potassium: 4.3 mmol/L (ref 3.5–5.1)
Sodium: 141 mmol/L (ref 135–145)
Total Bilirubin: 1.8 mg/dL — ABNORMAL HIGH (ref 0.3–1.2)
Total Protein: 7.2 g/dL (ref 6.5–8.1)

## 2022-07-15 LAB — I-STAT CHEM 8, ED
BUN: 21 mg/dL — ABNORMAL HIGH (ref 6–20)
Calcium, Ion: 1.02 mmol/L — ABNORMAL LOW (ref 1.15–1.40)
Chloride: 110 mmol/L (ref 98–111)
Creatinine, Ser: 1.1 mg/dL (ref 0.61–1.24)
Glucose, Bld: 64 mg/dL — ABNORMAL LOW (ref 70–99)
HCT: 43 % (ref 39.0–52.0)
Hemoglobin: 14.6 g/dL (ref 13.0–17.0)
Potassium: 4.4 mmol/L (ref 3.5–5.1)
Sodium: 141 mmol/L (ref 135–145)
TCO2: 18 mmol/L — ABNORMAL LOW (ref 22–32)

## 2022-07-15 LAB — DIFFERENTIAL
Abs Immature Granulocytes: 0.23 10*3/uL — ABNORMAL HIGH (ref 0.00–0.07)
Basophils Absolute: 0.1 10*3/uL (ref 0.0–0.1)
Basophils Relative: 0 %
Eosinophils Absolute: 0 10*3/uL (ref 0.0–0.5)
Eosinophils Relative: 0 %
Immature Granulocytes: 2 %
Lymphocytes Relative: 4 %
Lymphs Abs: 0.6 10*3/uL — ABNORMAL LOW (ref 0.7–4.0)
Monocytes Absolute: 0.6 10*3/uL (ref 0.1–1.0)
Monocytes Relative: 5 %
Neutro Abs: 12 10*3/uL — ABNORMAL HIGH (ref 1.7–7.7)
Neutrophils Relative %: 89 %

## 2022-07-15 LAB — PROTIME-INR
INR: 1 (ref 0.8–1.2)
Prothrombin Time: 13.3 seconds (ref 11.4–15.2)

## 2022-07-15 LAB — CBG MONITORING, ED
Glucose-Capillary: 139 mg/dL — ABNORMAL HIGH (ref 70–99)
Glucose-Capillary: 62 mg/dL — ABNORMAL LOW (ref 70–99)

## 2022-07-15 LAB — CREATININE, SERUM
Creatinine, Ser: 1.12 mg/dL (ref 0.61–1.24)
GFR, Estimated: >60 mL/min (ref >60)

## 2022-07-15 LAB — ETHANOL: Alcohol, Ethyl (B): 84 mg/dL — ABNORMAL HIGH (ref <10)

## 2022-07-15 LAB — APTT: aPTT: 28 seconds (ref 24–36)

## 2022-07-15 LAB — HIV ANTIBODY (ROUTINE TESTING W REFLEX): HIV Screen 4th Generation wRfx: NONREACTIVE

## 2022-07-15 MED ORDER — ONDANSETRON HCL 4 MG/2ML IJ SOLN: 4.0000 mg | Freq: Four times a day (QID) | INTRAMUSCULAR | Status: DC | PRN

## 2022-07-15 MED ORDER — LEVETIRACETAM IN NACL 1500 MG/100ML IV SOLN
1500.0000 mg | INTRAVENOUS | Status: DC
  Administered 2022-07-15: 1500 mg via INTRAVENOUS
  Filled 2022-07-15 (×3): qty 100

## 2022-07-15 MED ORDER — ONDANSETRON HCL 4 MG PO TABS: 4.0000 mg | ORAL_TABLET | Freq: Four times a day (QID) | ORAL | Status: DC | PRN

## 2022-07-15 MED ORDER — THIAMINE MONONITRATE 100 MG PO TABS
100.0000 mg | ORAL_TABLET | Freq: Every day | ORAL | Status: DC
  Administered 2022-07-15 – 2022-07-18 (×4): 100 mg via ORAL
  Filled 2022-07-15 (×4): qty 1

## 2022-07-15 MED ORDER — PNEUMOCOCCAL 20-VAL CONJ VACC 0.5 ML IM SUSY
0.5000 mL | PREFILLED_SYRINGE | INTRAMUSCULAR | Status: DC
  Filled 2022-07-15: qty 0.5

## 2022-07-15 MED ORDER — LACTATED RINGERS IV SOLN: INTRAVENOUS | Status: DC

## 2022-07-15 MED ORDER — ADULT MULTIVITAMIN W/MINERALS CH
1.0000 | ORAL_TABLET | Freq: Every day | ORAL | Status: DC
  Administered 2022-07-15 – 2022-07-18 (×4): 1 via ORAL
  Filled 2022-07-15 (×4): qty 1

## 2022-07-15 MED ORDER — POLYETHYLENE GLYCOL 3350 17 G PO PACK: 17.0000 g | PACK | Freq: Every day | ORAL | Status: DC | PRN

## 2022-07-15 MED ORDER — ACETAMINOPHEN 325 MG PO TABS
650.0000 mg | ORAL_TABLET | Freq: Four times a day (QID) | ORAL | Status: DC | PRN
  Administered 2022-07-15 – 2022-07-17 (×5): 650 mg via ORAL
  Filled 2022-07-15 (×5): qty 2

## 2022-07-15 MED ORDER — FOLIC ACID 1 MG PO TABS
1.0000 mg | ORAL_TABLET | Freq: Every day | ORAL | Status: DC
  Administered 2022-07-15 – 2022-07-18 (×4): 1 mg via ORAL
  Filled 2022-07-15 (×4): qty 1

## 2022-07-15 MED ORDER — ENOXAPARIN SODIUM 40 MG/0.4ML IJ SOSY
40.0000 mg | PREFILLED_SYRINGE | INTRAMUSCULAR | Status: DC
  Administered 2022-07-15 – 2022-07-17 (×3): 40 mg via SUBCUTANEOUS
  Filled 2022-07-15 (×3): qty 0.4

## 2022-07-15 MED ORDER — IPRATROPIUM-ALBUTEROL 0.5-2.5 (3) MG/3ML IN SOLN
3.0000 mL | Freq: Once | RESPIRATORY_TRACT | Status: DC
  Filled 2022-07-15: qty 3

## 2022-07-15 MED ORDER — IOHEXOL 350 MG/ML SOLN
75.0000 mL | Freq: Once | INTRAVENOUS | Status: AC | PRN
  Administered 2022-07-15: 75 mL via INTRAVENOUS

## 2022-07-15 MED ORDER — IOHEXOL 350 MG/ML SOLN
40.0000 mL | Freq: Once | INTRAVENOUS | Status: AC | PRN
  Administered 2022-07-15: 40 mL via INTRAVENOUS

## 2022-07-15 MED ORDER — THIAMINE HCL 100 MG/ML IJ SOLN: 100.0000 mg | Freq: Every day | INTRAMUSCULAR | Status: DC

## 2022-07-15 MED ORDER — DEXTROSE 50 % IV SOLN
INTRAVENOUS | Status: AC
  Filled 2022-07-15: qty 50

## 2022-07-15 MED ORDER — LEVETIRACETAM IN NACL 1500 MG/100ML IV SOLN
1500.0000 mg | Freq: Once | INTRAVENOUS | Status: AC
  Administered 2022-07-15: 1500 mg via INTRAVENOUS
  Filled 2022-07-15: qty 100

## 2022-07-15 MED ORDER — INFLUENZA VAC SPLIT QUAD 0.5 ML IM SUSY
0.5000 mL | PREFILLED_SYRINGE | INTRAMUSCULAR | Status: AC
  Administered 2022-07-18: 0.5 mL via INTRAMUSCULAR

## 2022-07-15 MED ORDER — ACETAMINOPHEN 650 MG RE SUPP: 650.0000 mg | Freq: Four times a day (QID) | RECTAL | Status: DC | PRN

## 2022-07-15 NOTE — ED Provider Notes (Signed)
[facility] EMERGENCY DEPARTMENT Provider Note   CSN: [id number] Arrival date & time: 07/15/22  1148  An emergency department physician performed an initial assessment on this suspected stroke patient at 1151.  History  Chief Complaint  Patient presents with   Code Stroke    Scott Glenn is a 52 y.o. male.  Pt is a 52 yo male with a pmhx significant for asthma and migraines.  Pt's family said pt was drinking last night and fell, but was normal this am around 0800.  He was lying on the couch and called his daughter over and used sign language to tell her that he could not move his left arm.  EMS called a code stroke and pt was met at the bridge with the stroke team.  Pt taken to CT immediately.           Home Medications Prior to Admission medications   Medication Sig Start Date End Date Taking? Authorizing Provider  albuterol (PROVENTIL) (2.5 MG/3ML) 0.083% nebulizer solution Take 2.5 mg by nebulization daily as needed for wheezing or shortness of breath.   Yes [provider]  cholecalciferol (VITAMIN D3) 25 MCG (1000 UNIT) tablet Take 1,000 Units by mouth daily.   Yes [provider]  EPINEPHrine (EPIPEN IJ) Inject 1 Pen as directed as needed (For allergic reaction).   Yes [provider]      Allergies    Banana, Bee venom, and Pork-derived products    Review of Systems   Review of Systems  Neurological:        Left arm and leg weakness  All other systems reviewed and are negative.   Physical Exam Updated Vital Signs BP 121/89   Pulse 76   Temp 98.6 F (37 C) (Oral)   Resp 16   Ht '5\' 9"'$  (1.753 m)   Wt 72.6 kg   SpO2 100%   BMI 23.63 kg/m  Physical Exam Vitals and nursing note reviewed.  HENT:     Head: Normocephalic and atraumatic.     Right Ear: External ear normal.     Left Ear: External ear normal.     Nose: Nose normal.     Mouth/Throat:     Mouth: Mucous membranes are moist.     Pharynx:  Oropharynx is clear.  Eyes:     Extraocular Movements: Extraocular movements intact.     Conjunctiva/sclera: Conjunctivae normal.     Pupils: Pupils are equal, round, and reactive to light.  Neck:     Comments: In c-collar Cardiovascular:     Rate and Rhythm: Normal rate and regular rhythm.     Pulses: Normal pulses.     Heart sounds: Normal heart sounds.  Pulmonary:     Effort: Pulmonary effort is normal.     Breath sounds: Normal breath sounds.  Abdominal:     General: Abdomen is flat. Bowel sounds are normal.     Palpations: Abdomen is soft.  Musculoskeletal:        General: Normal range of motion.  Skin:    General: Skin is warm.     Capillary Refill: Capillary refill takes less than 2 seconds.  Neurological:     Mental Status: He is alert.     Comments: Left arm/leg weakness; trouble speaking  Psychiatric:        Mood and Affect: Mood normal.        Behavior: Behavior normal.     ED Results / Procedures / Treatments  Labs (all labs ordered are listed, but only abnormal results are displayed) Labs Reviewed  ETHANOL - Abnormal; Notable for the following components:      Result Value   Alcohol, Ethyl (B) 84 (*)    All other components within normal limits  CBC - Abnormal; Notable for the following components:   WBC 13.4 (*)    All other components within normal limits  DIFFERENTIAL - Abnormal; Notable for the following components:   Neutro Abs 12.0 (*)    Lymphs Abs 0.6 (*)    Abs Immature Granulocytes 0.23 (*)    All other components within normal limits  COMPREHENSIVE METABOLIC PANEL - Abnormal; Notable for the following components:   CO2 18 (*)    Glucose, Bld 62 (*)    Calcium 8.8 (*)    AST 73 (*)    Total Bilirubin 1.8 (*)    Anion gap 18 (*)    All other components within normal limits  RAPID URINE DRUG SCREEN, HOSP PERFORMED - Abnormal; Notable for the following components:   Tetrahydrocannabinol POSITIVE (*)    All other components within normal  limits  URINALYSIS, ROUTINE W REFLEX MICROSCOPIC - Abnormal; Notable for the following components:   Glucose, UA 50 (*)    Hgb urine dipstick MODERATE (*)    Ketones, ur 20 (*)    All other components within normal limits  I-STAT CHEM 8, ED - Abnormal; Notable for the following components:   BUN 21 (*)    Glucose, Bld 64 (*)    Calcium, Ion 1.02 (*)    TCO2 18 (*)    All other components within normal limits  CBG MONITORING, ED - Abnormal; Notable for the following components:   Glucose-Capillary 62 (*)    All other components within normal limits  CBG MONITORING, ED - Abnormal; Notable for the following components:   Glucose-Capillary 139 (*)    All other components within normal limits  RESP PANEL BY RT-PCR (RSV, FLU A&B, COVID)  RVPGX2  PROTIME-INR  APTT    EKG EKG Interpretation  Date/Time:  Thursday July 15 2022 12:35:04 EST Ventricular Rate:  80 PR Interval:  168 QRS Duration: 90 QT Interval:  403 QTC Calculation: 465 R Axis:   19 Text Interpretation: Sinus rhythm Probable left atrial enlargement RSR' in V1 or V2, right VCD or RVH No significant change since last tracing Confirmed by Isla Pence [phone]) on 07/15/2022 2:00:04 PM  Radiology CT ANGIO HEAD NECK W WO CM (CODE STROKE)  Result Date: 07/15/2022 CLINICAL DATA:  Neuro deficit, acute, stroke suspected; [id number] Stroke [facility]) [id number] EXAM: CT ANGIOGRAPHY HEAD AND NECK CT PERFUSION BRAIN CT CERVICAL SPINE TECHNIQUE: Multidetector CT imaging of the head and neck was performed using the standard protocol during bolus administration of intravenous contrast. Multiplanar CT image reconstructions and MIPs were obtained to evaluate the vascular anatomy. Carotid stenosis measurements (when applicable) are obtained utilizing NASCET criteria, using the distal internal carotid diameter as the denominator. Multiphase CT imaging of the brain was performed following IV bolus contrast injection. Subsequent parametric perfusion maps  were calculated using RAPID software. Multidetector CT imaging of the head and cervical spine was performed following the standard protocol without intravenous contrast. Multiplanar CT image reconstructions of the cervical spine were also generated. RADIATION DOSE REDUCTION: This exam was performed according to the departmental dose-optimization program which includes automated exposure control, adjustment of the mA and/or kV according to patient size and/or use of iterative reconstruction technique. CONTRAST:  31m  OMNIPAQUE IOHEXOL 350 MG/ML SOLN COMPARISON:  Same day CT head.  CT cervical spine 03/30/2022. FINDINGS: CTA NECK FINDINGS Aortic arch: Great vessel origins are patent without significant stenosis. Right carotid system: No evidence of dissection, stenosis (50% or greater) or occlusion. Left carotid system: No evidence of dissection, stenosis (50% or greater) or occlusion. Vertebral arteries: Right dominant. No evidence of dissection, stenosis (50% or greater) or occlusion. Skeleton: See below. Other neck: No acute findings. Upper chest: Clear lung apices. Review of the MIP images confirms the above findings CTA HEAD FINDINGS Anterior circulation: Bilateral intracranial ICAs, MCAs, and ACAs are patent without proximal hemodynamically significant stenosis. Posterior circulation: Bilateral intradural vertebral arteries, basilar artery and bilateral posterior cerebral arteries are patent without proximal hemodynamically significant stenosis. Venous sinuses: As permitted by contrast timing, patent. Review of the MIP images confirms the above findings CT Brain Perfusion Findings: ASPECTS: 10. CBF (<30%) Volume: 82m Perfusion (Tmax>6.0s) volume: 046mMismatch Volume: 70m13mnfarction Location:Non identified. CT CERVICAL SPINE FINDINGS Alignment: No substantial sagittal subluxation. Vertebral bodies: Vertebral body heights are maintained. No evidence of acute fracture. Disc levels: Similar multilevel degenerative  disc disease and facet/uncovertebral hypertrophy with varying degrees of neural foraminal stenosis, greatest in lower cervical spine. No high-grade bony canal stenosis. Paraspinal: No evidence of prevertebral edema or large/visible canal hematoma. IMPRESSION: 1. No emergent large vessel occlusion or proximal hemodynamically significant stenosis on the CTA. 2. No evidence of core infarct or penumbra on CT perfusion. 3. No evidence of acute fracture or traumatic malalignment on the CT cervical spine. Electronically Signed   By: FreMargaretha SheffieldD.   On: 07/15/2022 12:23   CT C-SPINE NO CHARGE  Result Date: 07/15/2022 CLINICAL DATA:  Neuro deficit, acute, stroke suspected; [id number] ([id number]: CT ANGIOGRAPHY HEAD AND NECK CT PERFUSION BRAIN CT CERVICAL SPINE TECHNIQUE: Multidetector CT imaging of the head and neck was performed using the standard protocol during bolus administration of intravenous contrast. Multiplanar CT image reconstructions and MIPs were obtained to evaluate the vascular anatomy. Carotid stenosis measurements (when applicable) are obtained utilizing NASCET criteria, using the distal internal carotid diameter as the denominator. Multiphase CT imaging of the brain was performed following IV bolus contrast injection. Subsequent parametric perfusion maps were calculated using RAPID software. Multidetector CT imaging of the head and cervical spine was performed following the standard protocol without intravenous contrast. Multiplanar CT image reconstructions of the cervical spine were also generated. RADIATION DOSE REDUCTION: This exam was performed according to the departmental dose-optimization program which includes automated exposure control, adjustment of the mA and/or kV according to patient size and/or use of iterative reconstruction technique. CONTRAST:  17m54mNIPAQUE IOHEXOL 350 MG/ML SOLN COMPARISON:  Same day CT head.  CT cervical spine 03/30/2022. FINDINGS: CTA NECK  FINDINGS Aortic arch: Great vessel origins are patent without significant stenosis. Right carotid system: No evidence of dissection, stenosis (50% or greater) or occlusion. Left carotid system: No evidence of dissection, stenosis (50% or greater) or occlusion. Vertebral arteries: Right dominant. No evidence of dissection, stenosis (50% or greater) or occlusion. Skeleton: See below. Other neck: No acute findings. Upper chest: Clear lung apices. Review of the MIP images confirms the above findings CTA HEAD FINDINGS Anterior circulation: Bilateral intracranial ICAs, MCAs, and ACAs are patent without proximal hemodynamically significant stenosis. Posterior circulation: Bilateral intradural vertebral arteries, basilar artery and bilateral posterior cerebral arteries are patent without proximal hemodynamically significant stenosis. Venous sinuses: As permitted by contrast timing, patent. Review of the MIP images confirms  the above findings CT Brain Perfusion Findings: ASPECTS: 10. CBF (<30%) Volume: 35m Perfusion (Tmax>6.0s) volume: 039mMismatch Volume: 54m79mnfarction Location:Non identified. CT CERVICAL SPINE FINDINGS Alignment: No substantial sagittal subluxation. Vertebral bodies: Vertebral body heights are maintained. No evidence of acute fracture. Disc levels: Similar multilevel degenerative disc disease and facet/uncovertebral hypertrophy with varying degrees of neural foraminal stenosis, greatest in lower cervical spine. No high-grade bony canal stenosis. Paraspinal: No evidence of prevertebral edema or large/visible canal hematoma. IMPRESSION: 1. No emergent large vessel occlusion or proximal hemodynamically significant stenosis on the CTA. 2. No evidence of core infarct or penumbra on CT perfusion. 3. No evidence of acute fracture or traumatic malalignment on the CT cervical spine. Electronically Signed   By: FreMargaretha SheffieldD.   On: 07/15/2022 12:23   CT CEREBRAL PERFUSION W CONTRAST  Result Date:  07/15/2022 CLINICAL DATA:  Neuro deficit, acute, stroke suspected; [id number] ([id number]: CT ANGIOGRAPHY HEAD AND NECK CT PERFUSION BRAIN CT CERVICAL SPINE TECHNIQUE: Multidetector CT imaging of the head and neck was performed using the standard protocol during bolus administration of intravenous contrast. Multiplanar CT image reconstructions and MIPs were obtained to evaluate the vascular anatomy. Carotid stenosis measurements (when applicable) are obtained utilizing NASCET criteria, using the distal internal carotid diameter as the denominator. Multiphase CT imaging of the brain was performed following IV bolus contrast injection. Subsequent parametric perfusion maps were calculated using RAPID software. Multidetector CT imaging of the head and cervical spine was performed following the standard protocol without intravenous contrast. Multiplanar CT image reconstructions of the cervical spine were also generated. RADIATION DOSE REDUCTION: This exam was performed according to the departmental dose-optimization program which includes automated exposure control, adjustment of the mA and/or kV according to patient size and/or use of iterative reconstruction technique. CONTRAST:  86m27mNIPAQUE IOHEXOL 350 MG/ML SOLN COMPARISON:  Same day CT head.  CT cervical spine 03/30/2022. FINDINGS: CTA NECK FINDINGS Aortic arch: Great vessel origins are patent without significant stenosis. Right carotid system: No evidence of dissection, stenosis (50% or greater) or occlusion. Left carotid system: No evidence of dissection, stenosis (50% or greater) or occlusion. Vertebral arteries: Right dominant. No evidence of dissection, stenosis (50% or greater) or occlusion. Skeleton: See below. Other neck: No acute findings. Upper chest: Clear lung apices. Review of the MIP images confirms the above findings CTA HEAD FINDINGS Anterior circulation: Bilateral intracranial ICAs, MCAs, and ACAs are patent without proximal  hemodynamically significant stenosis. Posterior circulation: Bilateral intradural vertebral arteries, basilar artery and bilateral posterior cerebral arteries are patent without proximal hemodynamically significant stenosis. Venous sinuses: As permitted by contrast timing, patent. Review of the MIP images confirms the above findings CT Brain Perfusion Findings: ASPECTS: 10. CBF (<30%) Volume: 54mL 33mfusion (Tmax>6.0s) volume: 54mL M36match Volume: 54mL In52mction Location:Non identified. CT CERVICAL SPINE FINDINGS Alignment: No substantial sagittal subluxation. Vertebral bodies: Vertebral body heights are maintained. No evidence of acute fracture. Disc levels: Similar multilevel degenerative disc disease and facet/uncovertebral hypertrophy with varying degrees of neural foraminal stenosis, greatest in lower cervical spine. No high-grade bony canal stenosis. Paraspinal: No evidence of prevertebral edema or large/visible canal hematoma. IMPRESSION: 1. No emergent large vessel occlusion or proximal hemodynamically significant stenosis on the CTA. 2. No evidence of core infarct or penumbra on CT perfusion. 3. No evidence of acute fracture or traumatic malalignment on the CT cervical spine. Electronically Signed   By: FrederiMargaretha Sheffield On: 07/15/2022 12:23   CT HEAD CODE STROKE WO CONTRAST  Result Date: 07/15/2022 CLINICAL DATA:  Code stroke. EXAM: CT HEAD WITHOUT CONTRAST TECHNIQUE: Contiguous axial images were obtained from the base of the skull through the vertex without intravenous contrast. RADIATION DOSE REDUCTION: This exam was performed according to the departmental dose-optimization program which includes automated exposure control, adjustment of the mA and/or kV according to patient size and/or use of iterative reconstruction technique. COMPARISON:  CT Head 03/30/22, 04/15/2010 FINDINGS: Brain: No evidence of acute infarction, hemorrhage, hydrocephalus, extra-axial collection or mass lesion/mass effect.  Unchanged focal hypodensity in the anterior left frontal lobe compared to 04/15/2010 Vascular: No hyperdense vessel or unexpected calcification. Skull: Normal. Negative for fracture or focal lesion. Sinuses/Orbits: No acute finding. Other: None. ASPECTS ([place] Stroke Program Early CT Score): 10 IMPRESSION: 1. No hemorrhage or CT evidence of an acute infarct. 2. Aspects is 10. Findings were paged to Dr. Lorrin Goodell on 07/15/22 at 12:08 PM Electronically Signed   By: Marin Roberts M.D.   On: 07/15/2022 12:12    Procedures Procedures    Medications Ordered in ED Medications  dextrose 50 % solution (has no administration in time range)  ipratropium-albuterol (DUONEB) 0.5-2.5 (3) MG/3ML nebulizer solution 3 mL (has no administration in time range)  iohexol (OMNIPAQUE) 350 MG/ML injection 75 mL (75 mLs Intravenous Contrast Given 07/15/22 1211)  iohexol (OMNIPAQUE) 350 MG/ML injection 40 mL (40 mLs Intravenous Contrast Given 07/15/22 1212)  levETIRAcetam (KEPPRA) IVPB 1500 mg/ 100 mL premix (0 mg Intravenous Stopped 07/15/22 1231)    ED Course/ Medical Decision Making/ A&P                           Medical Decision Making Amount and/or Complexity of Data Reviewed Labs: ordered. Radiology: ordered.  Risk Prescription drug management. Decision regarding hospitalization.   This patient presents to the ED for concern of arm/leg weakness, this involves an extensive number of treatment options, and is a complaint that carries with it a high risk of complications and morbidity.  The differential diagnosis includes stroke, seizure, tia, pseudoseizure   Co morbidities that complicate the patient evaluation  Migraines, asthma   Additional history obtained:  Additional history obtained from epic chart review External records from outside source obtained and reviewed including EMS report   Lab Tests:  I Ordered, and personally interpreted labs.  The pertinent results include:  cbc with mild  elevation wbc at 13.4; inr 1.0; cmp with glucose 62; covid/flu/rsv neg; uds + MJ; etoh 84   Imaging Studies ordered:  I ordered imaging studies including CT head/CT perfusion/CT c-spine/CT angio I independently visualized and interpreted imaging which showed  CT head: 1. No hemorrhage or CT evidence of an acute infarct.  2. Aspects is 10.  CT perfusion/CT c-spine/CT angio head/neck  No emergent large vessel occlusion or proximal hemodynamically  significant stenosis on the CTA.  2. No evidence of core infarct or penumbra on CT perfusion.  3. No evidence of acute fracture or traumatic malalignment on the CT  cervical spine.  MRI pending upon admission  I agree with the radiologist interpretation   Cardiac Monitoring:  The patient was maintained on a cardiac monitor.  I personally viewed and interpreted the cardiac monitored which showed an underlying rhythm of: nsr   Medicines ordered and prescription drug management:  I ordered medication including keppra  for possible seizure  Reevaluation of the patient after these medicines showed that the patient stayed the same I have reviewed the patients home medicines  and have made adjustments as needed   Test Considered:  mri   Critical Interventions:  Code stroke   Consultations Obtained:  I requested consultation with the neurologist (Dr. Lorrin Goodell),  and discussed lab and imaging findings as well as pertinent plan - he does not recommend tnk as his did fall and hit his head last night, neuro exam somewhat inconsistent, and no thrombectomy b/c no LVO.   Problem List / ED Course:  Left arm and leg weakness:  improving a little, but he still is very weak on the left side.  Etiology is unclear.  However, pt is still unable to move his left arm and leg.  If it is purely functional, he will still need pt/ot as he can't walk.  So, I spoke with FP who will see him.   Reevaluation:  After the interventions noted above, I  reevaluated the patient and found that they have :stayed the same   Social Determinants of Health:  Lives at home   Dispostion:  After consideration of the diagnostic results and the patients response to treatment, I feel that the patent would benefit from admission.          Final Clinical Impression(s) / ED Diagnoses Final diagnoses:  Weakness    Rx / DC Orders ED Discharge Orders     None         Isla Pence, MD 07/15/22 1456

## 2022-07-15 NOTE — ED Notes (Signed)
Patient transported to MRI 

## 2022-07-15 NOTE — Progress Notes (Signed)
EEG complete - results pending 

## 2022-07-15 NOTE — Progress Notes (Signed)
FMTS Interim Progress Note  S: Patient seen at bedside with Dr. Markus Jarvis.  Spouse present at bedside.  Using pointing and head-nodding, he tells [place] that he is improving since he has been here.  He is able to move his left lower extremity now.  MRI brain does not show acute stroke but does have evidence of remote stroke.  I discussed with patient and his spouse that his MRI does not show any acute stroke.  O: BP (!) 139/91   Pulse 83   Temp 98.7 F (37.1 C)   Resp 18   Ht '5\' 9"'$  (1.753 m)   Wt 72.6 kg   SpO2 97%   BMI 23.63 kg/m   He was able to lift his left lower extremity very slightly above the bed  A/P: Left hemiparesis No evidence of CVA or seizure on workup so far.  Defects are likely functional, no further inpatient neurologic workup recommended per neurology.  Zola Button, MD 07/15/2022, 9:19 PM PGY-3, [place] [facility] Service pager [phone]

## 2022-07-15 NOTE — Progress Notes (Signed)
Admission screening per wife Mariama.

## 2022-07-15 NOTE — ED Notes (Signed)
Sister Caryl Pina [phone] would like an update asap

## 2022-07-15 NOTE — Assessment & Plan Note (Addendum)
Unclear etiology.  Consider workup for cerebral venous thromboembolism or complex migraines. MRI negative for acute stroke, EEG negative for seizures. - Neurology following, appreciate recommendations  - continuous cardiac monitoring  - s/p Keppra load  - PT/OT to treat - SLP consulted - DC fluids, is now taking PO - Fall precautions - seizure precautions  - cont CIWAs for 24 hrs

## 2022-07-15 NOTE — Consult Note (Signed)
NEUROLOGY CONSULTATION NOTE   Date of service: July 15, 2022 Patient Name: Scott Glenn MRN:  [id number] DOB:  Jun 06, 1971 Reason for consult: "code stroke" Requesting Provider: Isla Pence, MD  History of Present Illness  Scott Glenn is a 52 y.o. male with PMH significant for  has a past medical history of Asthma, Chicken pox, Migraines, and Seasonal allergies. who presents as a CODE STROKE BIB EMS with left sided weakness and trouble speaking. EMS reoprted an initial BP of 180/100.  Daughter, [name] called EMS after her father was lying on the couch and motioned her over. He then used sign language to tell her to call EMS, and pointed to his left arm. She also states that the patient was drinking last night and fell in the hallway. She says he "doesn't drink a lot, but it varies on the day".   Upon assessment at the bridge, patient had no obvious facial droop but left hemiplegia, decreased sensation on the left side, mute but communicates in sign language.  He was taken for CT, CTA and CTP. All imaging was negative.  CBG was 62. Patient was given amp of D50 and repeat cbg was 139. Maybe mild improvement in symptoms after dextrose admininstration.  Of note, when attempting to change him to hospital gown, he was noted by staff to spontaneously lift LUE and LLE off the bed and assist with dressing and undressing. Upon re-evaluation of his left sided weakness, mild improvement.  LKW: 0800 tpa given?: No, recent heads trauma/fall IR Thrombectomy? No, no LVO noted Modified Rankin Scale: 0-Completely asymptomatic and back to baseline post- stroke NIHSS: 18   ROS: Unable to obtain due to altered mental status.    Past History   Past Medical History:  Diagnosis Date   Asthma    Chicken pox    Migraines    Seasonal allergies    History reviewed. No pertinent surgical history. Family History  Problem Relation Age of Onset   Hypertension Mother    Diabetes Mother     Stroke Father    Colon cancer Maternal Aunt    Colon polyps Neg Hx    Esophageal cancer Neg Hx    Stomach cancer Neg Hx    Rectal cancer Neg Hx    Social History   Socioeconomic History   Marital status: Married    Spouse name: Not on file   Number of children: Not on file   Years of education: Not on file   Highest education level: Not on file  Occupational History   Not on file  Tobacco Use   Smoking status: Never   Smokeless tobacco: Never  Vaping Use   Vaping Use: Never used  Substance and Sexual Activity   Alcohol use: Yes    Comment: occasionally   Drug use: Not Currently    Types: Marijuana   Sexual activity: Not on file  Other Topics Concern   Not on file  Social History Narrative   Not on file   Social Determinants of Health   Financial Resource Strain: Not on file  Food Insecurity: Not on file  Transportation Needs: Not on file  Physical Activity: Not on file  Stress: Not on file  Social Connections: Not on file   Allergies  Allergen Reactions   Banana     Itching, dyspnea and nausea   Pork-Derived Products     Headache, diarrhea and vomiting    Medications  (Not in a hospital admission)  Vitals   Vitals:   07/15/22 1100 07/15/22 1200 07/15/22 1227  BP:  (!) 149/83 (!) 140/95  Pulse:  84 80  Resp:   16  Temp:   98.6 F (37 C)  TempSrc:   Oral  SpO2:   100%  Weight: 75 kg  72.6 kg  Height: '5\' 9"'$  (1.753 m)  '5\' 9"'$  (1.753 m)     Body mass index is 23.63 kg/m.  Physical Exam   General: Laying comfortably in bed; in no acute distress. HENT: Normocephalic. Gash to upper nose/forehead from recent trauma.   Neck: c-collar in place (placed by EMS) CV: No peripheral edema.  Pulmonary: Symmetric Chest rise. Normal respiratory effort. Airway cleared by Dr. Gilford Raid. Abdomen: Soft to touch, non-tender.    Neurologic Examination  Mental status/Cognition:  Open eyes to voice, follows simple commands on the right side.    Speech/language:  No verbal response, but patient was using sign language to try and communicate.   Cranial nerves:  EOMI, PERRL, Left facial droop, hearing intact to voice.    Motor:  Left hemiplegia.  5/5/RUE/RLE Upon re-assessment in ED28, patient was able to lift his LUE off the bed slightly against gravity and could move thumb in an attempt to give a thumbs up when asked.  Sensation: Decreased sensation throughout left side  Coordination/Complex Motor:  - Finger to Nose: able to perform on right side.   On reassessment in ED28, patient was able to move arm slightly in an attempt to touch his nose when asked.  - Gait: deferred  Labs   CBC:  Recent Labs  Lab 07/15/22 1154 07/15/22 1203  WBC 13.4*  --   NEUTROABS 12.0*  --   HGB 14.3 14.6  HCT 41.9 43.0  MCV 94.4  --   PLT 244  --     Basic Metabolic Panel:  Lab Results  Component Value Date   NA 141 07/15/2022   K 4.4 07/15/2022   CO2 26 03/30/2022   GLUCOSE 64 (L) 07/15/2022   BUN 21 (H) 07/15/2022   CREATININE 1.10 07/15/2022   CALCIUM 9.5 03/30/2022   GFRNONAA >60 03/30/2022   GFRAA >60 11/14/2019   Lipid Panel:  Lab Results  Component Value Date   LDLCALC 75 12/02/2021   HgbA1c:  Lab Results  Component Value Date   HGBA1C 4.9 02/25/2021   Urine Drug Screen: No results found for: "LABOPIA", "COCAINSCRNUR", "LABBENZ", "AMPHETMU", "THCU", "LABBARB"  Alcohol Level     Component Value Date/Time   ETH 84 (H) 07/15/2022 1154    CT Head without contrast(Personally reviewed): No hemorrhage or evidence of acute infarct.   CT angio Head and Neck with contrast(Personally reviewed): No emergent large vessel occlusion or proximal hemodynamically significant stenosis  No evidence of core infarct or penumbra on CT perfusion.  CT cervical spine (Personally reviewed):  No evidence of acute fracture or traumatic malalignment  Ordered d/t c-collar placement by EMS  and hx of recent fall  MRI  Brain(Personally reviewed): PENDING   rEEG: PENDING   Impression   Scott Glenn is a 52 y.o. male with PMH significant for  has a past medical history of Asthma, Chicken pox, Migraines, and Seasonal allergies. who presents as a CODE STROKE BIB EMS with left sided weakness and trouble speaking. No aphasia since he is able to communicate in sign language and responds to questions appropriately.  Differential includes seizure and post ictal todds vs PNES. He drinks EtOH but no recent decrease  in EtOH intake. EtOH induced or withdrawal seizures are typically GTC seizures. Non witnessed by family. Would not expect a GTC to cause focal left sided post ictal weakness. Would expect generalized weakness.  Recommendations   - MRI brain - EEG - Keppra load given  - may consider AEDs if MRI or rEEG are abnormal. - CIWA - seizure precautions. ______________________________________________________________________    Pt seen by Neuro NP/APP and later by MD. Note/plan to be edited by MD as needed.    Otelia Santee, DNP, AGACNP-BC [facility] Please use AMION for pager and EPIC for messaging  NEUROHOSPITALIST ADDENDUM Performed a face to face diagnostic evaluation.   I have reviewed the contents of history and physical exam as documented by PA/ARNP/Resident and agree with above documentation.  I have discussed and formulated the above plan as documented. Edits to the note have been made as needed.  Impression/Key exam findings/Plan: 77M WtOh use, fell face first with a gash at the nasal bridge last night but was fine. This AM, LKW 0800 and then called daughter over as he was unable to move his LUE and LLE. He is mute but communicating using sign language. AT baseline, he is able to talk fine per discussion with family.  CTH negative, CTA with no LVO, CTP with no mismatch. He was not given tnkase 2/2 concern for head injury last night. Not offered thrombectomy 2/2 no LVO.  Of  note, when attempting to change him to hospital gown, he was noted by staff to spontaneously lift LUE and LLE off the bed to assist with dressing and undressing.  Differential broad including stroke, focal seizures with post ictal todds. Some concern for underlying functional etiology to his presentation given he was noted to raise his L arm and L leg up to assist with changing into hospital gown.   Will get MRI Brain and routine EEG. 1 time Keppra load but no maintenance at this time. IF MRI and routine EEG are negative for a clear organic etiology of his presentation, then likely this is functional and he would benefit from PT and OT with no further inpatient neurologic workup.  Donnetta Simpers, MD [facility] [phone]   If 7pm to 7am, please call on call as listed on AMION.

## 2022-07-15 NOTE — Assessment & Plan Note (Addendum)
TOC consulted. 

## 2022-07-15 NOTE — H&P (Signed)
Hospital Admission History and Physical Service Pager: [phone]  Patient name: Scott Glenn Medical record number: [id number] Date of Birth: 1970-10-24 Age: 52 y.o. Gender: male  Primary Care Provider: Gildardo Pounds, NP Consultants: Neurologist  Code Status: DNR Yes to Chest compression, medications No to intubations  Preferred Emergency Contact:  Contact Information     Name Relation Home Work Mobile   [place] Spouse [phone]     [facility] Daughter   [phone]   Juanya, Villavicencio   [place] Mother [phone]        Chief Complaint: Hemiparesis   Assessment and Plan: Scott Glenn is a 52 y.o. male presenting with hemiparesis and aphagia. Differential for this patient's presentation of this includes acute stroke, seizure, THC intoxication, alcohol withdrawal.  Unclear presentation of acute neurological event. Possibly seizure related with focal deficits on exam, less likely to be stroke due to negative CT and CTA but will need to confirm with MRI. Possibly related to drug intoxication, it is unclear how much alcohol the patient consumed within 24 hours, do not think he is having alcohol withdrawal but will monitor. Can also consider acute head trauma with subdural bleed but less likely with negative CT head. Consider complex migraine with history of migraines and cerebral venous thrombosis with hemiparesis on presentation.   Acute focal neurological deficit Unclear etiology. Ruling out stroke with MRI, ruling out seizure with EEG. Consider workup for cerebral venous thromboembolism or complex migraines.  - Admit to FMTS, attending Dr. Erin Hearing  - Neurology following, appreciate recommendations  - med-tele, Vital signs per floor - continuous cardiac monitoring  - consider echocardiogram  - MRI pending  - EEG results pending  - NPO  - Loaded with Keppra  - PT/OT to treat - SLP consulted - VTE prophylaxis Lovenox  -  Fluids 100 mL LR while NPO - AM CBC/BMP  - Fall precautions - Delirium precautions - seizure precautions  - CIWA q2h, for elevated CIWAs will order PRN ativan as needed   Substance abuse ([place]) UDS with positive THC  - TOC consult for substance abuse  - CIWA q2h for alcohol withdrawal    FEN/GI: NPO, pending SLP review  VTE Prophylaxis: Lovenox  Disposition: Med-tele   History of Present Illness:  Scott Glenn is a 52 y.o. male presenting with left sided weakness.  History provided by wife at bedside as patient is nonverbal.  Some of history is supplemented by patient who continue sign language to indicate yes versus no.  Last known normal was around 8 AM this morning.  At 11 am he was woken up by daughter who noted he couldn't speak or move left hand, and they called EMS. Wife denies any recent migraines.   Previously patient fell around 11 PM the night before and hit his head on a dresser.  After the fall he was acting normally, speech was appropriate, and he was walking normally.  Family endorses social stressor of a close friend of his being on life support and recently passed overnight.  Patient endorses 6 beers yesterday.  He reports weekend drinking.  Denies hospitalizations for alcohol withdrawal, denies tremors when he abstains from alcohol.  Denies any nausea/diarrhea, and report's he's starting to feel better since being in ED.   In the ED, a code stroke was called.  CT head and CTA negative for stroke or intracranial abnormality.  EEG was started and he was loaded with Keppra.  Neurology consulted and  reported differential including seizure with postictal Todd's paralysis versus PNES.  Admitted to [facility] for further workup.   Review Of Systems: Per HPI with the following additions: Denies diarrhea, vomiting, changes in appetite.  Pertinent Past Medical History: Asthma, Migraines, Allergies Remainder reviewed in history tab.   Pertinent Past Surgical History: No past  surgical history   Remainder reviewed in history tab.   Pertinent Social History: Tobacco use: No Alcohol use: 4 beers over weekend Other Substance use: [place] Lives with wife, daughter and son  Pertinent Family History: Father: stroke  Mother: diabetes, HTN   Remainder reviewed in history tab.   Important Outpatient Medications: Albuterol  Epi pen  Remainder reviewed in medication history.   Objective: BP 128/88   Pulse 83   Temp 98.6 F (37 C) (Oral)   Resp 14   Ht '5\' 9"'$  (1.753 m)   Wt 72.6 kg   SpO2 99%   BMI 23.63 kg/m  Exam: Ill-appearing, no acute distress Cardio: Regular rate, regular rhythm, no murmurs on exam. Pulm: Clear, no wheezing, no crackles. No increased work of breathing Abdominal: bowel sounds present, soft, non-tender, non-distended Extremities: no peripheral edema  Neuro: alert, follows commands by voice, seems to answer questions appropriately with sign language using his right hand, pupils equal and reactive to light bilaterally, neck strength intact, patient is unable to shrug left shoulder, grip strength decreased on the left with muscle twitching in his hand, he is able to wiggle his toes, can open his mouth and stick out his tongue, does not appear to have deviation.   Labs:  CBC BMET  Recent Labs  Lab 07/15/22 1154 07/15/22 1203  WBC 13.4*  --   HGB 14.3 14.6  HCT 41.9 43.0  PLT 244  --    Recent Labs  Lab 07/15/22 1154 07/15/22 1203  NA 141 141  K 4.3 4.4  CL 105 110  CO2 18*  --   BUN 16 21*  CREATININE 1.11 1.10  GLUCOSE 62* 64*  CALCIUM 8.8*  --     Pertinent additional labs UA negative for infection. UDS positive for THC   EKG: normal sinus rhythm, no ST-elevations  Imaging Studies Performed:  CT head code Stroke:  IMPRESSION: 1. No hemorrhage or CT evidence of an acute infarct. 2. Aspects is 10.  CTA Head:  IMPRESSION: 1. No emergent large vessel occlusion or proximal hemodynamically significant stenosis on the  CTA. 2. No evidence of core infarct or penumbra on CT perfusion. 3. No evidence of acute fracture or traumatic malalignment on the CT cervical spine.  Darci Current, DO 07/15/2022, 4:02 PM PGY-1, [place] [facility] pager: [phone], text pages welcome Secure chat group [place]

## 2022-07-15 NOTE — ED Triage Notes (Signed)
Pt arrives to ER via EMS, code stroke initiated, neuro team at bedside.  Pt last known well 8AM, family also reports that pt was drinking last night, stumbling around and may have fallen, but was his normal this AM

## 2022-07-15 NOTE — Procedures (Signed)
Patient Name: Scott Glenn  MRN: [id number]  Epilepsy Attending: Lora Havens  Referring Physician/Provider: Otelia Santee, NP  Date: 07/15/2022 Duration: 34.36 mins  Patient history: 52 y.o. male with PMH significant for  has a past medical history of Migraines who presents as a CODE STROKE BIB EMS with left sided weakness and trouble speaking.  EEG to evaluate for seizure  Level of alertness: Awake, asleep  AEDs during EEG study: None  Technical aspects: This EEG study was done with scalp electrodes positioned according to the 10-20 International system of electrode placement. Electrical activity was reviewed with band pass filter of 1-'70Hz'$ , sensitivity of 7 uV/mm, display speed of 7m/sec with a '60Hz'$  notched filter applied as appropriate. EEG data were recorded continuously and digitally stored.  Video monitoring was available and reviewed as appropriate.  Description: The posterior dominant rhythm consists of 10 Hz activity of moderate voltage (25-35 uV) seen predominantly in posterior head regions, symmetric and reactive to eye opening and eye closing. Sleep was characterized by vertex waves, sleep spindles (12 to 14 Hz), maximal frontocentral region. Physiologic photic driving was not seen during photic stimulation.  Hyperventilation was not performed.     IMPRESSION: This study is within normal limits. No seizures or epileptiform discharges were seen throughout the recording.  [name] [name]

## 2022-07-15 NOTE — Code Documentation (Signed)
Stroke Response Nurse Documentation Code Documentation  Scott Glenn is a 52 y.o. male arriving to [facility]  via [facility] on 07/15/2022. On No antithrombotic. Code stroke was activated by EMS.   Patient from home where he was LKW at 0800 and now complaining of Left sided weakness and not speaking. Per family, patient was drinking last night and had an unwitnessed fall at some point in the night. This morning woke up speaking normally and family noticed left sided weakness and not speaking around 0800.   Stroke team at the bedside on patient arrival. Labs drawn and patient cleared for CT by Dr. Gilford Raid. Patient to CT with team. NIHSS 18, see documentation for details and code stroke times. Patient with left facial droop, left arm weakness, left leg weakness, left decreased sensation, Expressive aphasia , and dysarthria  on exam. The following imaging was completed:  CT Head, CTA, and CTP. Patient is not a candidate for IV Thrombolytic due to recent head trauma. Patient is not not a candidate for IR due to no LVO noted on imaging per MD.   Care Plan: q2hr NIHSS/VS x12.   Bedside handoff with ED RN Anderson Malta.    Ralene Cork  Stroke Response RN

## 2022-07-16 DIAGNOSIS — W2203XA Walked into furniture, initial encounter: Secondary | ICD-10-CM | POA: Diagnosis present

## 2022-07-16 DIAGNOSIS — F1210 Cannabis abuse, uncomplicated: Secondary | ICD-10-CM | POA: Diagnosis present

## 2022-07-16 DIAGNOSIS — Z91030 Bee allergy status: Secondary | ICD-10-CM | POA: Diagnosis not present

## 2022-07-16 DIAGNOSIS — Z823 Family history of stroke: Secondary | ICD-10-CM | POA: Diagnosis not present

## 2022-07-16 DIAGNOSIS — R531 Weakness: Secondary | ICD-10-CM | POA: Diagnosis present

## 2022-07-16 DIAGNOSIS — Z833 Family history of diabetes mellitus: Secondary | ICD-10-CM | POA: Diagnosis not present

## 2022-07-16 DIAGNOSIS — Z8673 Personal history of transient ischemic attack (TIA), and cerebral infarction without residual deficits: Secondary | ICD-10-CM | POA: Diagnosis not present

## 2022-07-16 DIAGNOSIS — R4701 Aphasia: Secondary | ICD-10-CM | POA: Diagnosis present

## 2022-07-16 DIAGNOSIS — Z66 Do not resuscitate: Secondary | ICD-10-CM | POA: Diagnosis present

## 2022-07-16 DIAGNOSIS — Z23 Encounter for immunization: Secondary | ICD-10-CM | POA: Diagnosis not present

## 2022-07-16 DIAGNOSIS — F1910 Other psychoactive substance abuse, uncomplicated: Secondary | ICD-10-CM | POA: Diagnosis not present

## 2022-07-16 DIAGNOSIS — R29818 Other symptoms and signs involving the nervous system: Secondary | ICD-10-CM | POA: Diagnosis present

## 2022-07-16 DIAGNOSIS — Y929 Unspecified place or not applicable: Secondary | ICD-10-CM | POA: Diagnosis not present

## 2022-07-16 DIAGNOSIS — F449 Dissociative and conversion disorder, unspecified: Secondary | ICD-10-CM | POA: Diagnosis present

## 2022-07-16 DIAGNOSIS — J302 Other seasonal allergic rhinitis: Secondary | ICD-10-CM | POA: Diagnosis present

## 2022-07-16 DIAGNOSIS — R130 Aphagia: Secondary | ICD-10-CM | POA: Diagnosis present

## 2022-07-16 DIAGNOSIS — G8194 Hemiplegia, unspecified affecting left nondominant side: Secondary | ICD-10-CM | POA: Diagnosis present

## 2022-07-16 DIAGNOSIS — Z8249 Family history of ischemic heart disease and other diseases of the circulatory system: Secondary | ICD-10-CM | POA: Diagnosis not present

## 2022-07-16 DIAGNOSIS — Z91018 Allergy to other foods: Secondary | ICD-10-CM | POA: Diagnosis not present

## 2022-07-16 DIAGNOSIS — Z1152 Encounter for screening for COVID-19: Secondary | ICD-10-CM | POA: Diagnosis not present

## 2022-07-16 DIAGNOSIS — Z91014 Allergy to mammalian meats: Secondary | ICD-10-CM | POA: Diagnosis not present

## 2022-07-16 LAB — CBC
HCT: 37.9 % — ABNORMAL LOW (ref 39.0–52.0)
Hemoglobin: 12.6 g/dL — ABNORMAL LOW (ref 13.0–17.0)
MCH: 31.1 pg (ref 26.0–34.0)
MCHC: 33.2 g/dL (ref 30.0–36.0)
MCV: 93.6 fL (ref 80.0–100.0)
Platelets: 226 10*3/uL (ref 150–400)
RBC: 4.05 MIL/uL — ABNORMAL LOW (ref 4.22–5.81)
RDW: 11.9 % (ref 11.5–15.5)
WBC: 7.3 10*3/uL (ref 4.0–10.5)
nRBC: 0 % (ref 0.0–0.2)

## 2022-07-16 LAB — BASIC METABOLIC PANEL
Anion gap: 13 (ref 5–15)
BUN: 16 mg/dL (ref 6–20)
CO2: 21 mmol/L — ABNORMAL LOW (ref 22–32)
Calcium: 8.5 mg/dL — ABNORMAL LOW (ref 8.9–10.3)
Chloride: 103 mmol/L (ref 98–111)
Creatinine, Ser: 1.13 mg/dL (ref 0.61–1.24)
GFR, Estimated: >60 mL/min (ref >60)
Glucose, Bld: 60 mg/dL — ABNORMAL LOW (ref 70–99)
Potassium: 3.7 mmol/L (ref 3.5–5.1)
Sodium: 137 mmol/L (ref 135–145)

## 2022-07-16 NOTE — Consult Note (Addendum)
  Psych consult was placed for see Diagnosis of conversion disorder.  Per neurology suspect conversion disorder.     In terms of conversion disorder or psychogenic condition; most current guidelines for treatment are outpatient cognitive behavioral therapy, relaxation therapy, and or hypnosis.  In the event patient does have underlying psychiatric conditions, will recommend outpatient psychological evaluation, and collectively a treatment plan that both can be agreed on.  Patient will benefit from antidepressant use in an outpatient setting, where he can be managed and watch closely for stabilization.   In most patients, conversion disorder tends to be self-limiting.  Most prognosis are associated with sudden onset, and or stressors.  The sooner patient seeks outpatient treatment, the better prognosis.  Historically confronting patients about the psychological nature of the symptoms i.e. psychogenic, can usually make them worse as most patients do not realize underlying stressors play a part in current symptoms.  Ongoing supportive psychotherapy, focused on coping with underlying conflicts and stress, can help bring about a resolution to conversion disorder.    Will update AVS to reflect outpatient services at [facility]; where patient can receive medication management, and therapy if he chooses.   Patient has been provided resources for outpatient psychotherapy, which is now reflected in AVS and discharge instructions.   -Psychiatry will DC consult at this time.

## 2022-07-16 NOTE — Discharge Instructions (Addendum)
[place] Walk-in information:  Please note, all walk-ins are first come & first serve, with limited number of availability. Therapist for therapy:  Monday & Wednesdays: Please ARRIVE at 7:15 AM for registration Will START at 8:00 AM Every 1st & 2nd Friday of the month: Please ARRIVE at 10:15 AM for registration Will START at 1 PM - 5 PM Psychiatrist for medication management: Monday - Friday:  Please ARRIVE at 7:15 AM for registration Will START at 8:00 AM       Regretfully, due to limited availability, please be aware that you may not been seen on the same day as walk-in. Please consider making an appoint or try again. Thank you for your patience and understanding.  [facility] [place], [place] [address] [phone]  New patients are seen at their walk-in clinic. Walk-in hours are Monday - Friday from 8:30 am - 12:00 pm, and from 1:00 pm - 2:30 pm.   Walk-in patients are seen on a first come, first served basis, so try to arrive as early as possible for the best chance of being seen the same day.

## 2022-07-16 NOTE — Progress Notes (Signed)
Consulted psychiatry per neurology's recommendations. Per psychiatry they are refusing the consult inpatient and recommending outpatient follow up with PT/OT inpatient.   Darci Current, DO [facility], PGY-1 07/16/22 1:07 PM

## 2022-07-16 NOTE — Evaluation (Signed)
Physical Therapy Evaluation Patient Details Name: Scott Glenn MRN: [id number] DOB: 01-Dec-1970 Today's Date: 07/16/2022  History of Present Illness  Patient is a 52 y/o male who presents on 1/11 with left sided weakness and difficulty speaking. NIH:18. Head CT, MRI and EEG-unremarkable. Some concern for underlying functional etiology to his presentation given he was noted to raise his Lft arm and Lft leg up to assist with changing into hospital gown in ED. PMH includes migraines, chicken pox, asthma.  Clinical Impression  Patient presents with left sided weakness, speech deficits, impaired sensation, impaired balance and impaired mobility s/p above. Pt is independent, lives at home with his wife and children and works full time PTA. Today, pt noted to have inconsistencies in LUE/LLE strength during formal testing vs functionally. Requires Mod A of 2 for transfers and gait training due to bil knees buckling, difficulty advancing LLE and Left hand inconsistently falling off walker handle when not supported. Pt mostly nonverbal but able to state name and sister's name in a stuttering and low toned volume, mostly writing and signing to communicate. Hoping pt will progress well enough to return home in a few days as he is very motivated to return to [facility]. Will need a few more days in the hospital to improve mobility. Will follow acutely to maximize independence and mobility prior to return home. If not, may require AIR stay.      Recommendations for follow up therapy are one component of a multi-disciplinary discharge planning process, led by the attending physician.  Recommendations may be updated based on patient status, additional functional criteria and insurance authorization.  Follow Up Recommendations Home health PT      Assistance Recommended at Discharge Frequent or constant Supervision/Assistance  Patient can return home with the following  Two people to help with walking and/or  transfers;A lot of help with bathing/dressing/bathroom;Assistance with cooking/housework;Assist for transportation;Help with stairs or ramp for entrance    Equipment Recommendations Rolling walker (2 wheels)  Recommendations for Other Services       Functional Status Assessment Patient has had a recent decline in their functional status and demonstrates the ability to make significant improvements in function in a reasonable and predictable amount of time.     Precautions / Restrictions Precautions Precautions: Fall Restrictions Weight Bearing Restrictions: No      Mobility  Bed Mobility Overal bed mobility: Needs Assistance Bed Mobility: Supine to Sit, Sit to Supine     Supine to sit: Min assist Sit to supine: Min assist   General bed mobility comments: Observed using LUE to remove covers and pull down gown prior to mobilizing; initiating movement of LLE to EOB but then demonstrates difficulty, cues to use RLE to hook under LLE to bring back into bed. Able to move voluntarily functionally for bed mobility but no consistently.    Transfers Overall transfer level: Needs assistance Equipment used: Rolling walker (2 wheels) Transfers: Sit to/from Stand Sit to Stand: Mod assist, +2 physical assistance, +2 safety/equipment           General transfer comment: Mod A to power to standing with cues for hand placement/technique, observed using LUE to push through arm rest on chair without buckling and then goes limp once standing, needs assist to place left hand on walker handle. Keeping LLE in extension. Stood from [employer], from chair x1.    Ambulation/Gait Ambulation/Gait assistance: Mod assist, +2 safety/equipment, +2 physical assistance Gait Distance (Feet): 8 Feet (+ 8' + 8') Assistive device:  Rolling walker (2 wheels) Gait Pattern/deviations: Step-to pattern, Step-through pattern, Narrow base of support, Trunk flexed, Decreased weight shift to left, Decreased step length -  right, Decreased step length - left, Decreased dorsiflexion - left, Knees buckling Gait velocity: decreased     General Gait Details: Slow, unsteady gait with narrow boS and assist to keep left hand on walker handle for the most part but when support is removed, keeps hand there towards end of session. 2 seated rest breaks. Noted to have bil knees buckling with difficulty advancing LLE at times but inconsistent. Whole body buckling at times through bil hips/trunk but able to catch self  Stairs            Wheelchair Mobility    Modified Rankin (Stroke Patients Only) Modified Rankin (Stroke Patients Only) Pre-Morbid Rankin Score: No symptoms Modified Rankin: Moderately severe disability     Balance Overall balance assessment: Needs assistance Sitting-balance support: Feet supported, No upper extremity supported Sitting balance-Leahy Scale: Fair Sitting balance - Comments: ASsist to donn socks sitting EOB using RUE only, posterior lean at times, Min guard-Min A Postural control: Posterior lean Standing balance support: Bilateral upper extremity supported Standing balance-Leahy Scale: Poor Standing balance comment: Able to stand statically with UE support, needs external support for walking of 2.                             Pertinent Vitals/Pain Pain Assessment Pain Assessment: Faces Faces Pain Scale: Hurts a little bit Pain Location: head Pain Descriptors / Indicators: Headache Pain Intervention(s): Monitored during session    Home Living Family/patient expects to be discharged to:: Private residence Living Arrangements: Spouse/significant other;Children Available Help at Discharge: Family Type of Home: House Home Access: Level entry       Home Layout: One level Home Equipment: None      Prior Function Prior Level of Function : Independent/Modified Independent             Mobility Comments: independent, drives. 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[employer] ADLs Comments: independent     Hand Dominance   Dominant Hand: Right    Extremity/Trunk Assessment   Upper Extremity Assessment Upper Extremity Assessment: LUE deficits/detail;Defer to OT evaluation LUE Deficits / Details: observed using LUE functionally to remove covers and pull down gown in beginning session, inconsistent results of MMT/AROM testing formally sitting EOB    Lower Extremity Assessment Lower Extremity Assessment: Generalized weakness;LLE deficits/detail LLE Deficits / Details: Slight/trace movement to command but otherwise able to lock out LE into extension in walking and use it functionally, inconsistencies noted with formal testing vs functional tasks and overall movement. LLE Sensation: decreased light touch    Cervical / Trunk Assessment Cervical / Trunk Assessment: Normal  Communication   Communication: Other (comment) (non verbal, writing everything down.)  Cognition Arousal/Alertness: Awake/alert Behavior During Therapy: WFL for tasks assessed/performed Overall Cognitive Status: Difficult to assess                                 General Comments: Pt non verbal, writing to communicate or using sign language. When attempting to speak, stuttering words in very low tone. Able to state "Cristopher Peru" which is his sister's name in the room and "Clifton James" when asked        General Comments General comments (skin integrity, edema, etc.): Sister present during session.  Exercises     Assessment/Plan    PT Assessment Patient needs continued PT services  PT Problem List Decreased strength;Decreased range of motion;Decreased mobility;Impaired tone;Decreased coordination;Impaired sensation;Decreased balance;Decreased knowledge of use of DME       PT Treatment Interventions Therapeutic activities;Gait training;Therapeutic exercise;Patient/family education;Stair training;Functional mobility training;Balance training;Neuromuscular  re-education;DME instruction    PT Goals (Current goals can be found in the Care Plan section)  Acute Rehab PT Goals Patient Stated Goal: unable to state, motivated to walk PT Goal Formulation: With patient Time For Goal Achievement: 07/30/22 Potential to Achieve Goals: Fair    Frequency Min 4X/week     Co-evaluation               AM-PAC PT "6 Clicks" Mobility  Outcome Measure Help needed turning from your back to your side while in a flat bed without using bedrails?: A Little Help needed moving from lying on your back to sitting on the side of a flat bed without using bedrails?: A Little Help needed moving to and from a bed to a chair (including a wheelchair)?: A Lot Help needed standing up from a chair using your arms (e.g., wheelchair or bedside chair)?: A Lot Help needed to walk in hospital room?: Total Help needed climbing 3-5 steps with a railing? : Total 6 Click Score: 12    End of Session Equipment Utilized During Treatment: Gait belt Activity Tolerance: Patient tolerated treatment well Patient left: in bed;with call bell/phone within reach;with family/visitor present Nurse Communication: Mobility status PT Visit Diagnosis: Hemiplegia and hemiparesis;Difficulty in walking, not elsewhere classified (R26.2);Unsteadiness on feet (R26.81) Hemiplegia - Right/Left: Left Hemiplegia - dominant/non-dominant: Non-dominant    Time: 1055-1130 PT Time Calculation (min) (ACUTE ONLY): 35 min   Charges:   PT Evaluation $PT Eval Moderate Complexity: 1 Mod          Marisa Severin, PT, DPT Acute Rehabilitation Services Secure chat preferred [place] 07/16/2022, 1:15 PM

## 2022-07-16 NOTE — Evaluation (Signed)
Clinical/Bedside Swallow Evaluation Patient Details  Name: Scott Glenn MRN: [id number] Date of Birth: [birth year]  Today's Date: 07/16/2022 Time: SLP Start Time (ACUTE ONLY): 1010 SLP Stop Time (ACUTE ONLY): 1026 SLP Time Calculation (min) (ACUTE ONLY): 16 min  Past Medical History:  Past Medical History:  Diagnosis Date   Asthma    Chicken pox    Migraines    Seasonal allergies    Past Surgical History: History reviewed. No pertinent surgical history. HPI:  Pt is a 52 y.o. male who presented with hemiparesis and aphasia. Head CT and MRI (1/11) neg for acute findings. EEG (1/11) was WNL. Differential dx and w/u ongoing. Passed [facility] 1/11. Pertinent PMH/PSH includes asthma and migraines.    Assessment / Plan / Recommendation  Clinical Impression  Pt presents with functional oropharyngeal dysphagia at bedside. He did not verbally communicate during eval, with primary mode of expression via writing and head nod/shake to y/n questions. Slight L facial asymmetry, reduced ROM and sensation noted during oral mechanisam examination, otherwise WFL. He self-fed thin liquids by consecutive straw sips, bites of pudding and graham cracker without clincial signs of aspiration. Mastication/oral prep of solids was swift and oral clearance full. Recommend continue regular diet/thin liquids with adherence to universal swallow precautions. No SLP f/u indicated for swallow function at this time. Will discuss with care team as to if a cog/speech eval is warranted prior to d/c.  SLP Visit Diagnosis: Dysphagia, unspecified (R13.10)    Aspiration Risk       Diet Recommendation Regular;Thin liquid   Liquid Administration via: Cup;Straw Medication Administration: Whole meds with liquid Supervision: Patient able to self feed Compensations: Minimize environmental distractions;Slow rate;Small sips/bites Postural Changes: Seated upright at 90 degrees    Other  Recommendations Oral Care Recommendations:  Oral care BID    Recommendations for follow up therapy are one component of a multi-disciplinary discharge planning process, led by the attending physician.  Recommendations may be updated based on patient status, additional functional criteria and insurance authorization.  Follow up Recommendations No SLP follow up      Assistance Recommended at Discharge    Functional Status Assessment Patient has not had a recent decline in their functional status  Frequency and Duration            Prognosis        Swallow Study   General Date of Onset: 07/15/22 HPI: Pt is a 52 y.o. male who presented with hemiparesis and aphasia. Head CT and MRI (1/11) neg for acute findings. EEG (1/11) was WNL. Differential dx and w/u ongoing. Passed [facility] 1/11. Pertinent PMH/PSH includes asthma and migraines. Type of Study: Bedside Swallow Evaluation Previous Swallow Assessment: none per EMR Diet Prior to this Study: Regular;Thin liquids Temperature Spikes Noted: No Respiratory Status: Room air History of Recent Intubation: No Behavior/Cognition: Alert;Cooperative;Pleasant mood Oral Cavity Assessment: Within Functional Limits Oral Care Completed by SLP: No Oral Cavity - Dentition: Adequate natural dentition Vision: Functional for self-feeding Self-Feeding Abilities: Able to feed self;Needs set up Patient Positioning: Upright in bed;Postural control adequate for testing Baseline Vocal Quality: Low vocal intensity Volitional Cough: Strong Volitional Swallow: Able to elicit    Oral/Motor/Sensory Function Overall Oral Motor/Sensory Function: Mild impairment Facial ROM: Reduced left Facial Symmetry: Abnormal symmetry left Facial Sensation: Reduced left Lingual ROM: Within Functional Limits Lingual Symmetry: Within Functional Limits Lingual Strength: Within Functional Limits Velum: Within Functional Limits Mandible: Within Functional Limits   Ice Chips Ice chips: Not tested   Thin Liquid  Thin Liquid:  Within functional limits Presentation: Self Fed;Straw    Nectar Thick Nectar Thick Liquid: Not tested   Honey Thick Honey Thick Liquid: Not tested   Puree Puree: Within functional limits Presentation: Self Fed;Spoon   Solid     Solid: Within functional limits Presentation: [place], [place], [place] Office Number: [phone]  Acie Fredrickson 07/16/2022,10:53 AM

## 2022-07-16 NOTE — Evaluation (Signed)
Occupational Therapy Evaluation Patient Details Name: Scott Glenn MRN: [id number] DOB: 1970-09-02 Today's Date: 07/16/2022   History of Present Illness Patient is a 52 y/o male who presents on 1/11 with left sided weakness and difficulty speaking. NIH:18. Head CT, MRI and EEG-unremarkable. Some concern for underlying functional etiology to his presentation given he was noted to raise his Lft arm and Lft leg up to assist with changing into hospital gown in ED. PMH includes migraines, chicken pox, asthma.   Clinical Impression   PTA, pt independent and working. Upon eval, pt presents with L sided weakness, speech deficits, impaired sensation, balance, and mobility. Pt performing UB ADL with min-mod A and LB ADL with up to max A. Pt requiring +2 A to perform functional ambulatory transfers at time of eval due to bil knee buckling and difficulty advancing LLE. L hand also inconsistently falling off walker. Pt predominantly nonverbal and able to verbalize twice during session. MRI and EEG negative, so hopeful for pt to progress well enough over next few days to return home. If not, may require AIR.      Recommendations for follow up therapy are one component of a multi-disciplinary discharge planning process, led by the attending physician.  Recommendations may be updated based on patient status, additional functional criteria and insurance authorization.   Follow Up Recommendations  Home health OT     Assistance Recommended at Discharge Intermittent Supervision/Assistance  Patient can return home with the following Two people to help with walking and/or transfers;A lot of help with bathing/dressing/bathroom;Assistance with cooking/housework;Assist for transportation;Help with stairs or ramp for entrance;Assistance with feeding    Functional Status Assessment  Patient has had a recent decline in their functional status and demonstrates the ability to make significant improvements in function  in a reasonable and predictable amount of time.  Equipment Recommendations  Other (comment) (TBD pending progress)    Recommendations for Other Services       Precautions / Restrictions Precautions Precautions: Fall Restrictions Weight Bearing Restrictions: No      Mobility Bed Mobility Overal bed mobility: Needs Assistance Bed Mobility: Supine to Sit, Sit to Supine     Supine to sit: Min assist Sit to supine: Min assist   General bed mobility comments: Observed using LUE to remove covers and pull down gown prior to mobilizing; initiating movement of LLE to EOB but then demonstrates difficulty, cues to use RLE to hook under LLE to bring back into bed. Able to move voluntarily functionally for bed mobility but not consistently.    Transfers Overall transfer level: Needs assistance Equipment used: Rolling walker (2 wheels) Transfers: Sit to/from Stand Sit to Stand: Mod assist, +2 physical assistance, +2 safety/equipment           General transfer comment: Mod A to power to standing with cues for hand placement/technique, observed using LUE to push through arm rest on chair without buckling and then goes limp once standing, needs assist to place left hand on walker handle. Keeping LLE in extension. Stood from [employer], from chair x1.      Balance Overall balance assessment: Needs assistance Sitting-balance support: Feet supported, No upper extremity supported Sitting balance-Leahy Scale: Fair Sitting balance - Comments: Assist to donn socks sitting EOB using RUE only, posterior lean at times, Min guard-Min A Postural control: Posterior lean Standing balance support: Bilateral upper extremity supported Standing balance-Leahy Scale: Poor Standing balance comment: Able to stand statically with UE support, needs external support for walking of 2.  ADL either performed or assessed with clinical judgement   ADL Overall ADL's : Needs  assistance/impaired Eating/Feeding: Minimal assistance;Sitting Eating/Feeding Details (indicate cue type and reason): fol bil tasks Grooming: Moderate assistance;Sitting Grooming Details (indicate cue type and reason): up to mod A for bil tasks Upper Body Bathing: Minimal assistance;Sitting   Lower Body Bathing: Minimal assistance;Sitting/lateral leans   Upper Body Dressing : Moderate assistance;Cueing for compensatory techniques;Sitting   Lower Body Dressing: Minimal assistance Lower Body Dressing Details (indicate cue type and reason): Min A to achieve and sustain figure 4 with LLE. NO LOB in attempt to achieve figure 4. Educated re compensatory techniques for completing with only RUE at this time. Toilet Transfer: Moderate assistance;+2 for physical assistance;+2 for safety/equipment;Ambulation;Rolling walker (2 wheels);BSC/3in1 Toilet Transfer Details (indicate cue type and reason): Mod A +2 for safety; BLE buckling; needs frequent reminders that RLE is okay and to rely on it         Functional mobility during ADLs: Moderate assistance;+2 for physical assistance;+2 for safety/equipment General ADL Comments: Observed to use BUE to pull down covers and gown.     Vision Baseline Vision/History: 0 No visual deficits Ability to See in Adequate Light: 0 Adequate Patient Visual Report: Blurring of vision Vision Assessment?: No apparent visual deficits Additional Comments: Pt reports some blurring in L eye, but occular ROM WFL. Will continue to assess     Perception     Praxis      Pertinent Vitals/Pain Pain Assessment Pain Assessment: Faces Faces Pain Scale: Hurts a little bit Pain Location: head Pain Descriptors / Indicators: Headache Pain Intervention(s): Limited activity within patient's tolerance, Monitored during session     Hand Dominance Right   Extremity/Trunk Assessment Upper Extremity Assessment Upper Extremity Assessment: LUE deficits/detail LUE Deficits /  Details: observed using LUE functionally to remove covers and pull down gown in beginning session, inconsistent results of MMT/AROM testing formally sitting EOB with 2- MMT overall. Pt additionally observed with inconsistenies in ability to maintain LUE on RW during functional mobility as well as push through LUE LUE Sensation: decreased light touch LUE Coordination: decreased fine motor;decreased gross motor   Lower Extremity Assessment Lower Extremity Assessment: Defer to PT evaluation LLE Deficits / Details: Slight/trace movement to command but otherwise able to lock out LE into extension in walking and use it functionally, inconsistencies noted with formal testing vs functional tasks and overall movement. LLE Sensation: decreased light touch   Cervical / Trunk Assessment Cervical / Trunk Assessment: Normal   Communication Communication Communication: Other (comment) (nonverbal, writing everything down.)   Cognition Arousal/Alertness: Awake/alert Behavior During Therapy: WFL for tasks assessed/performed Overall Cognitive Status: Difficult to assess                                 General Comments: Pt non verbal, writing to communicate or using sign language. When attempting to speak, stuttering words in very low tone. Able to state "Cristopher Peru" which is his sister's name in the room and "Amine" when asked. Following all commands     General Comments  sister present during session    Exercises     Shoulder Instructions      Home Living Family/patient expects to be discharged to:: Private residence Living Arrangements: Spouse/significant other;Children Available Help at Discharge: Family Type of Home: House Home Access: Level entry     Home Layout: One level     Bathroom Shower/Tub: Union unit  Bathroom Toilet: Standard     Home Equipment: None          Prior Functioning/Environment Prior Level of Function : Independent/Modified Independent              Mobility Comments: independent, drives. 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[employer] ADLs Comments: independent        OT Problem List: Decreased strength;Decreased activity tolerance;Impaired balance (sitting and/or standing);Decreased range of motion;Decreased coordination;Decreased cognition;Decreased safety awareness;Decreased knowledge of use of DME or AE;Impaired sensation;Impaired tone;Impaired UE functional use      OT Treatment/Interventions: Self-care/ADL training;Therapeutic exercise;DME and/or AE instruction;Therapeutic activities;Patient/family education;Balance training;Cognitive remediation/compensation;Visual/perceptual remediation/compensation    OT Goals(Current goals can be found in the care plan section) Acute Rehab OT Goals Patient Stated Goal: get better OT Goal Formulation: With patient Time For Goal Achievement: 07/30/22 Potential to Achieve Goals: Good  OT Frequency: Min 2X/week    Co-evaluation PT/OT/SLP Co-Evaluation/Treatment: Yes Reason for Co-Treatment: For patient/therapist safety PT goals addressed during session: Mobility/safety with mobility OT goals addressed during session: ADL's and self-care      AM-PAC OT "6 Clicks" Daily Activity     Outcome Measure Help from another person eating meals?: A Little Help from another person taking care of personal grooming?: A Lot Help from another person toileting, which includes using toliet, bedpan, or urinal?: A Lot Help from another person bathing (including washing, rinsing, drying)?: A Lot Help from another person to put on and taking off regular upper body clothing?: A Lot Help from another person to put on and taking off regular lower body clothing?: A Lot 6 Click Score: 13   End of Session Equipment Utilized During Treatment: Gait belt;Rolling walker (2 wheels) Nurse Communication: Mobility status  Activity Tolerance: Patient tolerated treatment well Patient left: in bed;with  call bell/phone within reach;with bed alarm set;with family/visitor present  OT Visit Diagnosis: Unsteadiness on feet (R26.81);Muscle weakness (generalized) (M62.81);Other abnormalities of gait and mobility (R26.89);Cognitive communication deficit (R41.841);Pain Symptoms and signs involving cognitive functions:  (no acute findings) Pain - part of body:  (headache)                Time: 0459-9774 OT Time Calculation (min): 29 min Charges:  OT General Charges $OT Visit: 1 Visit OT Evaluation $OT Eval Moderate Complexity: 1 Mod  Elder Cyphers, OTR/L [facility] Acute Rehabilitation Office: [phone]   Magnus Ivan 07/16/2022, 1:45 PM

## 2022-07-16 NOTE — Progress Notes (Addendum)
Daily Progress Note Intern Pager: [phone]  Patient name: Scott Glenn Medical record number: [id number] Date of birth: 1970-11-13 Age: 52 y.o. Gender: male  Primary Care Provider: Gildardo Pounds, NP Consultants: Neurology  Code Status: DNI, wants compressions and medications; does not want intubation   Pt Overview and Major Events to Date:  1/11: Admitted   Assessment and Plan:  Scott Glenn is a 52 y.o. male presenting with hemiparesis and aphagia. . Pertinent PMH/PSH includes asthma and migraines.  Unclear etiology at this time but ruled out stroke and seizure with imaging. Patient believes he is improving but exam still significant for severe left sided deficits. CIWAs scored in a low range, dose not appear to be withdrawing from alcohol.  Presentation seems to be emotionally provoked as he just received news about a close friend dying prior to the onset of his symptoms.  Still considering complex/hemiplegic migraine as part of the differential with history of migraines but would have expected symptoms to be fully resolved by now. Considering cerebral sinus thrombosis but less likely as he is not having seizures or signs of intercranial hypertension.    Acute focal neurological deficit Unclear etiology.  Consider workup for cerebral venous thromboembolism or complex migraines. MRI negative for acute stroke, EEG negative for seizures. - Neurology following, appreciate recommendations  - continuous cardiac monitoring  - s/p Keppra load  - PT/OT to treat - SLP consulted - DC fluids, is now taking PO - Fall precautions - seizure precautions  - cont CIWAs for 24 hrs  Substance abuse ([place]) UDS with positive THC  - TOC consult for substance abuse  - CIWA q2h for alcohol withdrawal    FEN/GI: regular diet  PPx: Lovenox Dispo:Pending PT recommendations  pending clinical improvement . Barriers include ongoing medical workup.   Subjective:  Feeling better this  morning. Still using sign language to communicate. Endorses a headache that seems to be related to his fall on 1/10. Explained to patient PT will need to come evaluate him as he is not safe to go home as he is unable to use his left side. Patient in agreement with plan.   Objective: Temp:  [98.2 F (36.8 C)-98.7 F (37.1 C)] 98.5 F (36.9 C) (01/12 0829) Pulse Rate:  [63-109] 63 (01/12 0829) Resp:  [14-22] 16 (01/12 0829) BP: (115-149)/(80-95) 134/87 (01/12 0829) SpO2:  [95 %-100 %] 99 % (01/12 0829) Weight:  [72.6 kg-75 kg] 72.9 kg (01/11 2230) Physical Exam: Well-appearing, no acute distress Cardio: Regular rate, regular rhythm, no murmurs on exam. Pulm: Clear, no wheezing, no crackles. No increased work of breathing Abdominal: bowel sounds present, soft, non-tender, non-distended Extremities: no peripheral edema  Neuro: alert, non-verbal, no facial asymmetry, strength intact on the right, unable to move left UE and LE, is able to twitch his fingers on the left and wiggle his toes which is improved from admission, pupils equal and reactive to light.    Laboratory: Most recent CBC Lab Results  Component Value Date   WBC 7.3 07/16/2022   HGB 12.6 (L) 07/16/2022   HCT 37.9 (L) 07/16/2022   MCV 93.6 07/16/2022   PLT 226 07/16/2022   Most recent BMP    Latest Ref Rng & Units 07/16/2022    5:10 AM  BMP  Glucose 70 - 99 mg/dL 60   BUN 6 - 20 mg/dL 16   Creatinine 0.61 - 1.24 mg/dL 1.13   Sodium 135 - 145 mmol/L 137  Potassium 3.5 - 5.1 mmol/L 3.7   Chloride 98 - 111 mmol/L 103   CO2 22 - 32 mmol/L 21   Calcium 8.9 - 10.3 mg/dL 8.5     Imaging/Diagnostic Tests: MRI negative for acute stroke EEG: negative   Darci Current, DO 07/16/2022, 8:37 AM  PGY-1, [place] Intern pager: [phone], text pages welcome Secure chat group [place]

## 2022-07-16 NOTE — Progress Notes (Signed)
Neurology Progress Note   S:// No family at the bedside. Patient is sitting up in bed eating breakfast. Patient  Continues to use sign language to communicate, he  has attempted to state his name and age however speech is very pressured and never stated either. He is tearful while I am in the room. He nods his head when asked if his left side weakness is improving.  Informed patient that MRI brain did not show any acute abnormalities and no stroke, and EEG results were normal with no seizures   O:// Current vital signs: BP 134/87 (BP Location: Left Arm)   Pulse 63   Temp 98.5 F (36.9 C) (Oral)   Resp 16   Ht '5\' 9"'$  (1.753 m)   Wt 72.9 kg   SpO2 99%   BMI 23.73 kg/m  Vital signs in last 24 hours: Temp:  [98.2 F (36.8 C)-98.7 F (37.1 C)] 98.5 F (36.9 C) (01/12 0829) Pulse Rate:  [63-109] 63 (01/12 0829) Resp:  [14-22] 16 (01/12 0829) BP: (115-149)/(80-95) 134/87 (01/12 0829) SpO2:  [95 %-100 %] 99 % (01/12 0829) Weight:  [72.6 kg-75 kg] 72.9 kg (01/11 2230)  GENERAL: Awake, alert in NAD. He is tearful  HEENT: - Normocephalic and atraumatic, dry mm LUNGS - Clear to auscultation bilaterally with no wheezes CV - S1S2 RRR, no m/r/g, equal pulses bilaterally. ABDOMEN - Soft, nontender, nondistended with normoactive BS Ext: warm, well perfused, intact peripheral pulses, no edema  NEURO:  Mental Status: AA&Ox3 he is typing answers out. Still using sign language Language: No verbal speech.  Cranial Nerves: PERRL. EOMI, visual fields full, no facial asymmetry, facial sensation intact, hearing intact, tongue/uvula/soft palate midline, normal sternocleidomastoid and trapezius muscle strength. No evidence of tongue atrophy or fibrillations Motor: left arm- can wiggle fingers and give squeeze; when asked to lift arm he can't, when gravity eliminated can take finger and touch his nose. When distracted is able to lift left arm antigravity. Right arm 5/5. Left leg can wiggle toes and bend  knee and lift leg slightly off the bed but drops quickly back  Tone: is normal and bulk is normal Sensation- decreased on left  Coordination: FTN intact bilaterally, no ataxia in BLE. Gait- deferred   Medications  Current Facility-Administered Medications:    acetaminophen (TYLENOL) tablet 650 mg, 650 mg, Oral, Q6H PRN, 650 mg at 07/16/22 0905 **OR** acetaminophen (TYLENOL) suppository 650 mg, 650 mg, Rectal, Q6H PRN, Darci Current, DO   enoxaparin (LOVENOX) injection 40 mg, 40 mg, Subcutaneous, Q24H, Darci Current, DO, 40 mg at 09/98/33 8250   folic acid (FOLVITE) tablet 1 mg, 1 mg, Oral, Daily, Darci Current, DO, 1 mg at 07/16/22 0806   influenza vac split quadrivalent PF (FLUARIX) injection 0.5 mL, 0.5 mL, Intramuscular, Tomorrow-1000, Chambliss, Marshall L, MD   ipratropium-albuterol (DUONEB) 0.5-2.5 (3) MG/3ML nebulizer solution 3 mL, 3 mL, Nebulization, Once, Darci Current, DO   multivitamin with minerals tablet 1 tablet, 1 tablet, Oral, Daily, Darci Current, DO, 1 tablet at 07/16/22 0806   ondansetron (ZOFRAN) tablet 4 mg, 4 mg, Oral, Q6H PRN **OR** ondansetron (ZOFRAN) injection 4 mg, 4 mg, Intravenous, Q6H PRN, Darci Current, DO   pneumococcal 20-valent conjugate vaccine (PREVNAR 20) injection 0.5 mL, 0.5 mL, Intramuscular, Tomorrow-1000, Chambliss, Marshall L, MD   polyethylene glycol (MIRALAX / GLYCOLAX) packet 17 g, 17 g, Oral, Daily PRN, Darci Current, DO   thiamine (VITAMIN B1) tablet 100 mg, 100 mg, Oral, Daily, 100 mg at 07/16/22 0806 **OR** thiamine (  VITAMIN B1) injection 100 mg, 100 mg, Intravenous, Daily, Darci Current, DO Labs CBC    Component Value Date/Time   WBC 7.3 07/16/2022 0510   RBC 4.05 (L) 07/16/2022 0510   HGB 12.6 (L) 07/16/2022 0510   HGB 14.2 12/02/2021 1509   HCT 37.9 (L) 07/16/2022 0510   HCT 42.2 12/02/2021 1509   PLT 226 07/16/2022 0510   PLT 214 12/02/2021 1509   MCV 93.6 07/16/2022 0510   MCV 91 12/02/2021 1509   MCH 31.1 07/16/2022 0510    MCHC 33.2 07/16/2022 0510   RDW 11.9 07/16/2022 0510   RDW 11.8 12/02/2021 1509   LYMPHSABS 0.6 (L) 07/15/2022 1154   MONOABS 0.6 07/15/2022 1154   EOSABS 0.0 07/15/2022 1154   BASOSABS 0.1 07/15/2022 1154    CMP     Component Value Date/Time   NA 137 07/16/2022 0510   NA 142 12/02/2021 1509   K 3.7 07/16/2022 0510   CL 103 07/16/2022 0510   CO2 21 (L) 07/16/2022 0510   GLUCOSE 60 (L) 07/16/2022 0510   BUN 16 07/16/2022 0510   BUN 16 12/02/2021 1509   CREATININE 1.13 07/16/2022 0510   CALCIUM 8.5 (L) 07/16/2022 0510   PROT 7.2 07/15/2022 1154   PROT 7.1 12/02/2021 1509   ALBUMIN 4.8 07/15/2022 1154   ALBUMIN 4.9 12/02/2021 1509   AST 73 (H) 07/15/2022 1154   ALT 35 07/15/2022 1154   ALKPHOS 55 07/15/2022 1154   BILITOT 1.8 (H) 07/15/2022 1154   BILITOT 2.4 (H) 12/02/2021 1509   GFRNONAA >60 07/16/2022 0510   GFRAA >60 11/14/2019 0631    glycosylated hemoglobin  Lipid Panel     Component Value Date/Time   CHOL 169 12/02/2021 1509   TRIG 142 12/02/2021 1509   HDL 70 12/02/2021 1509   CHOLHDL 2.4 12/02/2021 1509   CHOLHDL 4 12/21/2016 1646   VLDL 42.2 (H) 12/21/2016 1646   LDLCALC 75 12/02/2021 1509   LDLDIRECT 105.0 12/21/2016 1646     Imaging I have reviewed images in epic and the results pertinent to this consultation are:  Code stroke CT-scan of the brain: 1. No hemorrhage or CT evidence of an acute infarct. 2. Aspects is 10.  Code Stroke CTA head and neck with Perfusion: 1. No emergent large vessel occlusion or proximal hemodynamically significant stenosis on the CTA. 2. No evidence of core infarct or penumbra on CT perfusion  MRI examination of the brain 1. No acute intracranial pathology. 2. 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  rEEG 1/11: This study is within normal limits. No seizures or epileptiform discharges were seen throughout the recording.    Assessment:  Scott Glenn is a 52 y.o. male with PMH significant for  has a past medical history of Asthma, Chicken pox, Migraines, and Seasonal allergies. who presents as a CODE STROKE BIB EMS with left sided weakness and trouble speaking. No aphasia since he is able to communicate in sign language and responds to questions appropriately.   Likely conversion disorder   Recommendations: - Continue CIWA protocol  - Recommend psych consult  - Neurology will sign off. Please call with any questions or concerns   Linton Flemings, DNP [facility]

## 2022-07-17 DIAGNOSIS — R531 Weakness: Secondary | ICD-10-CM

## 2022-07-17 DIAGNOSIS — R29818 Other symptoms and signs involving the nervous system: Secondary | ICD-10-CM | POA: Diagnosis not present

## 2022-07-17 NOTE — Progress Notes (Signed)
Physical Therapy Treatment Patient Details Name: Scott Glenn MRN: [id number] DOB: 04-Jun-1971 Today's Date: 07/17/2022   History of Present Illness Patient is a 52 y/o male who presents on 1/11 with left sided weakness and difficulty speaking. NIH:18. Head CT, MRI and EEG-unremarkable. Some concern for underlying functional etiology to his presentation given he was noted to raise his Lft arm and Lft leg up to assist with changing into hospital gown in ED. PMH includes migraines, chicken pox, asthma.    PT Comments    Pt up in bathroom with nursing staff on arrival.  Pt expressed tiredness, but wanted to participate further.  Emphasis on coordinated gait pattern in the RW, standing activity with minimal UE assist or support while pt completed an adl task and improvement of gait during turns and backing up in the confined space of the room.   Recommendations for follow up therapy are one component of a multi-disciplinary discharge planning process, led by the attending physician.  Recommendations may be updated based on patient status, additional functional criteria and insurance authorization.  Follow Up Recommendations  Home health PT     Assistance Recommended at Discharge Frequent or constant Supervision/Assistance  Patient can return home with the following A little help with walking and/or transfers;A little help with bathing/dressing/bathroom;Assistance with cooking/housework;Assist for transportation;Help with stairs or ramp for entrance   Equipment Recommendations  Rolling walker (2 wheels)    Recommendations for Other Services       Precautions / Restrictions Precautions Precautions: Fall Restrictions Weight Bearing Restrictions: No     Mobility  Bed Mobility Overal bed mobility: Needs Assistance Bed Mobility: Sit to Supine       Sit to supine: Min guard   General bed mobility comments: uncoordinated and slow, but no assist needed.    Transfers Overall  transfer level: Needs assistance Equipment used: Rolling walker (2 wheels) Transfers: Sit to/from Stand, Bed to chair/wheelchair/BSC Sit to Stand: Mod assist, +2 safety/equipment           General transfer comment: stand to sit with cues for hand placement/technique and assist to control descent    Ambulation/Gait Ambulation/Gait assistance: Mod assist, +2 safety/equipment, +2 physical assistance Gait Distance (Feet): 8 Feet (to sink and 8 feet to EOB) Assistive device: Rolling walker (2 wheels) Gait Pattern/deviations: Step-to pattern   Gait velocity interpretation: <1.31 ft/sec, indicative of household ambulator   General Gait Details: paretic on L with weak heel/toe pattern, short time in L stance so R LE with short step length.  Generally incoordinated L side.  L hand assisted to stay on the RW.   Stairs             Wheelchair Mobility    Modified Rankin (Stroke Patients Only) Modified Rankin (Stroke Patients Only) Modified Rankin: Moderately severe disability     Balance Overall balance assessment: Needs assistance   Sitting balance-Leahy Scale: Fair     Standing balance support: Bilateral upper extremity supported, During functional activity, Reliant on assistive device for balance Standing balance-Leahy Scale: Poor Standing balance comment: stood at sink doing adl task using 1 UE for support while completing task over 6-8 min time.                            Cognition Arousal/Alertness: Awake/alert Behavior During Therapy: WFL for tasks assessed/performed Overall Cognitive Status:  (NT formally)  Exercises      General Comments General comments (skin integrity, edema, etc.): Sister present during session.    She comments on his improvement      Pertinent Vitals/Pain Pain Assessment Pain Assessment: Faces Faces Pain Scale: Hurts a little bit Pain Location: general L side  pins/needles Pain Intervention(s): Monitored during session    Home Living                          Prior Function            PT Goals (current goals can now be found in the care plan section) Acute Rehab PT Goals Patient Stated Goal: back to my normal PT Goal Formulation: With patient Time For Goal Achievement: 07/30/22 Potential to Achieve Goals: Fair Progress towards PT goals: Progressing toward goals    Frequency    Min 4X/week      PT Plan Current plan remains appropriate    Co-evaluation PT/OT/SLP Co-Evaluation/Treatment: Yes Reason for Co-Treatment: To address functional/ADL transfers PT goals addressed during session: Mobility/safety with mobility        AM-PAC PT "6 Clicks" Mobility   Outcome Measure  Help needed turning from your back to your side while in a flat bed without using bedrails?: A Little Help needed moving from lying on your back to sitting on the side of a flat bed without using bedrails?: A Little Help needed moving to and from a bed to a chair (including a wheelchair)?: A Lot Help needed standing up from a chair using your arms (e.g., wheelchair or bedside chair)?: A Little Help needed to walk in hospital room?: A Lot Help needed climbing 3-5 steps with a railing? : A Lot 6 Click Score: 15    End of Session   Activity Tolerance: Patient tolerated treatment well Patient left: in bed;with call bell/phone within reach;with family/visitor present Nurse Communication: Mobility status PT Visit Diagnosis: Hemiplegia and hemiparesis;Difficulty in walking, not elsewhere classified (R26.2);Unsteadiness on feet (R26.81) Hemiplegia - Right/Left: Left Hemiplegia - dominant/non-dominant: Non-dominant Hemiplegia - caused by: Unspecified     Time: 5498-2641 PT Time Calculation (min) (ACUTE ONLY): 23 min  Charges:  $Gait Training: 8-22 mins                     07/17/2022  Ginger Carne., PT Acute Rehabilitation Services [phone]   (office)   Tessie Fass Roldan [place] 07/17/2022, 2:41 PM

## 2022-07-17 NOTE — Plan of Care (Signed)
  Problem: Education: Goal: Knowledge of General Education information will improve Description: Including pain rating scale, medication(s)/side effects and non-pharmacologic comfort measures Outcome: Progressing   Problem: Clinical Measurements: Goal: Ability to maintain clinical measurements within normal limits will improve Outcome: Progressing Goal: Will remain free from infection Outcome: Progressing Goal: Diagnostic test results will improve Outcome: Progressing Goal: Respiratory complications will improve Outcome: Progressing Goal: Cardiovascular complication will be avoided Outcome: Progressing   Problem: Nutrition: Goal: Adequate nutrition will be maintained Outcome: Progressing   Problem: Elimination: Goal: Will not experience complications related to bowel motility Outcome: Progressing Goal: Will not experience complications related to urinary retention Outcome: Progressing   Problem: Pain Managment: Goal: General experience of comfort will improve Outcome: Progressing   Problem: Safety: Goal: Ability to remain free from injury will improve Outcome: Progressing   Problem: Health Behavior/Discharge Planning: Goal: Ability to manage health-related needs will improve Outcome: Not Progressing   Problem: Activity: Goal: Risk for activity intolerance will decrease Outcome: Not Progressing   Problem: Coping: Goal: Level of anxiety will decrease Outcome: Not Progressing

## 2022-07-17 NOTE — Progress Notes (Addendum)
     Daily Progress Note Intern Pager: [phone]  Patient name: Scott Glenn Medical record number: [id number] Date of birth: Apr 19, 1971 Age: 52 y.o. Gender: male  Primary Care Provider: Gildardo Pounds, NP Consultants: Neurology Code Status: DNI, wants compressions and medications; does not want intubation   Pt Overview and Major Events to Date:  1/11 Admitted  Assessment and Plan: CW is a 52yo M admitted for L hemiparesis and aphagia.   PMHx pertinent for asthma and migraines.  Acute focal neurological deficit Acute onset L hemiparesis and aphagia. Current leading differential is conversion disorder.  So far, workup is negative for seizure or stroke. Per Neuro, low concern at this time for organic etiology of symptoms. Per psych, conversion disorder tends to be self-limiting and have recommended outpt psychotherapy resources.  - Per PT note, recommend continued hospitalization for PT and potential d/c w/ home health vs AIR pending clinical improvement.  - D/c cardiac monitoring - Neurology and Psychiatry signed off. - Fall precautions - seizure precautions  - D/c CIWA (stable >24hrs)  Substance abuse ([facility])-resolved as of 07/17/2022 TOC consulted    FEN/GI: Regular diet PPx: Lovenox Dispo:Home with home health pending clinical improvement . Barriers include physical deficit requiring continued PT.   Subjective:  Pt subjectively feels like he continues to improve. Pt is asking when he can go home.  Objective: Temp:  [97.9 F (36.6 C)-98.7 F (37.1 C)] 98 F (36.7 C) (01/13 0430) Pulse Rate:  [60-73] 60 (01/13 0430) Resp:  [14-16] 16 (01/13 0430) BP: (111-134)/(76-87) 117/84 (01/13 0430) SpO2:  [97 %-100 %] 100 % (01/13 0430) Physical Exam: General: Alert, sitting comfortably in bed. NAD. Communicating with sign language and verbally. Cardiovascular: RRR Respiratory: Normal WOB on RA. CTAB. Abdomen: \Soft, nontender, nondistended. Neuro: Able to move L UE,  loosely form a fist. Able to lift L LE and slightly move. (Overall improved strength compared to my exam 2 nights ago)  Laboratory: Most recent CBC Lab Results  Component Value Date   WBC 7.3 07/16/2022   HGB 12.6 (L) 07/16/2022   HCT 37.9 (L) 07/16/2022   MCV 93.6 07/16/2022   PLT 226 07/16/2022   Most recent BMP    Latest Ref Rng & Units 07/16/2022    5:10 AM  BMP  Glucose 70 - 99 mg/dL 60   BUN 6 - 20 mg/dL 16   Creatinine 0.61 - 1.24 mg/dL 1.13   Sodium 135 - 145 mmol/L 137   Potassium 3.5 - 5.1 mmol/L 3.7   Chloride 98 - 111 mmol/L 103   CO2 22 - 32 mmol/L 21   Calcium 8.9 - 10.3 mg/dL 8.5     Arlyce Dice, MD 07/17/2022, 6:40 AM  PGY-1, [place] Intern pager: [phone], text pages welcome Secure chat group [place]

## 2022-07-17 NOTE — Progress Notes (Signed)
Occupational Therapy Treatment Patient Details Name: Scott Glenn MRN: [id number] DOB: 19-Feb-1971 Today's Date: 07/17/2022   History of present illness Patient is a 52 y/o male who presents on 1/11 with left sided weakness and difficulty speaking. NIH:18. Head CT, MRI and EEG-unremarkable. Some concern for underlying functional etiology to his presentation given he was noted to raise his Lft arm and Lft leg up to assist with changing into hospital gown in ED. PMH includes migraines, chicken pox, asthma.   OT comments  Pt progressing towards goals this session, able to complete 2 standing grooming tasks at sink with min A. Increased time/cues needed to position LUE safely during tasks. Pt min guard A for bed mobility, and min -mod A +2 for transfers/in room ambulation with RW. Pt presenting with impairments listed below, will follow acutely. Continue to recommend HHOT at d/c.   Recommendations for follow up therapy are one component of a multi-disciplinary discharge planning process, led by the attending physician.  Recommendations may be updated based on patient status, additional functional criteria and insurance authorization.    Follow Up Recommendations  Home health OT     Assistance Recommended at Discharge Intermittent Supervision/Assistance  Patient can return home with the following  Two people to help with walking and/or transfers;A lot of help with bathing/dressing/bathroom;Assistance with cooking/housework;Assist for transportation;Help with stairs or ramp for entrance;Assistance with feeding   Equipment Recommendations  BSC/3in1    Recommendations for Other Services PT consult    Precautions / Restrictions Precautions Precautions: Fall Restrictions Weight Bearing Restrictions: No       Mobility Bed Mobility Overal bed mobility: Needs Assistance Bed Mobility: Sit to Supine       Sit to supine: Min guard   General bed mobility comments: uncoordinated and slow,  but no assist needed.    Transfers Overall transfer level: Needs assistance Equipment used: Rolling walker (2 wheels) Transfers: Sit to/from Stand, Bed to chair/wheelchair/BSC Sit to Stand: Mod assist, +2 safety/equipment, Min assist           General transfer comment: stand to sit with cues for hand placement/technique and assist to control descent     Balance Overall balance assessment: Needs assistance Sitting-balance support: Feet supported, No upper extremity supported Sitting balance-Leahy Scale: Fair     Standing balance support: Bilateral upper extremity supported, During functional activity, Reliant on assistive device for balance Standing balance-Leahy Scale: Poor Standing balance comment: stood at sink doing adl task using 1 UE for support while completing task over 6-8 min time.                           ADL either performed or assessed with clinical judgement   ADL Overall ADL's : Needs assistance/impaired     Grooming: Minimal assistance;Standing Grooming Details (indicate cue type and reason): for fine motor aspects of task                 Toilet Transfer: Minimal assistance;Ambulation;Rolling walker (2 wheels);Moderate assistance;+2 for physical assistance           Functional mobility during ADLs: Minimal assistance      Extremity/Trunk Assessment Upper Extremity Assessment Upper Extremity Assessment: LUE deficits/detail LUE Deficits / Details: 2+/5 ROM, weak grasp, difficulty holding RW or coordination use of RUE during mobility LUE Sensation: decreased light touch LUE Coordination: decreased fine motor;decreased gross motor   Lower Extremity Assessment Lower Extremity Assessment: Defer to PT evaluation  Vision   Vision Assessment?: No apparent visual deficits   Perception Perception Perception: Not tested   Praxis Praxis Praxis: Not tested    Cognition Arousal/Alertness: Awake/alert Behavior During Therapy:  WFL for tasks assessed/performed                                            Exercises      Shoulder Instructions       General Comments VSS on RA    Pertinent Vitals/ Pain       Pain Assessment Pain Assessment: Faces Pain Score: 2  Faces Pain Scale: Hurts a little bit Pain Location: general L side pins/needles Pain Intervention(s): Monitored during session  Home Living                                          Prior Functioning/Environment              Frequency  Min 2X/week        Progress Toward Goals  OT Goals(current goals can now be found in the care plan section)  Progress towards OT goals: Progressing toward goals  Acute Rehab OT Goals Patient Stated Goal: none stated OT Goal Formulation: With patient Time For Goal Achievement: 07/30/22 Potential to Achieve Goals: Good ADL Goals Pt Will Perform Grooming: with min guard assist;standing Pt Will Perform Lower Body Dressing: with supervision;sit to/from stand Pt Will Transfer to Toilet: with min guard assist;ambulating;regular height toilet Pt/caregiver will Perform Home Exercise Program: Increased strength;Left upper extremity;With written HEP provided;Independently  Plan Discharge plan remains appropriate;Frequency remains appropriate    Co-evaluation      Reason for Co-Treatment: To address functional/ADL transfers PT goals addressed during session: Mobility/safety with mobility        AM-PAC OT "6 Clicks" Daily Activity     Outcome Measure   Help from another person eating meals?: A Little Help from another person taking care of personal grooming?: A Little Help from another person toileting, which includes using toliet, bedpan, or urinal?: A Little Help from another person bathing (including washing, rinsing, drying)?: A Lot Help from another person to put on and taking off regular upper body clothing?: A Little Help from another person to put on and  taking off regular lower body clothing?: A Lot 6 Click Score: 16    End of Session Equipment Utilized During Treatment: Rolling walker (2 wheels)  OT Visit Diagnosis: Unsteadiness on feet (R26.81);Muscle weakness (generalized) (M62.81);Other abnormalities of gait and mobility (R26.89);Cognitive communication deficit (R41.841);Pain   Activity Tolerance Patient tolerated treatment well   Patient Left in bed;with call bell/phone within reach;with bed alarm set;with family/visitor present   Nurse Communication Mobility status        Time: 0076-2263 OT Time Calculation (min): 23 min  Charges: OT General Charges $OT Visit: 1 Visit OT Treatments $Self Care/Home Management : 8-22 mins  Renaye Rakers, OTD, OTR/L SecureChat Preferred Acute Rehab [phone] - [employer] 07/17/2022, 4:36 PM

## 2022-07-18 NOTE — Progress Notes (Signed)
     Daily Progress Note Intern Pager: [phone]  Patient name: Scott Glenn Medical record number: [id number] Date of birth: [birth year] Age: 52 y.o. Gender: male  Primary Care Provider: Gildardo Pounds, NP Consultants: Neurology Code Status: DNI, wants compressions and medications; does not want intubation   Pt Overview and Major Events to Date:  1/11: Admitted   Assessment and Plan:  CW is a 52yo M admitted for L hemiparesis and aphagia. PMHx pertinent for asthma and migraines.  Acute focal neurological deficit Improving. Motivated to go home.  - Per PT note, recommend continued hospitalization for PT and potential d/c w/ home health vs AIR pending clinical improvement.  - Fall precautions - seizure precautions   Substance abuse (HCC)-resolved as of 07/17/2022 TOC consulted   FEN/GI: regular PPx: Lovenox  Dispo:Pending PT recommendations  pending clinical improvement . Marland Kitchen   Subjective:  Patient improving motivated to return home, understands he will need to be cleared by PT for a safe dispo.   Objective: Temp:  [97.9 F (36.6 C)-98.6 F (37 C)] 98.4 F (36.9 C) (01/14 0828) Pulse Rate:  [55-66] 66 (01/14 0828) Resp:  [14-18] 18 (01/14 0828) BP: (112-128)/(74-96) 117/83 (01/14 0828) SpO2:  [97 %-100 %] 100 % (01/14 0828) Physical Exam: Well-appearing, no acute distress Cardio: Regular rate, regular rhythm, no murmurs on exam. Pulm: Clear, no wheezing, no crackles. No increased work of breathing Abdominal: bowel sounds present, soft, non-tender, non-distended Extremities: no peripheral edema  Neuro: alert and oriented x3, speech normal in content, no facial asymmetry, strength intact and equal bilaterally in UE and LE, pupils equal and reactive to light. Overall improving from last exam  Laboratory: Most recent CBC Lab Results  Component Value Date   WBC 7.3 07/16/2022   HGB 12.6 (L) 07/16/2022   HCT 37.9 (L) 07/16/2022   MCV 93.6 07/16/2022   PLT 226  07/16/2022   Most recent BMP    Latest Ref Rng & Units 07/16/2022    5:10 AM  BMP  Glucose 70 - 99 mg/dL 60   BUN 6 - 20 mg/dL 16   Creatinine 0.61 - 1.24 mg/dL 1.13   Sodium 135 - 145 mmol/L 137   Potassium 3.5 - 5.1 mmol/L 3.7   Chloride 98 - 111 mmol/L 103   CO2 22 - 32 mmol/L 21   Calcium 8.9 - 10.3 mg/dL 8.5    Darci Current, DO 07/18/2022, 10:37 AM  PGY-1, [place] Intern pager: [phone], text pages welcome Secure chat group [place]

## 2022-07-18 NOTE — Discharge Summary (Signed)
[facility] Discharge Summary  Patient name: Scott Glenn Medical record number: [id number] Date of birth: 14-Mar-1971 Age: 52 y.o. Gender: male Date of Admission: 07/15/2022  Date of Discharge: 07/18/22  Admitting Physician: Scott Covert, MD  Primary Care Provider: Gildardo Pounds, NP Consultants: Neurology  Indication for Hospitalization: Left hemiparesis and aphasia  Discharge Diagnoses/Problem List:  Principal Problem for Admission: Conversion disorder Other Problems addressed during stay:  Principal Problem:   Stroke [facility]) Active Problems:   Acute focal neurological deficit   Left hemiparesis [facility])   Brief Hospital Course:  Scott Glenn is a 52 y.o. male presenting with acute onset left sided weakness. His past medical history is significant for asthma, migraines, and conversion disorder. His hospital course is outlined below:   Left Sided Hemiparesis 2/2 to Conversion Disorder:  Presented to the ED with acute onset left hemiparesis and aphasia. In the ED, a code stroke was called.  CT head and CTA negative for stroke or intracranial abnormality.  EEG was started and he was loaded with Keppra.  Due to previous history of alcohol use disorder CIWA's were ordered and remained negative.  UDS resulted positive for THC.  EEG resulted with no seizure activity.  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  Neurology ruled out acute etiology and diagnosed him with conversion disorder.  PT and OT were consulted and assisted with mobility issues.  Psychiatry was consulted but recommended outpatient follow-up and did not see him inpatient.  He began improving and by time of discharge he was talking, standing on his own, walking.  PT and OT recommended outpatient home health PT and OT. He was prescribed no new medications at discharge.   PCP follow-up: Follow-up with outpatient psychiatry for conversion  disorder. Appears the death of a close friend triggered this event.  May need to provide grief counseling. Follow-up on CODE STATUS.  Patient requested to be DNI but wanted compressions and medication use. Outpatient PT and mobility.  Screen for falls.  Disposition: Home  Discharge Condition: Stable  Discharge Exam:  Vitals:   07/18/22 0828 07/18/22 1221  BP: 117/83 (!) 136/100  Pulse: 66 60  Resp: 18 18  Temp: 98.4 F (36.9 C) 98.3 F (36.8 C)  SpO2: 100% 100%   Well-appearing, no acute distress Cardio: Regular rate, regular rhythm, no murmurs on exam. Pulm: Clear, no wheezing, no crackles. No increased work of breathing Abdominal: bowel sounds present, soft, non-tender, non-distended Extremities: no peripheral edema  Neuro: alert and oriented x3, speech normal in content, no facial asymmetry, strength intact and equal bilaterally in UE and LE, pupils equal and reactive to light. Overall improving from last exam  Significant Procedures: EEG negative for seizure activity  Significant Labs and Imaging:  No results for input(s): "WBC", "HGB", "HCT", "PLT" in the last 48 hours. No results for input(s): "NA", "K", "CL", "CO2", "GLUCOSE", "BUN", "CREATININE", "CALCIUM", "MG", "PHOS", "ALKPHOS", "AST", "ALT", "ALBUMIN", "PROTEIN" in the last 48 hours. CT head code Stroke:  IMPRESSION: 1. No hemorrhage or CT evidence of an acute infarct. 2. Aspects is 10.   CTA Head:  IMPRESSION: 1. No emergent large vessel occlusion or proximal hemodynamically significant stenosis on the CTA. 2. No evidence of core infarct or penumbra on CT perfusion. 3. No evidence of acute fracture or traumatic malalignment on the CT cervical spine.  MRI brain without contrast: No acute intracranial pathology Small remote infarct of the  left.  Frontal gyrus and background chronic small vessel ischemic change, is elevated for age   Results/Tests Pending at Time of Discharge: None  Discharge Medications:   Allergies as of 07/18/2022       Reactions   Banana    Itching, dyspnea and nausea   Bee Venom    Pork-derived Products    Headache, diarrhea and vomiting        Medication List     TAKE these medications    albuterol (2.5 MG/3ML) 0.083% nebulizer solution Commonly known as: PROVENTIL Take 2.5 mg by nebulization daily as needed for wheezing or shortness of breath.   cholecalciferol 25 MCG (1000 UNIT) tablet Commonly known as: VITAMIN D3 Take 1,000 Units by mouth daily.   EPIPEN IJ Inject 1 Pen as directed as needed (For allergic reaction).        Discharge Instructions: Please refer to Patient Instructions section of EMR for full details.  Patient was counseled important signs and symptoms that should prompt return to medical care, changes in medications, dietary instructions, activity restrictions, and follow up appointments.   Follow-Up Appointments:  Scott Glenn, [facility] Follow up.   Specialty: Behavioral Health Why: Psychotherapy Contact information: [address] [place] [address] [phone]                 Scott Current, DO 07/18/2022, 2:04 PM PGY-1, [place]

## 2022-07-18 NOTE — Hospital Course (Addendum)
Scott Glenn is a 52 y.o. male presenting with acute onset left sided weakness. His past medical history is significant for asthma, migraines, and conversion disorder. His hospital course is outlined below:   Left Sided Hemiparesis 2/2 to Conversion Disorder:  Presented to the ED with acute onset left hemiparesis and aphasia. In the ED, a code stroke was called.  CT head and CTA negative for stroke or intracranial abnormality.  EEG was started and he was loaded with Keppra.  Due to previous history of alcohol use disorder CIWA's were ordered and remained negative.  UDS resulted positive for THC.  EEG resulted with no seizure activity.  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  Neurology ruled out acute etiology and diagnosed him with conversion disorder.  PT and OT were consulted and assisted with mobility issues.  Psychiatry was consulted but recommended outpatient follow-up and did not see him inpatient.  He began improving and by time of discharge he was talking, standing on his own, walking.  PT and OT recommended outpatient home health PT and OT. He was prescribed no new medications at discharge.   PCP follow-up: Follow-up with outpatient psychiatry for conversion disorder. Appears the death of a close friend triggered this event.  May need to provide grief counseling. Follow-up on CODE STATUS.  Patient requested to be DNI but wanted compressions and medication use. Outpatient PT and mobility.  Screen for falls.

## 2022-07-18 NOTE — TOC Transition Note (Signed)
Transition of Care [facility]) - CM/SW Discharge Note   Patient Details  Name: Scott Glenn MRN: [id number] Date of Birth: [birth year]  [facility]) CM/SW Contact:  Carles Collet, RN Phone Number: 07/18/2022, 4:07 PM   Clinical Narrative:     Spoke to patient to set up DC plan. Explained that although recommended due to payor source and substance abuse, [place] services could not successfully be set up. Discussed alternative to [facility], outpatient therapies and locations. Patient agreeable to have referral sent to [place]. Referral [id number] for PT OT to [facility] completed. Added location to AVS. Patient instructed to call to schedule if he would like to expedite scheduling.  Discussed DME, he declined 3/1 but agreed to RW. This will be delivered to the room through [place] prior to DC.   Final next level of care: Home/Self Care Barriers to Discharge: No Barriers Identified   Patient Goals and CMS Choice   Choice offered to / list presented to : Patient  Discharge Placement                         Discharge Plan and Services Additional resources added to the After Visit Summary for                  DME Arranged: Gilford Rile DME Agency: [employer] Date DME Agency Contacted: 07/18/22 Time DME Agency Contacted: [phone] Representative spoke with at [place]: [place] ([place]) Interventions [place]: No Food Insecurity (07/15/2022)  Housing: Low Risk  (07/15/2022)  Transportation Needs: No Transportation Needs (07/15/2022)  Utilities: Not At Risk (07/15/2022)  Depression (PHQ2-9): Low Risk  (12/02/2021)  Tobacco Use: Low Risk  (07/15/2022)     Readmission Risk Interventions     No data to display

## 2022-07-18 NOTE — Progress Notes (Signed)
Pt ready to d/c. Rn  attempted to reach on call SW in regards to location of rolling walker. No response. Per patient he is able to get one on his own. Rn informed patient of safety precautions. Charge nurse offered to call patient family when RW arrives at facility aso family can return to pick up if easiest. Family at bedside agreeable. As well as patient.

## 2022-07-19 ENCOUNTER — Telehealth: Payer: Self-pay

## 2022-07-19 NOTE — Telephone Encounter (Addendum)
Transition Care Management Follow-up Telephone Call Date of discharge and from where: 07/18/2022, [facility] How have you been since you were released from the hospital? He said he had a mini stroke and is doing better.   Any questions or concerns? No  Items Reviewed: Did the pt receive and understand the discharge instructions provided? Yes  Medications obtained and verified? Yes - he has Vitamin D3.  He does not have the nebulizer solution or epipen; but he has a nebulizer.  Other? No  Any new allergies since your discharge? No  Dietary orders reviewed? Yes Do you have support at home? Yes   Home Care and Equipment/Supplies: Were home health services ordered? no If so, what is the name of the agency? N/a  Has the agency set up a time to come to the patient's home? not applicable Were any new equipment or medical supplies ordered?  Yes: RW What is the name of the medical supply agency? He is getting it himself; but he did not say where he is picking it up.  Were you able to get the supplies/equipment? He is picking it up today Do you have any questions related to the use of the equipment or supplies? No  Functional Questionnaire: (I = Independent and D = Dependent) ADLs: He will have a walker to use with ambulation.  Has assistance available for ADLS if needed  Follow up appointments reviewed:  PCP Hospital f/u appt confirmed? Yes  Scheduled to see Geryl Rankins, NP - 08/24/2022. The information was text to him as he requested.  [facility] f/u appt confirmed?  He needs to schedule the appointments with behavioral health and outpatient PT.   Are transportation arrangements needed? Yes  If their condition worsens, is the pt aware to call PCP or go to the Emergency Dept.? Yes Was the patient provided with contact information for the PCP's office or ED? Yes Was to pt encouraged to call back with questions or concerns? Yes

## 2022-07-21 ENCOUNTER — Other Ambulatory Visit: Payer: Self-pay | Admitting: Nurse Practitioner

## 2022-07-21 ENCOUNTER — Telehealth: Payer: Self-pay | Admitting: Rehabilitation

## 2022-07-21 ENCOUNTER — Ambulatory Visit: Payer: Medicaid Other | Attending: Family Medicine | Admitting: Rehabilitation

## 2022-07-21 ENCOUNTER — Encounter: Payer: Self-pay | Admitting: Rehabilitation

## 2022-07-21 DIAGNOSIS — R2681 Unsteadiness on feet: Secondary | ICD-10-CM | POA: Insufficient documentation

## 2022-07-21 DIAGNOSIS — R41840 Attention and concentration deficit: Secondary | ICD-10-CM | POA: Diagnosis present

## 2022-07-21 DIAGNOSIS — R41844 Frontal lobe and executive function deficit: Secondary | ICD-10-CM | POA: Diagnosis present

## 2022-07-21 DIAGNOSIS — G8194 Hemiplegia, unspecified affecting left nondominant side: Secondary | ICD-10-CM | POA: Diagnosis present

## 2022-07-21 DIAGNOSIS — M6281 Muscle weakness (generalized): Secondary | ICD-10-CM | POA: Insufficient documentation

## 2022-07-21 NOTE — Therapy (Addendum)
OUTPATIENT PHYSICAL THERAPY NEURO EVALUATION   Patient Name: Scott Glenn MRN: [id number] DOB:May 02, 1971, 52 y.o., male 41 Date: 07/21/2022   PCP: Geryl Rankins, NP  REFERRING PROVIDER: Talbert Cage, MD (hospitalist so will send cert to [place])  [place]:  PT End of Session - 07/21/22 0940     Visit Number 1    Number of Visits 17    Date for PT Re-Evaluation 09/19/22    Authorization Type [employer] Medicaid    Authorization - Number of Visits 27    PT Start Time 0933    PT Stop Time 1015    PT Time Calculation (min) 42 min    Activity Tolerance Patient tolerated treatment well    Behavior During Therapy WFL for tasks assessed/performed             Past Medical History:  Diagnosis Date   Asthma    Chicken pox    Migraines    Seasonal allergies    History reviewed. No pertinent surgical history. Patient Active Problem List   Diagnosis Date Noted   Stroke [facility]) 07/15/2022   Acute focal neurological deficit 07/15/2022   Left hemiparesis ([place]) 07/15/2022   Vitamin D deficiency 09/25/2020   Elevated blood pressure reading without diagnosis of hypertension 09/23/2020   Acute non intractable tension-type headache 09/23/2020   Psychophysiological insomnia 09/23/2020   Stress 09/23/2020   Fatigue 09/23/2020   ALLERGIC RHINITIS WITH CONJUNCTIVITIS 05/02/2008   ASTHMA 05/02/2008   GASTRITIS 03/03/2007    ONSET DATE: 07/15/22 (per ED notes) Pt reports he doesn't remember anything from 07/14/22 that night.    REFERRING DIAG:  G81.90 (ICD-10-CM) - Hemiparesis ([place])    THERAPY DIAG:  Unsteadiness on feet  Hemiplegia affecting left nondominant side, unspecified etiology, unspecified hemiplegia type (HCC)  Muscle weakness (generalized)  Rationale for Evaluation and Treatment: Rehabilitation  SUBJECTIVE:                                                                                                                                                                                              SUBJECTIVE STATEMENT: Per pt/wife report, he doesn't remember anything from night of 07/14/22 when he went to see wife at [facility] and then nothing from that night once she was home.  Then fell at home that night, had increased confusion, unable to open eyes.  Called 911 and ambulance took to hospital.    Pt accompanied by: significant other [name])  PERTINENT HISTORY: PMH includes migraines, chicken pox, asthma.  PAIN:  Are you having pain? Yes: NPRS scale: 1-2/10 Pain location: Under left arm and at left  calf Pain description: achy Aggravating factors: standing for long periods Relieving factors: takes shoe off and elevates foot   PRECAUTIONS: Fall  WEIGHT BEARING RESTRICTIONS: No  FALLS: Has patient fallen in last 6 months? Yes. 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    SENSATION: Light touch: Impaired numbness in L heel, L thumb  COORDINATION: Grossly WFL (some weakness noted in LLE)   POSTURE: No Significant postural limitations  LOWER EXTREMITY ROM:      Right Eval Left Eval  Hip flexion    Hip extension    Hip abduction    Hip adduction    Hip internal rotation    Hip external rotation    Knee flexion    Knee extension    Ankle dorsiflexion    Ankle plantarflexion    Ankle inversion    Ankle eversion     (Blank rows = not tested)All tested in seated position   LOWER EXTREMITY MMT:    MMT Right Eval Left Eval  Hip flexion 5/5  3+/5  Hip extension    Hip abduction 5/5 3+/5  Hip adduction 5/5 3+/5  Hip internal rotation    Hip external rotation    Knee flexion 5/5 3-/5  Knee extension 5/5 3+/5  Ankle dorsiflexion 5/5 3-/5  Ankle plantarflexion 5/5 3/5  Ankle inversion    Ankle eversion    (Blank rows = not tested)    TRANSFERS: Assistive device utilized: None  Sit to stand: Modified independence Stand to sit: Modified independence Chair to chair: Modified independence Floor:  n/a    STAIRS: Level of Assistance: SBA Stair Negotiation Technique: Step to Pattern Forwards with Bilateral Rails Number of Stairs: 4  Height of Stairs: 6  Comments: Pt performed stairs in step to manner with heavy reliance on UEs.   GAIT: Gait pattern: step to pattern, step through pattern, decreased stride length, decreased ankle dorsiflexion- Left, shuffling, and poor foot clearance- Left Distance walked: 11' x 2 reps with RW Assistive device utilized: Environmental consultant - 2 wheeled Level of assistance: SBA Comments: Pt with inconsistent gait pattern, good hip and knee flexion noted but intermittent L toe catching at times   FUNCTIONAL TESTS:  5 times sit to stand: 30.26 secs with heavy use of RLE vs LLE 10 meter walk test: 1.43 ft/sec with RW, 1.44 ft/sec without RW min/guard Functional gait assessment: 11/30    TODAY'S TREATMENT:  DATE: None initiated today     PATIENT EDUCATION: Education details: Educated on evaluation results, POC, goals Person educated: Patient and Spouse Education method: Explanation and Verbal cues Education comprehension: verbalized understanding and needs further education  HOME EXERCISE PROGRAM: None initiated today, just educated to walk inside as able for exercise  GOALS: Goals reviewed with patient? Yes  SHORT TERM GOALS: Target date: 08/21/22  Pt will be IND  with initial HEP in order to indicate improved functional mobility and dec fall risk. Baseline:None initiated today  Goal status: INITIAL  2.  Pt will improve gait speed to >/=2.0 ft/sec w/ RW in order to indicate dec fall risk.  Baseline: 1.43 ft/sec with RW Goal status: INITIAL  3.  Pt will ambulate 150' with quad tip cane at S level in order to indicate more independent household ambulation.   Baseline: Ambulates with RW at this time  Goal status: INITIAL  4.  Will assess appropriateness of foot up brace on LLE to assist with foot clearance.  Baseline: Not assessed at this time  Goal status: INITIAL  5.  Pt will improve FGA to >/=17/30 in order to indicate dec fall risk.   Baseline: 11/30 Goal status: INITIAL  6.  Pt will negotiate up/down 4 steps with single rail/cane, up/down ramp and curb and ambulate x 500' outdoors over unlevel paved surfaces at S level with LRAD in order to indicate improved community mobility.   Baseline: Needs B rails for stairs and RW for all gait Goal status: INITIAL  LONG TERM GOALS: Target date: 09/19/22  Pt will be IND with final HEP in order to indicate improved functional mobility and dec fall risk. Baseline: None initiated on eval Goal status: INITIAL  2.  Pt will improve gait speed to >/=2.62 ft/sec w/ cane in order to indicate dec fall risk.  Baseline: 1.44 ft/sec with RW Goal status: INITIAL  3.  Pt will improve FGA to >/=23/30 in order to indicate dec fall risk.   Baseline: 11/30 Goal status: INITIAL  4.  Pt will improve 5TSS to </=15 secs without UE support with equal LE WB in order to indicate dec fall risk and improved functional strength.   Baseline: 30.26 secs with heavy use of RLE Goal status: INITIAL  5.  Pt will negotiate up/down 4 steps with single rail, up/down ramp and curb and ambulate x 500' outdoors over unlevel paved surfaces at mod I level with cane in order to indicate improved community mobility.   Baseline:  Needing B rails for stairs, RW for all gait  Goal status: INITIAL  6.  Pt will ambulate x 150' without AD at S level in order to simulate improved mobility in household setting.  Baseline: Needs RW for safety Goal status: INITIAL  ASSESSMENT:  CLINICAL IMPRESSION: Patient is a 52 y/o male who presents on 1/11 with left sided weakness and difficulty speaking. NIH:18. Head CT, MRI and EEG-unremarkable. Some concern for underlying functional etiology to his presentation given he was noted to raise his Lft arm and Lft leg up to assist with changing into hospital gown in ED. PMH includes migraines, chicken pox, asthma.  OBJECTIVE IMPAIRMENTS: Abnormal gait, decreased balance, decreased mobility, decreased strength, impaired sensation, and impaired UE functional use.   ACTIVITY LIMITATIONS: carrying, lifting, standing, squatting, stairs, transfers, and locomotion level  PARTICIPATION LIMITATIONS: cleaning, laundry, personal finances, driving, shopping, community activity, occupation, and yard work  PERSONAL FACTORS: Behavior pattern, Profession, and 1-2 comorbidities: see above  are also  affecting patient's functional outcome.   REHAB POTENTIAL: Excellent  CLINICAL DECISION MAKING: Evolving/moderate complexity  EVALUATION COMPLEXITY: Moderate  PLAN:  PT FREQUENCY: 2x/week  PT DURATION: 8 weeks  PLANNED INTERVENTIONS: Therapeutic exercises, Therapeutic activity, Neuromuscular re-education, Balance training, Gait training, Patient/Family education, Self Care, Stair training, Vestibular training, Orthotic/Fit training, and DME instructions  PLAN FOR NEXT SESSION: Trial foot up brace, provide HEP for standing LLE strengthening (sit<>stand, standing hip abd/ext/knee flex).  Add balance as able, gait without device    Cameron Sprang, PT, MPT [facility] [address] [place], [place], [address] Phone: [phone]   Fax:  [phone] 07/21/22,  12:14 PM   Check all possible CPT codes: 97110- Therapeutic Exercise, [phone]- Neuro Re-education, [phone] - Gait Training, [phone] - Therapeutic Activities, [phone] - Self Care, and [phone] - Ruth all conditions that are expected to impact treatment: Unknown   If treatment provided at initial evaluation, no treatment charged due to lack of authorization.

## 2022-07-21 NOTE — Telephone Encounter (Signed)
Hey Zelda!  I am seeing Scott Glenn here at [facility] for PT due to new onset of L hemiplegia.  I think he follows up with you in Feb, however his referring provider here is a hospitalist, so I am reaching out to you.  He feels L arm function is also decreased and would like to see OT for evaluation.  Could you please write in Epic and we can schedule next time he is here.   Thanks! Cameron Sprang, PT, MPT [facility] [address] [place], [place], [address] Phone: [phone]   Fax:  [phone] 07/21/22, 12:17 PM

## 2022-07-26 ENCOUNTER — Encounter: Payer: Self-pay | Admitting: Physical Therapy

## 2022-07-26 ENCOUNTER — Ambulatory Visit: Payer: Medicaid Other | Admitting: Physical Therapy

## 2022-07-26 ENCOUNTER — Telehealth: Payer: Self-pay | Admitting: Nurse Practitioner

## 2022-07-26 DIAGNOSIS — R2681 Unsteadiness on feet: Secondary | ICD-10-CM | POA: Diagnosis not present

## 2022-07-26 DIAGNOSIS — M6281 Muscle weakness (generalized): Secondary | ICD-10-CM

## 2022-07-26 DIAGNOSIS — G8194 Hemiplegia, unspecified affecting left nondominant side: Secondary | ICD-10-CM

## 2022-07-26 NOTE — Therapy (Signed)
OUTPATIENT PHYSICAL THERAPY NEURO TREATMENT   Patient Name: Scott Glenn MRN: [id number] DOB:1971/01/02, 52 y.o., male 54 Date: 07/26/2022   PCP: Geryl Rankins, NP  REFERRING PROVIDER: Talbert Cage, MD (hospitalist so will send cert to [place])  [place]:  PT End of Session - 07/26/22 0931     Visit Number 2    Number of Visits 17    Date for PT Re-Evaluation 09/19/22    Authorization Type UHC Medicaid    Authorization - Number of Visits 27    PT Start Time 0930    PT Stop Time 1015    PT Time Calculation (min) 45 min    Activity Tolerance Patient tolerated treatment well    Behavior During Therapy WFL for tasks assessed/performed              Past Medical History:  Diagnosis Date   Asthma    Chicken pox    Migraines    Seasonal allergies    History reviewed. No pertinent surgical history. Patient Active Problem List   Diagnosis Date Noted   Stroke [facility]) 07/15/2022   Acute focal neurological deficit 07/15/2022   Left hemiparesis ([place]) 07/15/2022   Vitamin D deficiency 09/25/2020   Elevated blood pressure reading without diagnosis of hypertension 09/23/2020   Acute non intractable tension-type headache 09/23/2020   Psychophysiological insomnia 09/23/2020   Stress 09/23/2020   Fatigue 09/23/2020   ALLERGIC RHINITIS WITH CONJUNCTIVITIS 05/02/2008   ASTHMA 05/02/2008   GASTRITIS 03/03/2007    ONSET DATE: 07/15/22 (per ED notes) Pt reports he doesn't remember anything from 07/14/22 that night.    REFERRING DIAG:  G81.90 (ICD-10-CM) - Hemiparesis ([place])    THERAPY DIAG:  Unsteadiness on feet  Hemiplegia affecting left nondominant side, unspecified etiology, unspecified hemiplegia type (HCC)  Muscle weakness (generalized)  Rationale for Evaluation and Treatment: Rehabilitation  SUBJECTIVE:                                                                                                                                                                                              SUBJECTIVE STATEMENT: Pt reports no falls.  Walking at home (without AD) and short distances outside. Pt accompanied by: significant other [name])  PERTINENT HISTORY: PMH includes migraines, chicken pox, asthma.  PAIN:  Are you having pain? No PRECAUTIONS: Fall  WEIGHT BEARING RESTRICTIONS: No  FALLS: Has patient fallen in last 6 months? Yes. 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 no AD   Ambulation Distance (Feet) 850 Feet   in 6 min walk test.   Assistive device None    Gait Pattern Within Functional Limits;Step-through pattern;Decreased arm swing - right;Decreased arm swing - left;Decreased trunk rotation    Ambulation Surface Level;Indoor    Gait velocity decreased      Knee/Hip Exercises: Aerobic   Stepper 12 min- 8 min level 3 + 4 min level 5 min . 50-65  RPM          Balance Exercises - 07/26/22 0001       Balance Exercises: Standing   Other Standing Exercises full squats (down to floor) with cues for weight shifting L.         PATIENT EDUCATION: Education details: HEP walking program and squats.  Addressed pt's questions about returning to work. Pt has follow up with PCP Feb 20th.  Pt lifts heavy items and squats for work. Person  educated: Patient and Spouse Education method: Explanation and Verbal cues Education comprehension: verbalized understanding and needs further education  HOME EXERCISE PROGRAM: Walking Program: Begin walking for exercise for 10 minutes, 1-2 times/day, most days/week.   Progress your walking program by adding 1- minutes to your routine each week, as tolerated. Be sure to wear good walking shoes, walk in a safe environment and only progress to your tolerance.       WORK ON increasing speed safely  Access Code: [email or web address] URL: [email or web address] Date: 07/26/2022 Prepared by: Oneita Kras  Exercises - Deep Squat   - 1-2 x daily - 5 x weekly - 2-3 sets - 5 reps - 3 hold  GOALS: Goals reviewed with patient? Yes  SHORT TERM GOALS: Target date: 08/21/22  Pt will be IND with initial HEP in order to indicate improved functional mobility and dec fall risk. Baseline:None initiated today  Goal status: INITIAL  2.  Pt will improve gait speed to >/=2.0 ft/sec w/ RW in order to indicate dec fall risk.  Baseline: 1.43 ft/sec with RW Goal status: INITIAL  3.  Pt will ambulate 150' with quad tip cane at S level in order to indicate more independent household ambulation.   Baseline: Ambulates with RW at this time  Goal status: INITIAL  4.  Will assess appropriateness of foot up brace on LLE to assist with foot clearance.  Baseline: Not assessed at this  time  Goal status: INITIAL  5.  Pt will improve FGA to >/=17/30 in order to indicate dec fall risk.   Baseline: 11/30 Goal status: INITIAL  6.  Pt will negotiate up/down 4 steps with single rail/cane, up/down ramp and curb and ambulate x 500' outdoors over unlevel paved surfaces at S level with LRAD in order to indicate improved community mobility.   Baseline: Needs B rails for stairs and RW for all gait Goal status: INITIAL  LONG TERM GOALS: Target date: 09/19/22  Pt will be IND with final HEP in order to indicate improved  functional mobility and dec fall risk. Baseline: None initiated on eval Goal status: INITIAL  2.  Pt will improve gait speed to >/=2.62 ft/sec w/ cane in order to indicate dec fall risk.  Baseline: 1.44 ft/sec with RW Goal status: INITIAL  3.  Pt will improve FGA to >/=23/30 in order to indicate dec fall risk.   Baseline: 11/30 Goal status: INITIAL  4.  Pt will improve 5TSS to </=15 secs without UE support with equal LE WB in order to indicate dec fall risk and improved functional strength.   Baseline: 30.26 secs with heavy use of RLE Goal status: INITIAL  5.  Pt will negotiate up/down 4 steps with single rail, up/down ramp and curb and ambulate x 500' outdoors over unlevel paved surfaces at mod I level with cane in order to indicate improved community mobility.   Baseline: Needing B rails for stairs, RW for all gait  Goal status: INITIAL  6.  Pt will ambulate x 150' without AD at S level in order to simulate improved mobility in household setting.  Baseline: Needs RW for safety Goal status: INITIAL  ASSESSMENT:  CLINICAL IMPRESSION: Pt is progressing well ambulating on indoor surfaces at ModI (no AD) level.  Pt reports and demonstrates limiting factor to PLOF are fatigue, imbalance at times, and L side weakness. Initiated HEP to address impairments.  OBJECTIVE IMPAIRMENTS: Abnormal gait, decreased balance, decreased mobility, decreased strength, impaired sensation, and impaired UE functional use.   ACTIVITY LIMITATIONS: carrying, lifting, standing, squatting, stairs, transfers, and locomotion level  PARTICIPATION LIMITATIONS: cleaning, laundry, personal finances, driving, shopping, community activity, occupation, and yard work  PERSONAL FACTORS: Behavior pattern, Profession, and 1-2 comorbidities: see above  are also affecting patient's functional outcome.   REHAB POTENTIAL: Excellent  CLINICAL DECISION MAKING: Evolving/moderate complexity  EVALUATION COMPLEXITY:  Moderate  PLAN:  PT FREQUENCY: 2x/week  PT DURATION: 8 weeks  PLANNED INTERVENTIONS: Therapeutic exercises, Therapeutic activity, Neuromuscular re-education, Balance training, Gait training, Patient/Family education, Self Care, Stair training, Vestibular training, Orthotic/Fit training, and DME instructions  PLAN FOR NEXT SESSION:   Add high level strengthening and  balance as able, gait without device    Bjorn Loser, PTA  07/26/22, 10:47 AM

## 2022-07-26 NOTE — Patient Instructions (Signed)
Walking Program:  Begin walking for exercise for 10 minutes, 1-2 times/day, most days/week.   Progress your walking program by adding 1- minutes to your routine each week, as tolerated. Be sure to wear good walking shoes, walk in a safe environment and only progress to your tolerance.        WORK ON increasing speed safely

## 2022-07-26 NOTE — Telephone Encounter (Signed)
Copied from [place] [id number]. Topic: General - Other >> Jul 26, 2022  3:24 PM Chapman Fitch wrote: Reason for CRM: pts employer needs a letter stating what the pts restrictions are at work / pt is currently in physical therapy and has hosp f/u is 2.20.24/ but pt needs to return to work asap / please advise/ pt stated his left side is still numb and his balance isnt too well/ pt asked if provider can also give his employer a call so he can keep his Job / employer is [employer] / phone number is [phone] his bosses direct number [address] size is [phone]

## 2022-07-27 NOTE — Telephone Encounter (Signed)
Please set him up a virtual visit. I need to know what he does at work and discuss this situation with him prior to giving him a letter

## 2022-07-28 ENCOUNTER — Encounter: Payer: Self-pay | Admitting: Nurse Practitioner

## 2022-07-28 ENCOUNTER — Telehealth: Payer: Medicaid Other | Admitting: Nurse Practitioner

## 2022-07-28 NOTE — Telephone Encounter (Signed)
Pt is calling back to follow up and requested an appointment as soon as possible, if possible, to speak with PCP.   Please advise.

## 2022-07-28 NOTE — Telephone Encounter (Signed)
Patient aware of 1:30 telephone appt today.

## 2022-07-29 ENCOUNTER — Ambulatory Visit: Payer: Medicaid Other | Admitting: Physical Therapy

## 2022-07-29 ENCOUNTER — Encounter: Payer: Self-pay | Admitting: Physical Therapy

## 2022-07-29 DIAGNOSIS — R2681 Unsteadiness on feet: Secondary | ICD-10-CM

## 2022-07-29 DIAGNOSIS — G8194 Hemiplegia, unspecified affecting left nondominant side: Secondary | ICD-10-CM

## 2022-07-29 DIAGNOSIS — M6281 Muscle weakness (generalized): Secondary | ICD-10-CM

## 2022-07-29 NOTE — Therapy (Addendum)
OUTPATIENT PHYSICAL THERAPY NEURO TREATMENT   Patient Name: Scott Glenn MRN: [id number] DOB:11-01-1970, 52 y.o., male 59 Date: 07/29/2022   PCP: Geryl Rankins, NP  REFERRING PROVIDER: Talbert Cage, MD (hospitalist so will send cert to [place])  [place]:  PT End of Session - 07/29/22 0901     Visit Number 3    Number of Visits 17    Date for PT Re-Evaluation 09/19/22    Authorization Type [employer] Medicaid    Authorization - Number of Visits 27    PT Start Time [phone]   pt late   PT Stop Time 0930    PT Time Calculation (min) 32 min    Equipment Utilized During Treatment Gait belt    Activity Tolerance Patient tolerated treatment well    Behavior During Therapy WFL for tasks assessed/performed              Past Medical History:  Diagnosis Date   Asthma    Chicken pox    Migraines    Seasonal allergies    History reviewed. No pertinent surgical history. Patient Active Problem List   Diagnosis Date Noted   Stroke [facility]) 07/15/2022   Acute focal neurological deficit 07/15/2022   Left hemiparesis ([place]) 07/15/2022   Vitamin D deficiency 09/25/2020   Elevated blood pressure reading without diagnosis of hypertension 09/23/2020   Acute non intractable tension-type headache 09/23/2020   Psychophysiological insomnia 09/23/2020   Stress 09/23/2020   Fatigue 09/23/2020   ALLERGIC RHINITIS WITH CONJUNCTIVITIS 05/02/2008   ASTHMA 05/02/2008   GASTRITIS 03/03/2007    ONSET DATE: 07/15/22 (per ED notes) Pt reports he doesn't remember anything from 07/14/22 that night.    REFERRING DIAG:  G81.90 (ICD-10-CM) - Hemiparesis ([place])    THERAPY DIAG:  Unsteadiness on feet  Hemiplegia affecting left nondominant side, unspecified etiology, unspecified hemiplegia type (HCC)  Muscle weakness (generalized)  Rationale for Evaluation and Treatment: Rehabilitation  SUBJECTIVE:                                                                                                                                                                                              SUBJECTIVE STATEMENT: Pt ambulates into clinic with wife not using AD.  Pt reports no falls.  Walking everywhere without cane at this point.  Thinks his granddaughter gave him a stomach bug as his stomach is hurting this morning.  States his walking program and HEP are going well.  He returns to work next week so will likely not be able to come to therapy next week.  He is only really noticing a discrepancy in  his left side vs right in a couple of fingers.  He did not sleep well last night and is tired today. Pt accompanied by: significant other Scott Glenn)  PERTINENT HISTORY: PMH includes migraines, chicken pox, asthma.  PAIN:  Are you having pain? No PRECAUTIONS: Fall  WEIGHT BEARING RESTRICTIONS: No  FALLS: Has patient fallen in last 6 months? Yes. 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  Discussed rescheduling appts for after 2pm as that is when he gets off. -Pt is independent in performance of 2-8" hurdles forwards and laterally -Pt is independent in performance of forwards and backward tandem ambulation w/o UE support -Forwards ambulation w/ eyes closed, left drift, no LOB, SBA - added countertop version to HEP -Backwards ambulation x30' independently -Treadmill training on incline cycle 2 mins x2 at 2% over  total 8 minutes w/ flat ground warmup and cooldown  PATIENT EDUCATION: Education details: HEP walking program and squats.  Addressed pt's questions about returning to work. Pt has follow up with PCP Feb 20th.  Pt lifts heavy items and squats for work.  Person educated: Patient and Spouse Education method: Explanation and Verbal cues Education comprehension: verbalized understanding and needs further education  HOME EXERCISE PROGRAM: Walking Program: Begin walking for exercise for 10 minutes, 1-2 times/day, most days/week.   Progress your walking program by adding 1- minutes to your routine each week, as tolerated. Be sure to wear good walking shoes, walk in a safe environment and only progress to your tolerance.       WORK ON increasing speed safely  Access Code: [email or web address] URL: [email or web address] Date: 07/26/2022 Prepared by: Oneita Kras  Exercises - Deep Squat   - 1-2 x daily - 5 x weekly - 2-3 sets - 5 reps - 3 hold - Walking with Eyes Closed and Counter Support  - 1 x daily - 5 x weekly - 2 sets - 10 reps  GOALS: Goals reviewed with patient? Yes  SHORT TERM GOALS: Target date: 08/21/22  Pt will be IND with initial HEP in order to indicate improved functional mobility and dec fall risk. Baseline:None initiated today  Goal status: INITIAL  2.  Pt will improve gait speed to >/=2.0 ft/sec w/ RW in order to indicate dec fall risk.  Baseline: 1.43 ft/sec with RW Goal status: INITIAL  3.  Pt  will ambulate 150' with quad tip cane at S level in order to indicate more independent household ambulation.   Baseline: Ambulates with RW at this time  Goal status: INITIAL  4.  Will assess appropriateness of foot up brace on LLE to assist with foot clearance.  Baseline: Not assessed at this time  Goal status: INITIAL  5.  Pt will improve FGA to >/=17/30 in order to indicate dec fall risk.   Baseline: 11/30 Goal status: INITIAL  6.  Pt will negotiate up/down 4 steps  with single rail/cane, up/down ramp and curb and ambulate x 500' outdoors over unlevel paved surfaces at S level with LRAD in order to indicate improved community mobility.   Baseline: Needs B rails for stairs and RW for all gait Goal status: INITIAL  LONG TERM GOALS: Target date: 09/19/22  Pt will be IND with final HEP in order to indicate improved functional mobility and dec fall risk. Baseline: None initiated on eval Goal status: INITIAL  2.  Pt will improve gait speed to >/=2.62 ft/sec w/ cane in order to indicate dec fall risk.  Baseline: 1.44 ft/sec with RW Goal status: INITIAL  3.  Pt will improve FGA to >/=23/30 in order to indicate dec fall risk.   Baseline: 11/30 Goal status: INITIAL  4.  Pt will improve 5TSS to </=15 secs without UE support with equal LE WB in order to indicate dec fall risk and improved functional strength.   Baseline: 30.26 secs with heavy use of RLE Goal status: INITIAL  5.  Pt will negotiate up/down 4 steps with single rail, up/down ramp and curb and ambulate x 500' outdoors over unlevel paved surfaces at mod I level with cane in order to indicate improved community mobility.   Baseline: Needing B rails for stairs, RW for all gait  Goal status: INITIAL  6.  Pt will ambulate x 150' without AD at S level in order to simulate improved mobility in household setting.  Baseline: Needs RW for safety Goal status: INITIAL  ASSESSMENT:  CLINICAL IMPRESSION: Session limited by patient being late today.  Focused on addressing prior noted dynamic deficits with single addition to HEP.  Pt endorses his equilibrium feels off during ambulation w/ eyes closed so may benefit from further dynamic trials of with visually limited conditions.  He is returning to work next week which may impact his PT schedule.  At this time he is making speedy recovery and is tolerating all high levels challenges well.  OBJECTIVE IMPAIRMENTS: Abnormal gait, decreased balance, decreased  mobility, decreased strength, impaired sensation, and impaired UE functional use.   ACTIVITY LIMITATIONS: carrying, lifting, standing, squatting, stairs, transfers, and locomotion level  PARTICIPATION LIMITATIONS: cleaning, laundry, personal finances, driving, shopping, community activity, occupation, and yard work  PERSONAL FACTORS: Behavior pattern, Profession, and 1-2 comorbidities: see above  are also affecting patient's functional outcome.   REHAB POTENTIAL: Excellent  CLINICAL DECISION MAKING: Evolving/moderate complexity  EVALUATION COMPLEXITY: Moderate  PLAN:  PT FREQUENCY: 2x/week  PT DURATION: 8 weeks  PLANNED INTERVENTIONS: Therapeutic exercises, Therapeutic activity, Neuromuscular re-education, Balance training, Gait training, Patient/Family education, Self Care, Stair training, Vestibular training, Orthotic/Fit training, and DME instructions  PLAN FOR NEXT SESSION:   Add high level strengthening and  balance as able, gait without device, eyes closed, dynamic on compliant/unlevel surfaces, farmers carry, BOSU surge, lifting mechanics   Elease Etienne, PT, DPT 07/29/22, 9:36 AM

## 2022-07-29 NOTE — Patient Instructions (Signed)
-  Walking with Eyes Closed and Counter Support  - 1 x daily - 5 x weekly - 2 sets - 10 reps

## 2022-08-02 ENCOUNTER — Ambulatory Visit: Payer: Medicaid Other | Admitting: Physical Therapy

## 2022-08-02 ENCOUNTER — Ambulatory Visit: Payer: Medicaid Other | Admitting: Occupational Therapy

## 2022-08-02 ENCOUNTER — Encounter: Payer: Self-pay | Admitting: Physical Therapy

## 2022-08-02 DIAGNOSIS — R2681 Unsteadiness on feet: Secondary | ICD-10-CM | POA: Diagnosis not present

## 2022-08-02 DIAGNOSIS — G8194 Hemiplegia, unspecified affecting left nondominant side: Secondary | ICD-10-CM

## 2022-08-02 DIAGNOSIS — M6281 Muscle weakness (generalized): Secondary | ICD-10-CM

## 2022-08-02 DIAGNOSIS — R41840 Attention and concentration deficit: Secondary | ICD-10-CM

## 2022-08-02 DIAGNOSIS — R41844 Frontal lobe and executive function deficit: Secondary | ICD-10-CM

## 2022-08-02 NOTE — Therapy (Signed)
OUTPATIENT OCCUPATIONAL THERAPY NEURO EVALUATION  Patient Name: Scott Glenn MRN: [id number] DOB:[birth year], 52 y.o., male 20 Date: 08/02/2022  PCP: Sharyn Blitz REFERRING PROVIDER: Geryl Rankins, NP  END OF SESSION:  OT End of Session - 08/02/22 0854     Visit Number 1    Number of Visits 13    Date for OT Re-Evaluation 09/20/22    Authorization Type [employer] Medicaid    Authorization Time Period 7 weeks- 12 visits plus eval over 7 weeks    OT Start Time 0850    OT Stop Time 0918    OT Time Calculation (min) 28 min    Activity Tolerance Patient tolerated treatment well    Behavior During Therapy WFL for tasks assessed/performed             Past Medical History:  Diagnosis Date   Asthma    Chicken pox    Migraines    Seasonal allergies    No past surgical history on file. Patient Active Problem List   Diagnosis Date Noted   Stroke [facility]) 07/15/2022   Acute focal neurological deficit 07/15/2022   Left hemiparesis ([place]) 07/15/2022   Vitamin D deficiency 09/25/2020   Elevated blood pressure reading without diagnosis of hypertension 09/23/2020   Acute non intractable tension-type headache 09/23/2020   Psychophysiological insomnia 09/23/2020   Stress 09/23/2020   Fatigue 09/23/2020   ALLERGIC RHINITIS WITH CONJUNCTIVITIS 05/02/2008   ASTHMA 05/02/2008   GASTRITIS 03/03/2007    ONSET DATE: 07/15/22  REFERRING DIAG:  Diagnosis  G81.94 (ICD-10-CM) - Left hemiparesis (HCC)    THERAPY DIAG:   Muscle weakness (generalized) - Plan: Ot plan of care cert/re-cert  Hemiplegia affecting left nondominant side, unspecified etiology, unspecified hemiplegia type ([place]) - Plan: Ot plan of care cert/re-cert  Unsteadiness on feet - Plan: Ot plan of care cert/re-cert  Attention and concentration deficit - Plan: Ot plan of care cert/re-cert  Frontal lobe and executive function deficit - Plan: Ot plan of care cert/re-cert  Rationale for Evaluation and Treatment:  Rehabilitation  SUBJECTIVE:   SUBJECTIVE STATEMENT: 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 NIH:18. Head CT, MRI and EEG-unremarkable. Some concern for underlying functional etiology to his presentation given he was noted to raise his Lft arm and Lft leg up to assist with changing into hospital gown in ED. PMH includes migraines, chicken pox, asthma.  PRECAUTIONS: fall  WEIGHT BEARING RESTRICTIONS: No  PAIN:  Are you having pain? No  FALLS: Has patient fallen in last 6 months? 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  OBJECTIVE:   HAND DOMINANCE: Right  ADLs: Overall ADLs: mod I with all basic ADLS Transfers/ambulation related to ADLs:mod I   IADLs: Shopping: needs assist for transportation  Light housekeeping: washes dishes, cleaning Meal Prep: mod I per pt report, however he reports burning himself Community mobility: mod I Medication management: handles own meds Handwriting:  uses RUE  MOBILITY STATUS: Independent    ACTIVITY TOLERANCE: Activity tolerance: fatigues after 20-30 mins of activity on his feet  FUNCTIONAL OUTCOME MEASURES: Upper Extremity Functional Scale (UEFS): 86.25%  UPPER EXTREMITY ROM:  WFLS bilaterally    UPPER EXTREMITY  MMT:     MMT Right eval Left eval  Shoulder flexion 5/5 4+5  Shoulder abduction    Shoulder adduction    Shoulder extension    Shoulder internal rotation    Shoulder external rotation    Middle trapezius    Lower trapezius    Elbow flexion 5/5 4+/5  Elbow extension 5/5 4+/5  Wrist flexion    Wrist extension    Wrist ulnar deviation    Wrist radial deviation     Wrist pronation    Wrist supination    (Blank rows = not tested)  HAND FUNCTION: Grip strength: Right: 116.8 lbs; Left: 60.8 lbs  COORDINATION: 9 Hole Peg test: Right: 30.66 sec; Left: 24.97 sec  SENSATION: Light touch: Impaired  for middle and tring finger of left hand    COGNITION: Overall cognitive status: Impaired, short term memory deficits, slowed processing  VISION: Subjective report: difficulty reading fine print Baseline vision: Wears glasses for reading only   VISION ASSESSMENT: Not tested Reports difficulty with reading fine print, has readers but did not have them today        TODAY'S TREATMENT:                                                                                                                              DATE: n/a eval only   PATIENT EDUCATION: Education details: role of OT , potential OT goals Person educated: Patient Education method: Explanation Education comprehension: verbalized understanding  HOME EXERCISE PROGRAM: N/a   GOALS: Goals reviewed with patient? Yes  SHORT TERM GOALS: Target date: 09/01/22  I with HEP for grip strength Baseline:dependent Goal status: INITIAL  2.  Pt will increase LUE grip stength by 10 # for increased functional use Baseline: RUE 116.8 #, LUE 60.8# Goal status: INITIAL  3.  Pt will verbalize understanding of sensory precautions for LUE. Baseline: numbness in middle and ring finer LUE Goal status: INITIAL  4.  Pt will verbalize understanding of recommendations for safe exercise Baseline: dependent Goal status: INITIAL     LONG TERM GOALS: Target date: 09/20/22  I with HEP for proximal strength Baseline: dependent Goal status: INITIAL  2.  Pt will demonstrate improved upper extremity function as evidenced by improve UEFS to 90% or better. Baseline: 86.25% Goal status: INITIAL  3.  Pt will perform simulated work activities modified independently without drops Baseline: on light  duty, unable to lift heavier items due to risk for injury Goal status:   4.  Pt will report ability to perform home management/ functional activity in standing for 45 mins without rest or dyspnea Baseline: 20-30 mins prior to becoming fatigued/ winded Goal status: INITIAL    ASSESSMENT:  CLINICAL IMPRESSION: Patient is a 52 y.o. male who was seen today for occupational therapy evaluation for G81.94 (ICD-10-CM) - Left hemiparesis (HCC)Pt presented to hospital  1/11 with left sided weakness and difficulty speaking. NIH:18. Head CT, MRI and EEG-unremarkable.  Some concern for underlying functional etiology to his presentation given he was noted to raise his Lft arm and Lft leg up to assist with changing into hospital gown in ED. PMH includes migraines, chicken pox, asthma.   PERFORMANCE DEFICITS: in functional skills including ADLs, IADLs, coordination, dexterity, sensation, ROM, strength, flexibility, Fine motor control, Gross motor control, mobility, balance, endurance, decreased knowledge of precautions, decreased knowledge of use of DME, vision, and UE functional use, cognitive skills including memory, and psychosocial skills including coping strategies, environmental adaptation, habits, interpersonal interactions, and routines and behaviors.   IMPAIRMENTS: are limiting patient from ADLs, IADLs, work, leisure, and social participation.   CO-MORBIDITIES: may have co-morbidities  that affects occupational performance. Patient will benefit from skilled OT to address above impairments and improve overall function.  MODIFICATION OR ASSISTANCE TO COMPLETE EVALUATION: No modification of tasks or assist necessary to complete an evaluation.  OT OCCUPATIONAL PROFILE AND HISTORY: Problem focused assessment: Including review of records relating to presenting problem.  CLINICAL DECISION MAKING: LOW - limited treatment options, no task modification necessary  REHAB POTENTIAL: Good  EVALUATION  COMPLEXITY: Low    PLAN:  OT FREQUENCY:  12 visits plus eval   OT DURATION: other: 7 weeks  PLANNED INTERVENTIONS: self care/ADL training, therapeutic exercise, therapeutic activity, neuromuscular re-education, manual therapy, passive range of motion, balance training, functional mobility training, splinting, ultrasound, paraffin, fluidotherapy, moist heat, patient/family education, cognitive remediation/compensation, visual/perceptual remediation/compensation, energy conservation, coping strategies training, and DME and/or AE instructions  RECOMMENDED OTHER SERVICES: n/a  CONSULTED AND AGREED WITH PLAN OF CARE: Patient and family member/caregiver  PLAN FOR NEXT SESSION: green putty, education regarding safety for working out, low range theraband HEP   Jazlynn Nemetz, OT 08/02/2022, 10:03 AM

## 2022-08-02 NOTE — Therapy (Signed)
OUTPATIENT PHYSICAL THERAPY NEURO TREATMENT   Patient Name: Scott Glenn MRN: [id number] DOB:07/18/1970, 52 y.o., male 25 Date: 08/02/2022   PCP: Geryl Rankins, NP  REFERRING PROVIDER: Talbert Cage, MD (hospitalist so will send cert to [place])  [place]:  PT End of Session - 08/02/22 0931     Visit Number 4    Number of Visits 17    Date for PT Re-Evaluation 09/19/22    Authorization Type [employer]    Authorization - Number of Visits 27    PT Start Time 0930    PT Stop Time 1015    PT Time Calculation (min) 45 min    Activity Tolerance Patient tolerated treatment well    Behavior During Therapy WFL for tasks assessed/performed              Past Medical History:  Diagnosis Date   Asthma    Chicken pox    Migraines    Seasonal allergies    History reviewed. No pertinent surgical history. Patient Active Problem List   Diagnosis Date Noted   Stroke [facility]) 07/15/2022   Acute focal neurological deficit 07/15/2022   Left hemiparesis ([place]) 07/15/2022   Vitamin D deficiency 09/25/2020   Elevated blood pressure reading without diagnosis of hypertension 09/23/2020   Acute non intractable tension-type headache 09/23/2020   Psychophysiological insomnia 09/23/2020   Stress 09/23/2020   Fatigue 09/23/2020   ALLERGIC RHINITIS WITH CONJUNCTIVITIS 05/02/2008   ASTHMA 05/02/2008   GASTRITIS 03/03/2007    ONSET DATE: 07/15/22 (per ED notes) Pt reports he doesn't remember anything from 07/14/22 that night.    REFERRING DIAG:  G81.90 (ICD-10-CM) - Hemiparesis ([place])    THERAPY DIAG:  Hemiplegia affecting left nondominant side, unspecified etiology, unspecified hemiplegia type (HCC)  Muscle weakness (generalized)  Unsteadiness on feet  Rationale for Evaluation and Treatment: Rehabilitation  SUBJECTIVE:                                                                                                                                                                                              SUBJECTIVE STATEMENT: Pt is starting back to work today.  Pt cleared by PCP to start back on light duty (lifting no greater than 5lbs at work) for next 2 weeks. Pt accompanied by: significant other [name])  PERTINENT HISTORY: PMH includes migraines, chicken pox, asthma.  PAIN:  Are you having pain? No PRECAUTIONS: Fall  WEIGHT BEARING RESTRICTIONS: No  FALLS: Has patient fallen in last 6 months? Yes. 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  -Treadmill training: Working on increasing speed, step length, and activity tolerance: 12 min total- 4 min warmup, 4 min 2.5 at 0% elevation, 4 min elevation 3-5%. - Dynamic standing balance: compliant surface, alt foot tapping with BLAZE pods, then squatting<> stands with alt hands taps on pods. -Dynamic gait training with changes in direction, progressing with holding 4 lbs dumbbells in each hand, progressing with SLS crossovers taps to target pod. - Bird/dog for balance, core/UE/LE strengthening.  PATIENT EDUCATION: Education details: HEP walking program and squats.  Addressed pt's questions about returning to work. Pt has follow up with PCP Feb 20th.  Pt lifts heavy items and squats for work.  Person educated: Patient and Spouse Education method: Explanation and Verbal cues Education comprehension: verbalized understanding  and needs further education  HOME EXERCISE PROGRAM: Walking Program: Begin walking for exercise for 10 minutes, 1-2 times/day, most days/week.   Progress your walking program by adding 1- minutes to your routine each week, as tolerated. Be sure to wear good walking shoes, walk in a safe environment and only progress to your tolerance.       WORK ON increasing speed safely  Access Code: [email or web address] URL: [email or web address] Date: 07/26/2022 Prepared by: Oneita Kras  Exercises - Deep Squat   - 1-2 x daily - 5 x weekly - 2-3 sets - 5 reps - 3 hold - Walking with Eyes Closed and Counter Support  - 1 x daily - 5 x weekly - 2 sets - 10 reps - Bird Dog  - 1-2 x daily - 5 x weekly - 2 sets - 5-10 reps - 3 hold  GOALS: Goals reviewed with patient? Yes  SHORT TERM GOALS: Target date: 08/21/22  Pt will be IND with initial HEP in order to indicate improved functional mobility and dec fall risk. Baseline:None initiated today  Goal status: INITIAL  2.  Pt will improve gait speed to >/=2.0 ft/sec w/ RW in order to indicate dec fall risk.  Baseline: 1.43 ft/sec with RW Goal status: INITIAL  3.  Pt will ambulate 150' with quad tip cane at S level in order to indicate more independent household ambulation.   Baseline: Ambulates with RW at this time  Goal status: INITIAL  4.  Will assess appropriateness of foot up brace on LLE to assist with foot clearance.  Baseline: Not assessed at this time  Goal status: INITIAL  5.  Pt will improve FGA to >/=17/30 in order to indicate dec fall risk.   Baseline: 11/30 Goal status: INITIAL  6.  Pt will negotiate up/down 4 steps with single rail/cane, up/down ramp and curb and ambulate x 500' outdoors over unlevel paved surfaces at S level with LRAD in order to indicate improved community mobility.   Baseline: Needs B rails for stairs and RW for all gait Goal status: INITIAL  LONG TERM GOALS: Target date: 09/19/22  Pt will be IND with final  HEP in order to indicate improved functional mobility and dec fall risk. Baseline: None initiated on eval Goal status: INITIAL  2.  Pt will improve gait speed to >/=2.62 ft/sec w/ cane in order to indicate dec fall risk.  Baseline: 1.44 ft/sec with RW Goal status: INITIAL  3.  Pt will improve FGA to >/=23/30 in order to indicate dec fall risk.   Baseline: 11/30 Goal status: INITIAL  4.  Pt will improve 5TSS to </=15 secs without UE support with equal LE WB in order to indicate dec fall risk and improved functional strength.   Baseline: 30.26 secs with heavy use of RLE Goal status: INITIAL  5.  Pt will negotiate up/down 4 steps with single rail, up/down ramp and curb and ambulate x 500' outdoors over unlevel paved surfaces at mod I level with cane in order to indicate improved community mobility.   Baseline: Needing B rails for stairs, RW for all gait  Goal status: INITIAL  6.  Pt will ambulate x 150' without AD at S level in order to simulate improved mobility in household setting.  Baseline: Needs RW for safety Goal status: INITIAL  ASSESSMENT:  CLINICAL IMPRESSION: Pt is progressing well with functional mobility, balance, strength, and activity tolerance.  Pt performed each high level task without LOB, no UE support.  Pt reported feeling challenged with Bird/dog balance/strengthening exercise, which was added to HEP.  OBJECTIVE IMPAIRMENTS: Abnormal gait, decreased balance, decreased mobility, decreased strength, impaired sensation, and impaired UE functional use.   ACTIVITY LIMITATIONS: carrying, lifting, standing, squatting, stairs, transfers, and locomotion level  PARTICIPATION LIMITATIONS: cleaning, laundry, personal finances, driving, shopping, community activity, occupation, and yard work  PERSONAL FACTORS: Behavior pattern, Profession, and 1-2 comorbidities: see above  are also affecting patient's functional outcome.   REHAB POTENTIAL: Excellent  CLINICAL DECISION  MAKING: Evolving/moderate complexity  EVALUATION COMPLEXITY: Moderate  PLAN:  PT FREQUENCY: 2x/week  PT DURATION: 8 weeks  PLANNED INTERVENTIONS: Therapeutic exercises, Therapeutic activity, Neuromuscular re-education, Balance training, Gait training, Patient/Family education, Self Care, Stair training, Vestibular training, Orthotic/Fit training, and DME instructions  PLAN FOR NEXT SESSION:  Ask about how work is going.  Add high level strengthening and  balance as able, gait without device, eyes closed, dynamic on compliant/unlevel surfaces, farmers carry, BOSU surge, lifting mechanics     Bjorn Loser, PTA  08/02/22, 1:15 PM

## 2022-08-04 NOTE — Therapy (Incomplete)
OUTPATIENT OCCUPATIONAL THERAPY NEURO TREATMENT  Patient Name: Scott Glenn MRN: [id number] DOB:1971/01/02, 52 y.o., male 18 Date: 08/04/2022  PCP: Sharyn Blitz REFERRING PROVIDER: Geryl Rankins, NP  END OF SESSION:    Past Medical History:  Diagnosis Date   Asthma    Chicken pox    Migraines    Seasonal allergies    No past surgical history on file. Patient Active Problem List   Diagnosis Date Noted   Stroke [facility]) 07/15/2022   Acute focal neurological deficit 07/15/2022   Left hemiparesis ([place]) 07/15/2022   Vitamin D deficiency 09/25/2020   Elevated blood pressure reading without diagnosis of hypertension 09/23/2020   Acute non intractable tension-type headache 09/23/2020   Psychophysiological insomnia 09/23/2020   Stress 09/23/2020   Fatigue 09/23/2020   ALLERGIC RHINITIS WITH CONJUNCTIVITIS 05/02/2008   ASTHMA 05/02/2008   GASTRITIS 03/03/2007    ONSET DATE: 07/15/22  REFERRING DIAG:  Diagnosis  G81.94 (ICD-10-CM) - Left hemiparesis ([place])    THERAPY DIAG:   No diagnosis found.  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 NIH:18. Head CT, MRI and EEG-unremarkable. Some concern for underlying functional etiology to his presentation given he was noted to raise his Lft arm and Lft leg up to assist with changing into hospital gown in ED. PMH includes migraines, chicken pox, asthma.  PRECAUTIONS: fall  WEIGHT BEARING RESTRICTIONS: No  PAIN:  Are you having pain? No  FALLS: Has patient fallen in last 6 months? 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  OBJECTIVE:   HAND DOMINANCE: Right  ADLs: Overall ADLs: mod I with all basic ADLS Transfers/ambulation related to ADLs:mod I   IADLs: Shopping: needs assist for transportation  Light housekeeping: washes dishes, cleaning Meal Prep: mod I per pt report, however he reports burning himself Community mobility: mod I Medication management: handles own meds Handwriting:  uses RUE  MOBILITY STATUS: Independent    ACTIVITY TOLERANCE: Activity tolerance: fatigues after 20-30 mins of activity on his feet  FUNCTIONAL OUTCOME MEASURES: Upper Extremity Functional Scale (UEFS): 86.25%  UPPER EXTREMITY ROM:  WFLS bilaterally    UPPER EXTREMITY MMT:     MMT Right eval Left eval  Shoulder flexion 5/5 4+5  Shoulder abduction    Shoulder adduction    Shoulder extension    Shoulder internal rotation    Shoulder external rotation    Middle trapezius    Lower trapezius    Elbow flexion 5/5 4+/5  Elbow extension 5/5 4+/5  Wrist flexion    Wrist extension    Wrist ulnar deviation    Wrist radial deviation    Wrist pronation    Wrist supination    (Blank rows = not tested)  HAND FUNCTION: Grip strength: Right: 116.8 lbs; Left: 60.8 lbs  COORDINATION: 9 Hole Peg test: Right: 30.66 sec; Left: 24.97 sec  SENSATION: Light touch: Impaired  for middle and tring finger of left hand    COGNITION: Overall cognitive status: Impaired, short term memory deficits, slowed processing  VISION: Subjective report: difficulty reading fine print Baseline vision: Wears glasses for reading  only   VISION ASSESSMENT: Not tested Reports difficulty with reading fine print, has readers but did not have them today        TODAY'S TREATMENT:                                                                                                                               ***  PATIENT EDUCATION: Education details: *** role of OT , potential  OT goals Person educated: Patient Education method: Explanation Education comprehension: verbalized understanding  HOME EXERCISE PROGRAM: N/a   GOALS: Goals reviewed with patient? Yes  SHORT TERM GOALS: Target date: 09/01/22  I with HEP for grip strength Baseline:dependent Goal status: INITIAL  2.  Pt will increase LUE grip stength by 10 # for increased functional use Baseline: RUE 116.8 #, LUE 60.8# Goal status: INITIAL  3.  Pt will verbalize understanding of sensory precautions for LUE. Baseline: numbness in middle and ring finer LUE Goal status: INITIAL  4.  Pt will verbalize understanding of recommendations for safe exercise Baseline: dependent Goal status: INITIAL     LONG TERM GOALS: Target date: 09/20/22  I with HEP for proximal strength Baseline: dependent Goal status: INITIAL  2.  Pt will demonstrate improved upper extremity function as evidenced by improve UEFS to 90% or better. Baseline: 86.25% Goal status: INITIAL  3.  Pt will perform simulated work activities modified independently without drops Baseline: on light duty, unable to lift heavier items due to risk for injury Goal status:   4.  Pt will report ability to perform home management/ functional activity in standing for 45 mins without rest or dyspnea Baseline: 20-30 mins prior to becoming fatigued/ winded Goal status: INITIAL    ASSESSMENT:  CLINICAL IMPRESSION: *** Patient is a 52 y.o. male who was seen today for occupational therapy evaluation for G81.94 (ICD-10-CM) - Left hemiparesis (HCC)Pt presented to hospital  1/11 with left sided weakness and difficulty speaking. NIH:18. Head CT, MRI and EEG-unremarkable. Some concern for underlying functional etiology to his presentation given he was noted to raise his Lft arm and Lft leg up to assist with changing into hospital gown in ED. PMH includes migraines, chicken pox, asthma.   PERFORMANCE DEFICITS: in functional skills including ADLs,  IADLs, coordination, dexterity, sensation, ROM, strength, flexibility, Fine motor control, Gross motor control, mobility, balance, endurance, decreased knowledge of precautions, decreased knowledge of use of DME, vision, and UE functional use, cognitive skills including memory, and psychosocial skills including coping strategies, environmental adaptation, habits, interpersonal interactions, and routines and behaviors.   IMPAIRMENTS: are limiting patient from ADLs, IADLs, work, leisure, and social participation.   CO-MORBIDITIES: may have co-morbidities  that affects occupational performance. Patient will benefit from skilled OT to address above impairments and improve overall function.  MODIFICATION OR ASSISTANCE TO COMPLETE EVALUATION: No modification of tasks or assist necessary to complete an evaluation.  OT OCCUPATIONAL PROFILE AND HISTORY: Problem focused assessment: Including review of records  relating to presenting problem.  CLINICAL DECISION MAKING: LOW - limited treatment options, no task modification necessary  REHAB POTENTIAL: Good  EVALUATION COMPLEXITY: Low    PLAN:  OT FREQUENCY:  12 visits plus eval   OT DURATION: other: 7 weeks  PLANNED INTERVENTIONS: self care/ADL training, therapeutic exercise, therapeutic activity, neuromuscular re-education, manual therapy, passive range of motion, balance training, functional mobility training, splinting, ultrasound, paraffin, fluidotherapy, moist heat, patient/family education, cognitive remediation/compensation, visual/perceptual remediation/compensation, energy conservation, coping strategies training, and DME and/or AE instructions  RECOMMENDED OTHER SERVICES: n/a  CONSULTED AND AGREED WITH PLAN OF CARE: Patient and family member/caregiver  PLAN FOR NEXT SESSION: *** green putty, education regarding safety for working out, low range theraband HEP   Scott Glenn, OTR/L 08/04/2022, 5:15 PM

## 2022-08-05 ENCOUNTER — Ambulatory Visit: Payer: Medicaid Other | Attending: Family Medicine | Admitting: Occupational Therapy

## 2022-08-05 ENCOUNTER — Ambulatory Visit: Payer: Medicaid Other | Admitting: Physical Therapy

## 2022-08-05 ENCOUNTER — Encounter: Payer: Self-pay | Admitting: Physical Therapy

## 2022-08-05 VITALS — BP 153/101 | HR 65

## 2022-08-05 DIAGNOSIS — R41844 Frontal lobe and executive function deficit: Secondary | ICD-10-CM | POA: Insufficient documentation

## 2022-08-05 DIAGNOSIS — M6281 Muscle weakness (generalized): Secondary | ICD-10-CM | POA: Diagnosis present

## 2022-08-05 DIAGNOSIS — G8194 Hemiplegia, unspecified affecting left nondominant side: Secondary | ICD-10-CM | POA: Insufficient documentation

## 2022-08-05 DIAGNOSIS — R2681 Unsteadiness on feet: Secondary | ICD-10-CM | POA: Diagnosis present

## 2022-08-05 DIAGNOSIS — R41840 Attention and concentration deficit: Secondary | ICD-10-CM | POA: Diagnosis present

## 2022-08-05 NOTE — Therapy (Addendum)
OUTPATIENT OCCUPATIONAL THERAPY NEURO Treatment  Patient Name: Scott Glenn MRN: [id number] DOB:07-02-1971, 51 y.o., male Today's Date: 08/05/2022  PCP: Sharyn Blitz REFERRING PROVIDER: Geryl Rankins, NP  END OF SESSION:  OT End of Session - 08/05/22 1052     Visit Number 2    Number of Visits 13    Date for OT Re-Evaluation 09/20/22    Authorization Type [employer] Medicaid    Authorization Time Period 7 weeks- 12 visits plus eval over 7 weeks    OT Start Time 1018    OT Stop Time 1100    OT Time Calculation (min) 42 min    Activity Tolerance Patient tolerated treatment well    Behavior During Therapy WFL for tasks assessed/performed              Past Medical History:  Diagnosis Date   Asthma    Chicken pox    Migraines    Seasonal allergies    No past surgical history on file. Patient Active Problem List   Diagnosis Date Noted   Stroke ([place]) 07/15/2022   Acute focal neurological deficit 07/15/2022   Left hemiparesis ([place]) 07/15/2022   Vitamin D deficiency 09/25/2020   Elevated blood pressure reading without diagnosis of hypertension 09/23/2020   Acute non intractable tension-type headache 09/23/2020   Psychophysiological insomnia 09/23/2020   Stress 09/23/2020   Fatigue 09/23/2020   ALLERGIC RHINITIS WITH CONJUNCTIVITIS 05/02/2008   ASTHMA 05/02/2008   GASTRITIS 03/03/2007    ONSET DATE: 07/15/22  REFERRING DIAG:  Diagnosis  G81.94 (ICD-10-CM) - Left hemiparesis (HCC)    THERAPY DIAG:   Muscle weakness (generalized)  Hemiplegia affecting left nondominant side, unspecified etiology, unspecified hemiplegia type ([place])  Unsteadiness on feet  Attention and concentration deficit  Rationale for Evaluation and Treatment: Rehabilitation  SUBJECTIVE:   SUBJECTIVE STATEMENT: Pt reports elbow pain Pt accompanied by: significant other  PERTINENT HISTORY: Patient is a 52 y/o male who presents on 1/11 with left sided weakness and difficulty speaking.  NIH:18. Head CT, MRI and EEG-unremarkable. Some concern for underlying functional etiology to his presentation given he was noted to raise his Lft arm and Lft leg up to assist with changing into hospital gown in ED. PMH includes migraines, chicken pox, asthma.  PRECAUTIONS: fall  WEIGHT BEARING RESTRICTIONS: No    PAIN:  Are you having pain? Yes: NPRS scale: 4/10 Pain location: elbow L Pain description: ache Aggravating factors: unknown Relieving factors: massage   FALLS: Has patient fallen in last 6 months? 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  OBJECTIVE:   HAND DOMINANCE: Right  ADLs: Overall ADLs: mod I with all basic ADLS Transfers/ambulation related to ADLs:mod I   IADLs: Shopping: needs assist for transportation  Light housekeeping: washes dishes, cleaning Meal Prep: mod I per pt report, however he reports burning himself Community mobility: mod I Medication management: handles own meds Handwriting:  uses RUE  MOBILITY STATUS: Independent    ACTIVITY TOLERANCE: Activity tolerance: fatigues after 20-30 mins of activity on his feet  FUNCTIONAL OUTCOME MEASURES: Upper Extremity Functional Scale (UEFS): 86.25%  UPPER EXTREMITY ROM:  WFLS bilaterally    UPPER EXTREMITY MMT:     MMT Right eval Left eval  Shoulder flexion 5/5 4+5  Shoulder abduction    Shoulder adduction    Shoulder extension  Shoulder internal rotation    Shoulder external rotation    Middle trapezius    Lower trapezius    Elbow flexion 5/5 4+/5  Elbow extension 5/5 4+/5  Wrist flexion    Wrist extension    Wrist ulnar deviation    Wrist radial deviation    Wrist pronation    Wrist supination    (Blank rows = not tested)  HAND FUNCTION: Grip strength: Right: 116.8 lbs; Left: 60.8  lbs  COORDINATION: 9 Hole Peg test: Right: 30.66 sec; Left: 24.97 sec  SENSATION: Light touch: Impaired  for middle and tring finger of left hand    COGNITION: Overall cognitive status: Impaired, short term memory deficits, slowed processing  VISION: Subjective report: difficulty reading fine print Baseline vision: Wears glasses for reading only   VISION ASSESSMENT: Not tested Reports difficulty with reading fine print, has readers but did not have them today        TODAY'S TREATMENT:                                                                                                                              DATE: 08/05/22- Pt was instructed in green putty HEP  Then green theraband HEP, 15 reps each, min v.c. Arm bike x 6 mins level 4 for conditioning. Gripper set at level 4 for sustained grip to pick up 1 inch blocks.      BP: initially 144/96, following exercise: 135/93  PATIENT EDUCATION: Education details: green putty HEP, green theraband HEP- see pt instructions Person educated: Patient, wife Education method: Explanation, demonstration, min v.c Education comprehension: verbalized understanding, returned demonstration  HOME EXERCISE PROGRAM: N/a   GOALS: Goals reviewed with patient? Yes  SHORT TERM GOALS: Target date: 09/01/22  I with HEP for grip strength Baseline:dependent Goal status: INITIAL  2.  Pt will increase LUE grip stength by 10 # for increased functional use Baseline: RUE 116.8 #, LUE 60.8# Goal status: INITIAL  3.  Pt will verbalize understanding of sensory precautions for LUE. Baseline: numbness in middle and ring finger LUE Goal status: INITIAL  4.  Pt will verbalize understanding of recommendations for safe exercise Baseline: dependent Goal status: INITIAL     LONG TERM GOALS: Target date: 09/20/22  I with HEP for proximal strength Baseline: dependent Goal status: INITIAL  2.  Pt will demonstrate improved upper extremity  function as evidenced by improve UEFS to 90% or better. Baseline: 86.25% Goal status: INITIAL  3.  Pt will perform simulated work activities modified independently without drops Baseline: on light duty, unable to lift heavier items due to risk for injury Goal status:   4.  Pt will report ability to perform home management/ functional activity in standing for 45 mins without rest or dyspnea Baseline: 20-30 mins prior to becoming fatigued/ winded Goal status: INITIAL    ASSESSMENT:  CLINICAL IMPRESSION: Pt is progressing towards goals. He demonstrates understanding of initial HEP. PERFORMANCE DEFICITS: in  functional skills including ADLs, IADLs, coordination, dexterity, sensation, ROM, strength, flexibility, Fine motor control, Gross motor control, mobility, balance, endurance, decreased knowledge of precautions, decreased knowledge of use of DME, vision, and UE functional use, cognitive skills including memory, and psychosocial skills including coping strategies, environmental adaptation, habits, interpersonal interactions, and routines and behaviors.   IMPAIRMENTS: are limiting patient from ADLs, IADLs, work, leisure, and social participation.   CO-MORBIDITIES: may have co-morbidities  that affects occupational performance. Patient will benefit from skilled OT to address above impairments and improve overall function.  MODIFICATION OR ASSISTANCE TO COMPLETE EVALUATION: No modification of tasks or assist necessary to complete an evaluation.  OT OCCUPATIONAL PROFILE AND HISTORY: Problem focused assessment: Including review of records relating to presenting problem.  CLINICAL DECISION MAKING: LOW - limited treatment options, no task modification necessary  REHAB POTENTIAL: Good  EVALUATION COMPLEXITY: Low    PLAN:  OT FREQUENCY:  12 visits plus eval   OT DURATION: other: 7 weeks  PLANNED INTERVENTIONS: self care/ADL training, therapeutic exercise, therapeutic activity,  neuromuscular re-education, manual therapy, passive range of motion, balance training, functional mobility training, splinting, ultrasound, paraffin, fluidotherapy, moist heat, patient/family education, cognitive remediation/compensation, visual/perceptual remediation/compensation, energy conservation, coping strategies training, and DME and/or AE instructions  RECOMMENDED OTHER SERVICES: n/a  CONSULTED AND AGREED WITH PLAN OF CARE: Patient and family member/caregiver  PLAN FOR NEXT SESSION:continue to address grip and proximal strength   Taimane Stimmel, OT 08/05/2022, 11:11 AM

## 2022-08-05 NOTE — Therapy (Signed)
[facility] [address] [place], [place], [address] Phone: [phone]   Fax:  [phone]  Patient Details - ARRIVED NO CHARGE Name: Scott Glenn MRN: [id number] Date of Birth: Dec 13, 1970 Referring Provider:  Gildardo Pounds, NP  Encounter Date: 08/05/2022  Handoff from OT w/ communication that diastolic had trended high within parameters during OT session around 96.  PT assessed vitals as below: Today's Vitals   08/05/22 1101 08/05/22 1105  BP: (!) 164/109 (!) 153/101  Pulse: 63 65   PT spoke with OT in regards to any notable BP issues in session, OT states patient took an intense phone call just before PT, unsure if this spiked BP.  Pt educated on limits for physical activity, s/s requiring PCP follow-up vs ED, provided [employer] BP log as pt has a monitor at home.  Discussed having patient log BP regularly and take log to PCP appt if trending above 120/80 consistently.  Both pt and wife endorse this is abnormal for pt, but verbalize understanding.  Bary Richard, PT, DPT 08/05/2022, 11:18 AM  [facility] [address] [place], [place], [address] Phone: [phone]   Fax:  [phone]

## 2022-08-05 NOTE — Patient Instructions (Signed)
1. Grip Strengthening (Resistive Putty)   Squeeze putty using thumb and all fingers. Repeat _20___ times. Do __1-2__ sessions per day.   2. Roll putty into tube on table and pinch between first two fingers and thumb x 10 reps. Do 1-2 sessions per day     Copyright  VHI. All rights reserved.      Strengthening: Resisted Flexion   Hold tubing with __left___ arm(s) at side. Pull forward and up. Move shoulder through pain-free range of motion. Repeat __10__ times per set.  Do _1-2_ sessions per day , every other day   Strengthening: Resisted Extension   Hold tubing in _left____ hand(s), arm forward. Pull arm back, elbow straight. Repeat _10___ times per set. Do _1-2___ sessions per day, every other day.   Resisted Horizontal Abduction: Bilateral   Sit or stand, tubing in both hands, arms out in front. Keeping arms straight, pinch shoulder blades together and stretch arms out. Repeat _10___ times per set. Do _1-2___ sessions per day, every other day.   Elbow Flexion: Resisted   With tubing held in __left____ hand(s) and other end secured under foot, curl arm up as far as possible. Repeat _10___ times per set. Do _1-2___ sessions per day, every other day.    Elbow Extension: Resisted   Sit in chair with resistive band hold with right hand and __left _____ elbow bent. Straighten elbow. Repeat _10___ times per set.  Do _1-2___ sessions per day, every other day.   Copyright  VHI. All rights reserved.

## 2022-08-09 ENCOUNTER — Encounter: Payer: Self-pay | Admitting: Physical Therapy

## 2022-08-09 ENCOUNTER — Ambulatory Visit: Payer: Medicaid Other | Admitting: Occupational Therapy

## 2022-08-09 ENCOUNTER — Ambulatory Visit: Payer: Medicaid Other | Admitting: Physical Therapy

## 2022-08-09 VITALS — BP 123/81 | HR 68

## 2022-08-09 DIAGNOSIS — G8194 Hemiplegia, unspecified affecting left nondominant side: Secondary | ICD-10-CM

## 2022-08-09 DIAGNOSIS — R41840 Attention and concentration deficit: Secondary | ICD-10-CM

## 2022-08-09 DIAGNOSIS — R41844 Frontal lobe and executive function deficit: Secondary | ICD-10-CM

## 2022-08-09 DIAGNOSIS — R2681 Unsteadiness on feet: Secondary | ICD-10-CM

## 2022-08-09 DIAGNOSIS — M6281 Muscle weakness (generalized): Secondary | ICD-10-CM

## 2022-08-09 NOTE — Therapy (Signed)
OUTPATIENT OCCUPATIONAL THERAPY NEURO Treatment  Patient Name: Scott Glenn MRN: [id number] DOB:October 30, 1970, 52 y.o., male Today's Date: 08/09/2022  PCP: Sharyn Blitz REFERRING PROVIDER: Geryl Rankins, NP  END OF SESSION:  OT End of Session - 08/09/22 0855     Visit Number 3    Number of Visits 13    Date for OT Re-Evaluation 09/20/22    Authorization Type [employer] Medicaid    Authorization Time Period 7 weeks- 12 visits plus eval over 7 weeks    OT Start Time 0854    OT Stop Time 0930    OT Time Calculation (min) 36 min    Activity Tolerance Patient tolerated treatment well    Behavior During Therapy WFL for tasks assessed/performed              Past Medical History:  Diagnosis Date   Asthma    Chicken pox    Migraines    Seasonal allergies    No past surgical history on file. Patient Active Problem List   Diagnosis Date Noted   Stroke ([place]) 07/15/2022   Acute focal neurological deficit 07/15/2022   Left hemiparesis ([place]) 07/15/2022   Vitamin D deficiency 09/25/2020   Elevated blood pressure reading without diagnosis of hypertension 09/23/2020   Acute non intractable tension-type headache 09/23/2020   Psychophysiological insomnia 09/23/2020   Stress 09/23/2020   Fatigue 09/23/2020   ALLERGIC RHINITIS WITH CONJUNCTIVITIS 05/02/2008   ASTHMA 05/02/2008   GASTRITIS 03/03/2007    ONSET DATE: 07/15/22  REFERRING DIAG:  Diagnosis  G81.94 (ICD-10-CM) - Left hemiparesis (HCC)    THERAPY DIAG:   Muscle weakness (generalized)  Hemiplegia affecting left nondominant side, unspecified etiology, unspecified hemiplegia type ([place])  Unsteadiness on feet  Attention and concentration deficit  Frontal lobe and executive function deficit  Rationale for Evaluation and Treatment: Rehabilitation  SUBJECTIVE:   SUBJECTIVE STATEMENT: Pt reports green band has gotten too easy Pt accompanied by: significant other  PERTINENT HISTORY: Patient is a 52 y/o male who  presents on 1/11 with left sided weakness and difficulty speaking. NIH:18. Head CT, MRI and EEG-unremarkable. Some concern for underlying functional etiology to his presentation given he was noted to raise his Lft arm and Lft leg up to assist with changing into hospital gown in ED. PMH includes migraines, chicken pox, asthma.  PRECAUTIONS: fall  WEIGHT BEARING RESTRICTIONS: No    PAIN:  Are you having pain? Yes: NPRS scale: 4/10 Pain location: elbow L Pain description: ache Aggravating factors: unknown Relieving factors: massage   FALLS: Has patient fallen in last 6 months? 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  OBJECTIVE:   HAND DOMINANCE: Right  ADLs: Overall ADLs: mod I with all basic ADLS Transfers/ambulation related to ADLs:mod I   IADLs: Shopping: needs assist for transportation  Light housekeeping: washes dishes, cleaning Meal Prep: mod I per pt report, however he reports burning himself Community mobility: mod I Medication management: handles own meds Handwriting:  uses RUE  MOBILITY STATUS: Independent    ACTIVITY TOLERANCE: Activity tolerance: fatigues after 20-30 mins of activity on his feet  FUNCTIONAL OUTCOME MEASURES: Upper Extremity Functional Scale (UEFS): 86.25%  UPPER EXTREMITY ROM:  WFLS bilaterally    UPPER EXTREMITY MMT:     MMT Right eval Left eval  Shoulder flexion 5/5 4+5  Shoulder abduction  Shoulder adduction    Shoulder extension    Shoulder internal rotation    Shoulder external rotation    Middle trapezius    Lower trapezius    Elbow flexion 5/5 4+/5  Elbow extension 5/5 4+/5  Wrist flexion    Wrist extension    Wrist ulnar deviation    Wrist radial deviation    Wrist pronation    Wrist supination    (Blank rows = not tested)  HAND  FUNCTION: Grip strength: Right: 116.8 lbs; Left: 60.8 lbs  COORDINATION: 9 Hole Peg test: Right: 30.66 sec; Left: 24.97 sec  SENSATION: Light touch: Impaired  for middle and tring finger of left hand    COGNITION: Overall cognitive status: Impaired, short term memory deficits, slowed processing  VISION: Subjective report: difficulty reading fine print Baseline vision: Wears glasses for reading only   VISION ASSESSMENT: Not tested Reports difficulty with reading fine print, has readers but did not have them today        TODAY'S TREATMENT:                                                                                                                              DATE: 08/09/22- Upgraded to blue theraband HEP, 15 reps each, min v.c. Arm bike x 8 mins level 4 for conditioning, 4 mins  each direction Graded clothespins 1-8# of pinch for sustained pinch and functional reach, placing and removing, several rest breaks required Ambulating 115" while carrying a crate with 20 lbs, then 30 lbs then 40 lbs with rest break in between each trip of 115" Then lifting the 50 lbs crate x 5 reps from floor to mat, min v.c for techniques         PATIENT EDUCATION: Education details: blue theraband HEP, exercises issued last visit upgraded to blue Person educated: Patient, wife Education method: Explanation, demonstration, min v.c Education comprehension: verbalized understanding, returned demonstration  HOME EXERCISE PROGRAM: N/a   GOALS: Goals reviewed with patient? Yes  SHORT TERM GOALS: Target date: 09/01/22  I with HEP for grip strength Baseline:dependent Goal status: INITIAL  2.  Pt will increase LUE grip stength by 10 # for increased functional use Baseline: RUE 116.8 #, LUE 60.8# Goal status: INITIAL  3.  Pt will verbalize understanding of sensory precautions for LUE. Baseline: numbness in middle and ring finger LUE Goal status: INITIAL  4.  Pt will verbalize  understanding of recommendations for safe exercise Baseline: dependent Goal status: INITIAL     LONG TERM GOALS: Target date: 09/20/22  I with HEP for proximal strength Baseline: dependent Goal status: INITIAL  2.  Pt will demonstrate improved upper extremity function as evidenced by improve UEFS to 90% or better. Baseline: 86.25% Goal status: INITIAL  3.  Pt will perform simulated work activities modified independently without drops Baseline: on light duty, unable to lift heavier items due to risk for injury Goal status:   4.  Pt  will report ability to perform home management/ functional activity in standing for 45 mins without rest or dyspnea Baseline: 20-30 mins prior to becoming fatigued/ winded Goal status: INITIAL    ASSESSMENT:  CLINICAL IMPRESSION: Pt is progressing towards goals. He demonstrates understanding of blue theraband HEP. PERFORMANCE DEFICITS: in functional skills including ADLs, IADLs, coordination, dexterity, sensation, ROM, strength, flexibility, Fine motor control, Gross motor control, mobility, balance, endurance, decreased knowledge of precautions, decreased knowledge of use of DME, vision, and UE functional use, cognitive skills including memory, and psychosocial skills including coping strategies, environmental adaptation, habits, interpersonal interactions, and routines and behaviors.   IMPAIRMENTS: are limiting patient from ADLs, IADLs, work, leisure, and social participation.   CO-MORBIDITIES: may have co-morbidities  that affects occupational performance. Patient will benefit from skilled OT to address above impairments and improve overall function.  MODIFICATION OR ASSISTANCE TO COMPLETE EVALUATION: No modification of tasks or assist necessary to complete an evaluation.  OT OCCUPATIONAL PROFILE AND HISTORY: Problem focused assessment: Including review of records relating to presenting problem.  CLINICAL DECISION MAKING: LOW - limited treatment  options, no task modification necessary  REHAB POTENTIAL: Good  EVALUATION COMPLEXITY: Low    PLAN:  OT FREQUENCY:  12 visits plus eval   OT DURATION: other: 7 weeks  PLANNED INTERVENTIONS: self care/ADL training, therapeutic exercise, therapeutic activity, neuromuscular re-education, manual therapy, passive range of motion, balance training, functional mobility training, splinting, ultrasound, paraffin, fluidotherapy, moist heat, patient/family education, cognitive remediation/compensation, visual/perceptual remediation/compensation, energy conservation, coping strategies training, and DME and/or AE instructions  RECOMMENDED OTHER SERVICES: n/a  CONSULTED AND AGREED WITH PLAN OF CARE: Patient and family member/caregiver  PLAN FOR NEXT SESSION:continue to address work activities, grip and proximal strength, work towards unmet goals   Laisha Rau, OT 08/09/2022, 9:07 AM

## 2022-08-09 NOTE — Therapy (Signed)
OUTPATIENT PHYSICAL THERAPY NEURO TREATMENT   Patient Name: RAMAR NOBREGA MRN: [id number] DOB:[birth year], 52 y.o., male 27 Date: 08/09/2022   PCP: Geryl Rankins, NP  REFERRING PROVIDER: Talbert Cage, MD (hospitalist so will send cert to [place])  [place]:  PT End of Session - 08/09/22 0937     Visit Number 5    Number of Visits 17    Date for PT Re-Evaluation 09/19/22    Authorization Type [employer] Medicaid    Authorization - Number of Visits 27    PT Start Time [phone]    PT Stop Time 1015    PT Time Calculation (min) 39 min    Equipment Utilized During Treatment Gait belt    Activity Tolerance Patient tolerated treatment well    Behavior During Therapy WFL for tasks assessed/performed              Past Medical History:  Diagnosis Date   Asthma    Chicken pox    Migraines    Seasonal allergies    History reviewed. No pertinent surgical history. Patient Active Problem List   Diagnosis Date Noted   Stroke [facility]) 07/15/2022   Acute focal neurological deficit 07/15/2022   Left hemiparesis ([place]) 07/15/2022   Vitamin D deficiency 09/25/2020   Elevated blood pressure reading without diagnosis of hypertension 09/23/2020   Acute non intractable tension-type headache 09/23/2020   Psychophysiological insomnia 09/23/2020   Stress 09/23/2020   Fatigue 09/23/2020   ALLERGIC RHINITIS WITH CONJUNCTIVITIS 05/02/2008   ASTHMA 05/02/2008   GASTRITIS 03/03/2007    ONSET DATE: 07/15/22 (per ED notes) Pt reports he doesn't remember anything from 07/14/22 that night.    REFERRING DIAG:  G81.90 (ICD-10-CM) - Hemiparesis ([place])    THERAPY DIAG:  Muscle weakness (generalized)  Hemiplegia affecting left nondominant side, unspecified etiology, unspecified hemiplegia type (HCC)  Unsteadiness on feet  Attention and concentration deficit  Frontal lobe and executive function deficit  Rationale for Evaluation and Treatment: Rehabilitation  SUBJECTIVE:                                                                                                                                                                                              SUBJECTIVE STATEMENT: Pt reports to session stating he is doing well. Doesn't have to many LE complaints and no falls. Patient reports weakness when he has been sitting down for a long time or just getting up in the morning but denies dizziness/lightheadness. Has been reporting BP readings at home but forgot to bring it to today's session.   Pt accompanied by: self  PERTINENT HISTORY: PMH includes  migraines, chicken pox, asthma.  PAIN:  Are you having pain? No PRECAUTIONS: Fall  WEIGHT BEARING RESTRICTIONS: No  FALLS: Has patient fallen in last 6 months? Yes. 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  TODAY'S TREATMENT:                                                                                                                               Therex: Hip hinging required max visual/verbal and min tactile cues 3 x 10  Hip hinging with tidal tank 15# for pertubation deadlift form 2 x 10 Hip hinging with tidal tank 15# for pertubation standing on foam pad 2 x 10 (SBA) Tidal tank pickups from ground with correct lifting form 15# 2 x 10  NMR:  Grape vine walking overground 2 X 10' (SBA) Grape vine with modified SLS hold 2 x 10' (SBA)  Between // bars with SBA BOSU ball round side  down squat with hip hinge while maintaining balance 3 x 8 BOSU ball round side down EC balance 3 x 30" Single leg balance on foam EC 3 x 10" bil   PATIENT EDUCATION: Education details: HEP walking program and squats; education on hip hinging  Person educated: Patient and Spouse Education method: Explanation and Verbal cues Education comprehension: verbalized understanding and needs further education  HOME EXERCISE PROGRAM: Walking Program: Begin walking for exercise for 10 minutes, 1-2 times/day, most days/week.   Progress your walking program by adding 1- minutes to your routine each week, as tolerated. Be sure to wear good walking shoes, walk in a safe environment and only progress to your tolerance.       WORK ON increasing speed safely  Access Code: [email or web address] URL: [email or web address] Date: 07/26/2022 Prepared by: Oneita Kras  Exercises - Deep Squat   - 1-2 x daily - 5 x weekly - 2-3 sets - 5 reps - 3 hold - Walking with Eyes Closed and Counter Support  - 1 x daily - 5 x weekly - 2 sets - 10 reps - Bird Dog  - 1-2 x daily - 5 x weekly - 2 sets - 5-10 reps - 3 hold  GOALS: Goals reviewed with patient? Yes  SHORT TERM GOALS: Target date: 08/21/22  Pt will be IND with initial HEP in order to indicate improved functional mobility and dec fall risk. Baseline:None initiated today  Goal status: INITIAL  2.  Pt will improve gait  speed to >/=2.0 ft/sec w/ RW in order to indicate dec fall risk.  Baseline: 1.43 ft/sec with RW Goal status: INITIAL  3.  Pt will ambulate 150' with quad tip cane at S level in order to indicate more independent household ambulation.   Baseline: Ambulates with RW at this time  Goal status: INITIAL  4.  Will assess appropriateness of foot up brace on LLE to assist with foot clearance.  Baseline: Not assessed at this time  Goal status: INITIAL  5.  Pt will improve FGA to >/=17/30 in order to indicate dec fall risk.   Baseline:  11/30 Goal status: INITIAL  6.  Pt will negotiate up/down 4 steps with single rail/cane, up/down ramp and curb and ambulate x 500' outdoors over unlevel paved surfaces at S level with LRAD in order to indicate improved community mobility.   Baseline: Needs B rails for stairs and RW for all gait Goal status: INITIAL  LONG TERM GOALS: Target date: 09/19/22  Pt will be IND with final HEP in order to indicate improved functional mobility and dec fall risk. Baseline: None initiated on eval Goal status: INITIAL  2.  Pt will improve gait speed to >/=2.62 ft/sec w/ cane in order to indicate dec fall risk.  Baseline: 1.44 ft/sec with RW Goal status: INITIAL  3.  Pt will improve FGA to >/=23/30 in order to indicate dec fall risk.   Baseline: 11/30 Goal status: INITIAL  4.  Pt will improve 5TSS to </=15 secs without UE support with equal LE WB in order to indicate dec fall risk and improved functional strength.   Baseline: 30.26 secs with heavy use of RLE Goal status: INITIAL  5.  Pt will negotiate up/down 4 steps with single rail, up/down ramp and curb and ambulate x 500' outdoors over unlevel paved surfaces at mod I level with cane in order to indicate improved community mobility.   Baseline: Needing B rails for stairs, RW for all gait  Goal status: INITIAL  6.  Pt will ambulate x 150' without AD at S level in order to simulate improved mobility in household setting.  Baseline: Needs RW for safety Goal status: INITIAL  ASSESSMENT:  CLINICAL IMPRESSION: Pt continues to demonstrate appropriate high level balance without use of AD. Spent large portion of session reinforcing hip hinging for functional tasks. Patient will likely require reinforcement in future session to maintain. Patient presents with no notable hemiplegia like symptoms during session. Continue POC.   OBJECTIVE IMPAIRMENTS: Abnormal gait, decreased balance, decreased mobility, decreased strength, impaired sensation, and  impaired UE functional use.   ACTIVITY LIMITATIONS: carrying, lifting, standing, squatting, stairs, transfers, and locomotion level  PARTICIPATION LIMITATIONS: cleaning, laundry, personal finances, driving, shopping, community activity, occupation, and yard work  PERSONAL FACTORS: Behavior pattern, Profession, and 1-2 comorbidities: see above  are also affecting patient's functional outcome.   REHAB POTENTIAL: Excellent  CLINICAL DECISION MAKING: Evolving/moderate complexity  EVALUATION COMPLEXITY: Moderate  PLAN:  PT FREQUENCY: 2x/week  PT DURATION: 8 weeks  PLANNED INTERVENTIONS: Therapeutic exercises, Therapeutic activity, Neuromuscular re-education, Balance training, Gait training, Patient/Family education, Self Care, Stair training, Vestibular training, Orthotic/Fit training, and DME instructions  PLAN FOR NEXT SESSION:  Ask about how work is going.  Add high level strengthening and  balance as able, gait without device, eyes closed, dynamic on compliant/unlevel surfaces, farmers carry, BOSU surge, lifting mechanics; up and down tasks with blaze pods, circuit training   Malachi Carl, PT, DPT 08/09/22, 12:26 PM

## 2022-08-13 ENCOUNTER — Ambulatory Visit: Payer: Medicaid Other | Admitting: Physical Therapy

## 2022-08-13 ENCOUNTER — Telehealth: Payer: Self-pay | Admitting: Physical Therapy

## 2022-08-13 DIAGNOSIS — M6281 Muscle weakness (generalized): Secondary | ICD-10-CM | POA: Diagnosis not present

## 2022-08-13 DIAGNOSIS — R2681 Unsteadiness on feet: Secondary | ICD-10-CM

## 2022-08-13 DIAGNOSIS — G8194 Hemiplegia, unspecified affecting left nondominant side: Secondary | ICD-10-CM

## 2022-08-13 NOTE — Telephone Encounter (Signed)
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 Based on his performance in physical therapy this date he does have ongoing decreased L grip strength and decreased tolerance for lifting and decreased endurance. He is able to lift up to 50# and carry this weight during therapy sessions but needs several days of recovery after performing this task. Due to his decreased L hand grip strength and his decreased endurance regarding lifting/carrying weight it does not seem like he would be able to resume his full work duties until he completes further strengthening and physical conditioning. If you agree could you extend his light duty restrictions for work?  Thank you, Excell Seltzer, PT, DPT, [facility] [address] [place], [place]  [address] Phone:  [phone] Fax:  [phone]

## 2022-08-13 NOTE — Therapy (Signed)
OUTPATIENT PHYSICAL THERAPY NEURO TREATMENT   Patient Name: Scott Glenn MRN: [id number] DOB:1971-05-30, 52 y.o., male 81 Date: 08/13/2022   PCP: Geryl Rankins, NP  REFERRING PROVIDER: Talbert Cage, MD (hospitalist so will send cert to [place])  [place]:  PT End of Session - 08/13/22 0932     Visit Number 6    Number of Visits 17    Date for PT Re-Evaluation 09/19/22    Authorization Type [employer] Medicaid    Authorization - Number of Visits 27    PT Start Time 0930    PT Stop Time 1015    PT Time Calculation (min) 45 min    Equipment Utilized During Treatment Gait belt    Activity Tolerance Patient tolerated treatment well    Behavior During Therapy WFL for tasks assessed/performed               Past Medical History:  Diagnosis Date   Asthma    Chicken pox    Migraines    Seasonal allergies    No past surgical history on file. Patient Active Problem List   Diagnosis Date Noted   Stroke [facility]) 07/15/2022   Acute focal neurological deficit 07/15/2022   Left hemiparesis ([place]) 07/15/2022   Vitamin D deficiency 09/25/2020   Elevated blood pressure reading without diagnosis of hypertension 09/23/2020   Acute non intractable tension-type headache 09/23/2020   Psychophysiological insomnia 09/23/2020   Stress 09/23/2020   Fatigue 09/23/2020   ALLERGIC RHINITIS WITH CONJUNCTIVITIS 05/02/2008   ASTHMA 05/02/2008   GASTRITIS 03/03/2007    ONSET DATE: 07/15/22 (per ED notes) Pt reports he doesn't remember anything from 07/14/22 that night.    REFERRING DIAG:  G81.90 (ICD-10-CM) - Hemiparesis ([place])    THERAPY DIAG:  Muscle weakness (generalized)  Hemiplegia affecting left nondominant side, unspecified etiology, unspecified hemiplegia type ([place])  Unsteadiness on feet  Rationale for Evaluation and Treatment: Rehabilitation  SUBJECTIVE:                                                                                                                                                                                              SUBJECTIVE STATEMENT: Pt reports no falls or acute changes since last visit, no pain today but was sore after last therapy session for several days. Pt reports ongoing weakness in fingers of L hand and numbness in fingers of L hand and in L heel. Pt reports that overall his numbness is improving. Pt reports that he remains on light duty at work (5# lifting restriction) but his job involves lifting furniture and appliances, only equipment he uses is a  hand truck.  Pt accompanied by: self  PERTINENT HISTORY: PMH includes migraines, chicken pox, asthma.  PAIN:  Are you having pain? No PRECAUTIONS: Fall  WEIGHT BEARING RESTRICTIONS: No  FALLS: Has patient fallen in last 6 months? Yes. 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 BP initially elevated but patient was talking; rechecked without patient talking and was within safe range.  TODAY'S TREATMENT:                                                                                                                               Therex: In quadruped on mat table: Alt UE lifts Alt LE lifts Progression to alt UE and LE lifts (bird dogs)  Modified push up position push-ups x 10 reps (difficult)  Farmers carry with 8# dumbells x 230 ft (easy)  Leg press for LE strengthening: 140 # BLE 3 x 10-15 reps 80# RLE/LLE, 3 x 10 reps each  Weighted sit to stands with 10# slam ball x 10 reps x 10 reps with punch-out  Added appropriate exercises to HEP, see bolded below. Encouraged  pt to use a milk jug or gallon jug of water as weight during weighted sit to stands and for modified farmer's carry if unable to purchase a KB for use at home.  Reassessed UE grip strength: R: 97 lbs L: 63 lbs   PATIENT EDUCATION: Education details: continue HEP, added to/modified HEP; education on importance of continuing physical conditioning and strength training to work towards a safe return to full work duties Person educated: Patient and Spouse Education method: Consulting civil engineer, Verbal cues, and Handouts Education comprehension: verbalized understanding and needs further education  HOME EXERCISE PROGRAM: Walking Program: Begin walking for exercise for 10 minutes, 1-2 times/day, most days/week.   Progress your walking program by adding 1- minutes to your routine each week, as tolerated. Be sure to wear good walking shoes, walk in a safe environment and only progress to your tolerance.       WORK ON increasing speed safely  Access Code: [email or web address] URL: [email or web address] Date: 07/26/2022 Prepared by: Oneita Kras  Exercises - Deep Squat   - 1-2 x daily - 5 x weekly - 2-3 sets - 5 reps - 3 hold - Walking with Eyes Closed and Counter Support  - 1 x daily - 5 x weekly - 2 sets - 10 reps - Bird Dog  - 1-2 x daily - 5 x weekly - 3 sets - 10 reps - 3 hold - Modified Push Up on Knees  - 1 x daily - 7 x  weekly - 3 sets - 10 reps - Farmer's Carry with Kettlebells  - 1 x daily - 7 x weekly - 1 sets - 10 reps - Sit to Stand  - 1 x daily - 7 x weekly - 3 sets - 10 reps  GOALS: Goals reviewed with patient? Yes  SHORT TERM GOALS: Target date: 08/21/22  Pt will be IND with initial HEP in order to indicate improved functional mobility and dec fall risk. Baseline:None initiated today  Goal status: INITIAL  2.  Pt will improve gait speed to >/=2.0 ft/sec w/ RW in order to indicate dec fall risk.  Baseline: 1.43 ft/sec with RW Goal status: INITIAL  3.  Pt will ambulate 150' with  quad tip cane at S level in order to indicate more independent household ambulation.   Baseline: Ambulates with RW at this time  Goal status: INITIAL  4.  Will assess appropriateness of foot up brace on LLE to assist with foot clearance.  Baseline: Not assessed at this time  Goal status: INITIAL  5.  Pt will improve FGA to >/=17/30 in order to indicate dec fall risk.   Baseline: 11/30 Goal status: INITIAL  6.  Pt will negotiate up/down 4 steps with single rail/cane, up/down ramp and curb and ambulate x 500' outdoors over unlevel paved surfaces at S level with LRAD in order to indicate improved community mobility.   Baseline: Needs B rails for stairs and RW for all gait Goal status: INITIAL  LONG TERM GOALS: Target date: 09/19/22  Pt will be IND with final HEP in order to indicate improved functional mobility and dec fall risk. Baseline: None initiated on eval Goal status: INITIAL  2.  Pt will improve gait speed to >/=2.62 ft/sec w/ cane in order to indicate dec fall risk.  Baseline: 1.44 ft/sec with RW Goal status: INITIAL  3.  Pt will improve FGA to >/=23/30 in order to indicate dec fall risk.   Baseline: 11/30 Goal status: INITIAL  4.  Pt will improve 5TSS to </=15 secs without UE support with equal LE WB in order to indicate dec fall risk and improved functional strength.   Baseline: 30.26 secs with heavy use of RLE Goal status: INITIAL  5.  Pt will negotiate up/down 4 steps with single rail, up/down ramp and curb and ambulate x 500' outdoors over unlevel paved surfaces at mod I level with cane in order to indicate improved community mobility.   Baseline: Needing B rails for stairs, RW for all gait  Goal status: INITIAL  6.  Pt will ambulate x 150' without AD at S level in order to simulate improved mobility in household setting.  Baseline: Needs RW for safety Goal status: INITIAL  ASSESSMENT:  CLINICAL IMPRESSION: Emphasis of skilled PT session on working on  ongoing physical conditioning and strengthening in order to prepare patient physically for a safe return to work. Pt does remain limited in his ability to safely carry weighted objects by ongoing L finger numbness and decreased grip strength in L hand vs R hand. Pt also fatigues very quickly from physical activity involving lifting and requires extended time period to recover from a 45 min session of this (several days) and is therefore likely not able to physically handle a return to his full work duties at this time due to heavy lifting required by his job. Pt can benefit from ongoing physical conditioning and strengthening to work towards a safe return to work. Continue POC.   OBJECTIVE  IMPAIRMENTS: Abnormal gait, decreased balance, decreased mobility, decreased strength, impaired sensation, and impaired UE functional use.   ACTIVITY LIMITATIONS: carrying, lifting, standing, squatting, stairs, transfers, and locomotion level  PARTICIPATION LIMITATIONS: cleaning, laundry, personal finances, driving, shopping, community activity, occupation, and yard work  PERSONAL FACTORS: Behavior pattern, Profession, and 1-2 comorbidities: see above  are also affecting patient's functional outcome.   REHAB POTENTIAL: Excellent  CLINICAL DECISION MAKING: Evolving/moderate complexity  EVALUATION COMPLEXITY: Moderate  PLAN:  PT FREQUENCY: 2x/week  PT DURATION: 8 weeks  PLANNED INTERVENTIONS: Therapeutic exercises, Therapeutic activity, Neuromuscular re-education, Balance training, Gait training, Patient/Family education, Self Care, Stair training, Vestibular training, Orthotic/Fit training, and DME instructions  PLAN FOR NEXT SESSION:  Ask about how work is going.  Add high level strengthening and  balance as able, gait without device, eyes closed, dynamic on compliant/unlevel surfaces, farmers carry, BOSU surge, lifting mechanics; up and down tasks with blaze pods, circuit training   Excell Seltzer,  PT, DPT, CSRS  08/13/22, 10:18 AM

## 2022-08-16 ENCOUNTER — Ambulatory Visit: Payer: Medicaid Other | Admitting: Occupational Therapy

## 2022-08-16 ENCOUNTER — Encounter: Payer: Self-pay | Admitting: Physical Therapy

## 2022-08-16 ENCOUNTER — Encounter: Payer: Self-pay | Admitting: Nurse Practitioner

## 2022-08-16 ENCOUNTER — Ambulatory Visit: Payer: Medicaid Other | Admitting: Physical Therapy

## 2022-08-16 ENCOUNTER — Other Ambulatory Visit: Payer: Self-pay | Admitting: Nurse Practitioner

## 2022-08-16 ENCOUNTER — Encounter: Payer: Self-pay | Admitting: Occupational Therapy

## 2022-08-16 VITALS — BP 141/88

## 2022-08-16 DIAGNOSIS — M6281 Muscle weakness (generalized): Secondary | ICD-10-CM

## 2022-08-16 DIAGNOSIS — R41844 Frontal lobe and executive function deficit: Secondary | ICD-10-CM

## 2022-08-16 DIAGNOSIS — R2681 Unsteadiness on feet: Secondary | ICD-10-CM

## 2022-08-16 DIAGNOSIS — G8194 Hemiplegia, unspecified affecting left nondominant side: Secondary | ICD-10-CM

## 2022-08-16 DIAGNOSIS — R41840 Attention and concentration deficit: Secondary | ICD-10-CM

## 2022-08-16 NOTE — Telephone Encounter (Signed)
Thanks so much!! Would this require a referral from me to ortho or just OP rehab?

## 2022-08-16 NOTE — Telephone Encounter (Signed)
I would anticipate at least 4 weeks, but he would need to attend an outpatient physical therapy clinic that does "work hardening" as our clinic does not currently specialize in that area. This was discussed with him during his therapy session this date. If he completes this program at an orthopedic therapy clinic they would be better able to determine when he is ready to resume his full work duties. He has met his goals for PT from our clinic's standpoint and we plan to discharge him after his next visit.  Thank you, Excell Seltzer, PT, DPT, [facility] [address] [place], [place]  [address] Phone:  [phone] Fax:  [phone]

## 2022-08-16 NOTE — Therapy (Signed)
OUTPATIENT PHYSICAL THERAPY NEURO TREATMENT   Patient Name: Scott Glenn MRN: [id number] DOB:Dec 02, 1970, 52 y.o., male 26 Date: 08/16/2022   PCP: Geryl Rankins, NP  REFERRING PROVIDER: Talbert Cage, MD (hospitalist so will send cert to [place])  [place]:  PT End of Session - 08/16/22 0932     Visit Number 7    Number of Visits 17    Date for PT Re-Evaluation 09/19/22    Authorization Type [employer] Medicaid    Authorization - Number of Visits 27    PT Start Time 0930    PT Stop Time 1013    PT Time Calculation (min) 43 min    Equipment Utilized During Treatment Gait belt    Activity Tolerance Patient tolerated treatment well    Behavior During Therapy WFL for tasks assessed/performed               Past Medical History:  Diagnosis Date   Asthma    Chicken pox    Migraines    Seasonal allergies    History reviewed. No pertinent surgical history. Patient Active Problem List   Diagnosis Date Noted   Stroke [facility]) 07/15/2022   Acute focal neurological deficit 07/15/2022   Left hemiparesis ([place]) 07/15/2022   Vitamin D deficiency 09/25/2020   Elevated blood pressure reading without diagnosis of hypertension 09/23/2020   Acute non intractable tension-type headache 09/23/2020   Psychophysiological insomnia 09/23/2020   Stress 09/23/2020   Fatigue 09/23/2020   ALLERGIC RHINITIS WITH CONJUNCTIVITIS 05/02/2008   ASTHMA 05/02/2008   GASTRITIS 03/03/2007    ONSET DATE: 07/15/22 (per ED notes) Pt reports he doesn't remember anything from 07/14/22 that night.    REFERRING DIAG:  G81.90 (ICD-10-CM) - Hemiparesis ([place])    THERAPY DIAG:  Muscle weakness (generalized)  Hemiplegia affecting left nondominant side, unspecified etiology, unspecified hemiplegia type ([place])  Unsteadiness on feet  Rationale for Evaluation and Treatment: Rehabilitation  SUBJECTIVE:                                                                                                                                                                                              SUBJECTIVE STATEMENT: Pt reports no falls or acute changes since last visit, no pain today.  Pt reports ongoing numbness in a couple of left fingers.  Pt reports that he remains on light duty at work, but today is possibly his last day (he is going to confirm with his doctor).    Pt accompanied by: self  PERTINENT HISTORY: PMH includes migraines, chicken pox, asthma.  PAIN:  Are you having pain? No PRECAUTIONS: Fall  WEIGHT  BEARING RESTRICTIONS: No  FALLS: Has patient fallen in last 6 months? Yes. 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  TODAY'S TREATMENT:                                                                                                                               Therex: Treadmill training on 75mnute incline cycle up to 8% over 8.5 minutes at 2.013m, pt endorses mild low back pain following task, discussion of focusing on glut/core/back strengthening with regular exercise at home Lift form w/ 50lb crate floor to waist x5 > 200' level floor carry > up and down (retro stepping) stairs CGA for retro down steps, but no overt LOB  NMR: -Forward and retro unsupported stepping on stairs w/ blaze pods 3x1 minute rounds, 15 second rest between sets, no overt LOB, pt independent w/ task -Forward and backwards resisted walking 2x30' > multidirectional perturbations 2x30' each direction  PATIENT EDUCATION: Education details: Discussed exercise progression and working up to full duty level tasks  at home, preventing overdoing it by resting as needed, job modifications for lifting using rest breaks and task breakdown, lifting mechanics, discharge next visit at [facility] assessment (pt in agreement, but reports nervousness as he fears he might over-do things). Person educated: Patient and Spouse Education method: Explanation, Verbal cues, and Handouts Education comprehension: verbalized understanding and needs further education  HOME EXERCISE PROGRAM: Walking Program: Begin walking for exercise for 10 minutes, 1-2 times/day, most days/week.   Progress your walking program by adding 1- minutes to your routine each week, as tolerated. Be sure to wear good walking shoes, walk in a safe environment and only progress to your tolerance.       WORK ON increasing speed safely  Access Code: [email or web address] URL: [email or web address] Date: 07/26/2022 Prepared by: KaOneita KrasExercises - Deep Squat   - 1-2 x daily - 5 x weekly - 2-3 sets - 5 reps - 3 hold - Walking with Eyes Closed and Counter Support  - 1 x daily - 5 x weekly - 2 sets - 10 reps - Bird Dog  - 1-2 x daily - 5 x weekly - 3 sets - 10 reps - 3 hold - Modified Push Up on Knees  - 1 x daily - 7 x weekly - 3 sets - 10 reps - Farmer's Carry with Kettlebells  - 1 x daily - 7 x weekly - 1 sets - 10 reps - Sit to Stand  - 1 x daily - 7 x weekly - 3 sets - 10 reps  GOALS:  Goals reviewed with patient? Yes  SHORT TERM GOALS: Target date: 08/21/22  Pt will be IND with initial HEP in order to indicate improved functional mobility and dec fall risk. Baseline:None initiated today  Goal status: INITIAL  2.  Pt will improve gait speed to >/=2.0 ft/sec w/ RW in order to indicate dec fall risk.  Baseline: 1.43 ft/sec with RW Goal status: INITIAL  3.  Pt will ambulate 150' with quad tip cane at S level in order to indicate more independent household ambulation.   Baseline: Ambulates with RW at this time  Goal status: INITIAL  4.  Will  assess appropriateness of foot up brace on LLE to assist with foot clearance.  Baseline: Not assessed at this time  Goal status: INITIAL  5.  Pt will improve FGA to >/=17/30 in order to indicate dec fall risk.   Baseline: 11/30 Goal status: INITIAL  6.  Pt will negotiate up/down 4 steps with single rail/cane, up/down ramp and curb and ambulate x 500' outdoors over unlevel paved surfaces at S level with LRAD in order to indicate improved community mobility.   Baseline: Needs B rails for stairs and RW for all gait Goal status: INITIAL  LONG TERM GOALS: Target date: 09/19/22  Pt will be IND with final HEP in order to indicate improved functional mobility and dec fall risk. Baseline: None initiated on eval Goal status: INITIAL  2.  Pt will improve gait speed to >/=2.62 ft/sec w/ cane in order to indicate dec fall risk.  Baseline: 1.44 ft/sec with RW Goal status: INITIAL  3.  Pt will improve FGA to >/=23/30 in order to indicate dec fall risk.   Baseline: 11/30 Goal status: INITIAL  4.  Pt will improve 5TSS to </=15 secs without UE support with equal LE WB in order to indicate dec fall risk and improved functional strength.   Baseline: 30.26 secs with heavy use of RLE Goal status: INITIAL  5.  Pt will negotiate up/down 4 steps with single rail, up/down ramp and curb and ambulate x 500' outdoors over unlevel paved surfaces at mod I level with cane in order to indicate improved community mobility.   Baseline: Needing B rails for stairs, RW for all gait  Goal status: INITIAL  6.  Pt will ambulate x 150' without AD at S level in order to simulate improved mobility in household setting.  Baseline: Needs RW for safety Goal status: INITIAL  ASSESSMENT:  CLINICAL IMPRESSION: Time spent discussing pt progress and assessment of goals due next session with possibility of early discharge.  Pt is open to this idea, but would benefit from further education on fatigue management and safe  progression of exercise to promote efficient return to work.  He may benefit from work hardening offered outside this clinic if fatigue continues to be a limiting factor for him as he prepares to return to full duty.  Overall, he manages safe lifting and carrying form with significant weight added this session.  He is dynamically independent and stable with tasks like stairs and perturbation recovery.  His reactive balance and stepping strategy are intact and successful to challenges as noted with tasks today.  Will assess STGs next session with tentative plan to discharge to home management.   OBJECTIVE IMPAIRMENTS: Abnormal gait, decreased balance, decreased mobility, decreased strength, impaired sensation, and impaired UE functional use.   ACTIVITY LIMITATIONS: carrying, lifting, standing, squatting, stairs, transfers, and locomotion level  PARTICIPATION LIMITATIONS: cleaning, laundry, personal finances, driving, shopping,  community activity, occupation, and yard work  PERSONAL FACTORS: Behavior pattern, Profession, and 1-2 comorbidities: see above  are also affecting patient's functional outcome.   REHAB POTENTIAL: Excellent  CLINICAL DECISION MAKING: Evolving/moderate complexity  EVALUATION COMPLEXITY: Moderate  PLAN:  PT FREQUENCY: 2x/week  PT DURATION: 8 weeks  PLANNED INTERVENTIONS: Therapeutic exercises, Therapeutic activity, Neuromuscular re-education, Balance training, Gait training, Patient/Family education, Self Care, Stair training, Vestibular training, Orthotic/Fit training, and DME instructions  PLAN FOR NEXT SESSION:  ASSESS STGs-discharge?  Ask about how work is going.  Add high level strengthening and  balance as able, gait without device, eyes closed, dynamic on compliant/unlevel surfaces, farmers carry, BOSU surge, lifting mechanics; up and down tasks with blaze pods, circuit training   Elease Etienne, PT, DPT  08/16/22, 10:13 AM

## 2022-08-16 NOTE — Telephone Encounter (Signed)
Thanks so much. 

## 2022-08-16 NOTE — Therapy (Signed)
OUTPATIENT OCCUPATIONAL THERAPY NEURO Treatment  Patient Name: Scott Glenn MRN: [id number] DOB:Oct 19, 1970, 52 y.o., male Today's Date: 08/16/2022  PCP: Sharyn Blitz REFERRING PROVIDER: Geryl Rankins, NP  END OF SESSION:  OT End of Session - 08/16/22 1054     Visit Number 4    Number of Visits 13    Date for OT Re-Evaluation 09/20/22    Authorization Type [employer] Medicaid    Authorization Time Period 7 weeks- 12 visits plus eval over 7 weeks    OT Start Time 0850    OT Stop Time 0930    OT Time Calculation (min) 40 min    Activity Tolerance Patient tolerated treatment well    Behavior During Therapy WFL for tasks assessed/performed               Past Medical History:  Diagnosis Date   Asthma    Chicken pox    Migraines    Seasonal allergies    No past surgical history on file. Patient Active Problem List   Diagnosis Date Noted   Stroke ([place]) 07/15/2022   Acute focal neurological deficit 07/15/2022   Left hemiparesis ([place]) 07/15/2022   Vitamin D deficiency 09/25/2020   Elevated blood pressure reading without diagnosis of hypertension 09/23/2020   Acute non intractable tension-type headache 09/23/2020   Psychophysiological insomnia 09/23/2020   Stress 09/23/2020   Fatigue 09/23/2020   ALLERGIC RHINITIS WITH CONJUNCTIVITIS 05/02/2008   ASTHMA 05/02/2008   GASTRITIS 03/03/2007    ONSET DATE: 07/15/22  REFERRING DIAG:  Diagnosis  G81.94 (ICD-10-CM) - Left hemiparesis ([facility])    THERAPY DIAG:   Muscle weakness (generalized)  Hemiplegia affecting left nondominant side, unspecified etiology, unspecified hemiplegia type ([place])  Attention and concentration deficit  Frontal lobe and executive function deficit  Unsteadiness on feet  Rationale for Evaluation and Treatment: Rehabilitation  SUBJECTIVE:   SUBJECTIVE STATEMENT: Pt reports green band has gotten too easy Pt accompanied by: significant other  PERTINENT HISTORY: Patient is a 52 y/o male  who presents on 1/11 with left sided weakness and difficulty speaking. NIH:18. Head CT, MRI and EEG-unremarkable. Some concern for underlying functional etiology to his presentation given he was noted to raise his Lft arm and Lft leg up to assist with changing into hospital gown in ED. PMH includes migraines, chicken pox, asthma.  PRECAUTIONS: fall  WEIGHT BEARING RESTRICTIONS: No    PAIN:  Are you having pain? Yes: NPRS scale: 4/10 Pain location: elbow L Pain description: ache Aggravating factors: unknown Relieving factors: massage   FALLS: Has patient fallen in last 6 months? 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  OBJECTIVE:   HAND DOMINANCE: Right  ADLs: Overall ADLs: mod I with all basic ADLS Transfers/ambulation related to ADLs:mod I   IADLs: Shopping: needs assist for transportation  Light housekeeping: washes dishes, cleaning Meal Prep: mod I per pt report, however he reports burning himself Community mobility: mod I Medication management: handles own meds Handwriting:  uses RUE  MOBILITY STATUS: Independent    ACTIVITY TOLERANCE: Activity tolerance: fatigues after 20-30 mins of activity on his feet  FUNCTIONAL OUTCOME MEASURES: Upper Extremity Functional Scale (UEFS): 86.25%  UPPER EXTREMITY ROM:  WFLS bilaterally    UPPER EXTREMITY MMT:     MMT Right eval Left eval  Shoulder flexion 5/5 4+5  Shoulder abduction  Shoulder adduction    Shoulder extension    Shoulder internal rotation    Shoulder external rotation    Middle trapezius    Lower trapezius    Elbow flexion 5/5 4+/5  Elbow extension 5/5 4+/5  Wrist flexion    Wrist extension    Wrist ulnar deviation    Wrist radial deviation    Wrist pronation    Wrist supination    (Blank rows = not tested)  HAND  FUNCTION: Grip strength: Right: 116.8 lbs; Left: 60.8 lbs  COORDINATION: 9 Hole Peg test: Right: 30.66 sec; Left: 24.97 sec  SENSATION: Light touch: Impaired  for middle and tring finger of left hand    COGNITION: Overall cognitive status: Impaired, short term memory deficits, slowed processing  VISION: Subjective report: difficulty reading fine print Baseline vision: Wears glasses for reading only   VISION ASSESSMENT: Not tested Reports difficulty with reading fine print, has readers but did not have them today        TODAY'S TREATMENT:                                                                                                                               DATE: 08/16/22  Upgraded to silver theraband HEP, 15 reps each, for bilateral UE's Arm bike x 8 mins level 4 for conditioning, 4 mins  each direction Ambulating 115" while carrying a crate with  50 lbs Then lifting the 50 lbs crate  2 sets x 5 reps from floor to table , min v.c for techniques See education below         PATIENT EDUCATION: Education details: silver theraband HEP,  upgraded previous HEP, upgraded putty exercises to blue putty for sustained grip and pinch Person educated: Patient, wife Education method: Explanation, demonstration, min v.c Education comprehension: verbalized understanding, returned demonstration  HOME EXERCISE PROGRAM: N/a   GOALS: Goals reviewed with patient? Yes  SHORT TERM GOALS: Target date: 09/01/22  I with HEP for grip strength Baseline:dependent Goal status: met  2.  Pt will increase LUE grip stength by 10 # for increased functional use Baseline: RUE 116.8 #, LUE 60.8# Goal status: met 100.9#  3.  Pt will verbalize understanding of sensory precautions for LUE. Baseline: numbness in middle and ring finger LUE Goal status: met  4.  Pt will verbalize understanding of recommendations for safe exercise Baseline: dependent Goal status: ongoing     LONG  TERM GOALS: Target date: 09/20/22  I with HEP for proximal strength Baseline: dependent Goal status: ongoing  2.  Pt will demonstrate improved upper extremity function as evidenced by improve UEFS to 90% or better. Baseline: 86.25% Goal status: INITIAL  3.  Pt will perform simulated work activities modified independently without drops Baseline: on light duty, unable to lift heavier items due to risk for injury Goal status:   4.  Pt will report ability to perform home management/ functional activity in standing for 45  mins without rest or dyspnea Baseline: 20-30 mins prior to becoming fatigued/ winded Goal status: INITIAL    ASSESSMENT:  CLINICAL IMPRESSION: Pt is progressing towards goals. He demonstrates understanding of  upgraded theraband and putty exercises. He demonstrates improving strength. PERFORMANCE DEFICITS: in functional skills including ADLs, IADLs, coordination, dexterity, sensation, ROM, strength, flexibility, Fine motor control, Gross motor control, mobility, balance, endurance, decreased knowledge of precautions, decreased knowledge of use of DME, vision, and UE functional use, cognitive skills including memory, and psychosocial skills including coping strategies, environmental adaptation, habits, interpersonal interactions, and routines and behaviors.   IMPAIRMENTS: are limiting patient from ADLs, IADLs, work, leisure, and social participation.   CO-MORBIDITIES: may have co-morbidities  that affects occupational performance. Patient will benefit from skilled OT to address above impairments and improve overall function.  MODIFICATION OR ASSISTANCE TO COMPLETE EVALUATION: No modification of tasks or assist necessary to complete an evaluation.  OT OCCUPATIONAL PROFILE AND HISTORY: Problem focused assessment: Including review of records relating to presenting problem.  CLINICAL DECISION MAKING: LOW - limited treatment options, no task modification necessary  REHAB  POTENTIAL: Good  EVALUATION COMPLEXITY: Low    PLAN:  OT FREQUENCY:  12 visits plus eval   OT DURATION: other: 7 weeks  PLANNED INTERVENTIONS: self care/ADL training, therapeutic exercise, therapeutic activity, neuromuscular re-education, manual therapy, passive range of motion, balance training, functional mobility training, splinting, ultrasound, paraffin, fluidotherapy, moist heat, patient/family education, cognitive remediation/compensation, visual/perceptual remediation/compensation, energy conservation, coping strategies training, and DME and/or AE instructions  RECOMMENDED OTHER SERVICES: n/a  CONSULTED AND AGREED WITH PLAN OF CARE: Patient and family member/caregiver  PLAN FOR NEXT SESSION:continue to address work activities, grip and proximal strength, work towards unmet goals   Mark Benecke, OT 08/16/2022, 10:55 AM

## 2022-08-19 ENCOUNTER — Ambulatory Visit: Payer: Self-pay

## 2022-08-19 ENCOUNTER — Encounter: Payer: Self-pay | Admitting: Physical Therapy

## 2022-08-19 ENCOUNTER — Ambulatory Visit: Payer: Medicaid Other | Admitting: Physical Therapy

## 2022-08-19 ENCOUNTER — Ambulatory Visit: Payer: Medicaid Other | Admitting: Occupational Therapy

## 2022-08-19 VITALS — BP 144/100 | HR 56

## 2022-08-19 DIAGNOSIS — G8194 Hemiplegia, unspecified affecting left nondominant side: Secondary | ICD-10-CM

## 2022-08-19 DIAGNOSIS — M6281 Muscle weakness (generalized): Secondary | ICD-10-CM

## 2022-08-19 NOTE — Therapy (Signed)
[place] NO CHARGE   Patient Name: Scott Glenn MRN: [id number] DOB:[birth year], 52 y.o., male 32 Date: 08/19/2022   PCP: Geryl Rankins, NP  REFERRING PROVIDER: Talbert Cage, MD (hospitalist so will send cert to [place])  [place]:  PT End of Session - 08/19/22 1109     Visit Number 8    Number of Visits 17    Date for PT Re-Evaluation 09/19/22    Authorization Type U[employer] Medicaid    Authorization - Number of Visits 27    PT Start Time 1105    PT Stop Time 1120    PT Time Calculation (min) 15 min    Equipment Utilized During Treatment Gait belt    Activity Tolerance Patient tolerated treatment well    Behavior During Therapy WFL for tasks assessed/performed               Past Medical History:  Diagnosis Date   Asthma    Chicken pox    Migraines    Seasonal allergies    History reviewed. No pertinent surgical history. Patient Active Problem List   Diagnosis Date Noted   Stroke [facility]) 07/15/2022   Acute focal neurological deficit 07/15/2022   Left hemiparesis ([place]) 07/15/2022   Vitamin D deficiency 09/25/2020   Elevated blood pressure reading without diagnosis of hypertension 09/23/2020   Acute non intractable tension-type headache 09/23/2020   Psychophysiological insomnia 09/23/2020   Stress 09/23/2020   Fatigue 09/23/2020   ALLERGIC RHINITIS WITH CONJUNCTIVITIS 05/02/2008   ASTHMA 05/02/2008   GASTRITIS 03/03/2007    ONSET DATE: 07/15/22 (per ED notes) Pt reports he doesn't remember anything from 07/14/22 that night.    REFERRING DIAG:  G81.90 (ICD-10-CM) - Hemiparesis ([place])    THERAPY DIAG:  Muscle weakness (generalized)  Hemiplegia affecting left nondominant side, unspecified etiology, unspecified hemiplegia type ([place])  Rationale for Evaluation and Treatment: Rehabilitation  SUBJECTIVE:                                                                                                                                                                                              SUBJECTIVE STATEMENT: Pt denies falls or other acute changes, but endorses he feels drained today and is unsure why.  He states he has not been formally cleared by MD to return to full duty as MD is awaiting PT notes on progress.  He states he has eaten today.    Pt accompanied by: self  PERTINENT HISTORY: PMH includes migraines, chicken pox, asthma.  PAIN:  Are you having pain? No PRECAUTIONS: Fall  WEIGHT BEARING RESTRICTIONS: No  FALLS: Has patient fallen in last 6 months? Yes. 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   PATIENT EDUCATION: Education details: Discussed work-hardening referral in future if pt stable to prepare for return to full duty.  Edu on s/s of recurrent CVA/TIA requiring emergency attention.  Pt at current does not have a headache, but feels one trying to come on due to tension in back of neck/head.  Discussed option to leave and go to ED now as pt and wife voice concern as this is how prior episode began.  Discussed BP parameters and limits to activity.  Will maintain current PT schedule until pt appears back at baseline before referring to work hardening.  Edu to discuss fluctuation in BP w/ PCP especially if it remains high even asymptomatically.  Endorsed  logging BP as able.  Pt and wife endorse understanding.  PT provided nurse line as quick contact for further questions regarding appropriate level of care to seek out in the event his symptoms progress or he becomes anxious about situation. Person educated: Patient and Spouse Education method: Explanation, Verbal cues, and Handouts Education comprehension: verbalized understanding and needs further education  HOME EXERCISE PROGRAM: Walking Program: Begin walking for exercise for 10 minutes, 1-2 times/day, most days/week.   Progress your walking program by adding 1- minutes to your routine each week, as tolerated. Be sure to wear good walking shoes, walk in a safe environment and only progress to your tolerance.       WORK ON increasing speed safely  Access Code: [email or web address] URL: [email or web address] Date: 07/26/2022 Prepared by: Oneita Kras  Exercises - Deep Squat   - 1-2 x daily - 5 x weekly - 2-3 sets - 5 reps - 3 hold - Walking with Eyes Closed and Counter Support  - 1 x daily - 5 x weekly - 2 sets - 10 reps - Bird Dog  - 1-2 x daily - 5 x weekly - 3 sets - 10 reps - 3 hold - Modified Push Up on Knees  - 1 x daily - 7 x weekly - 3 sets - 10 reps - Farmer's Carry with Kettlebells  - 1 x daily - 7 x weekly - 1 sets - 10 reps - Sit to Stand  -  1 x daily - 7 x weekly - 3 sets - 10 reps  GOALS: Goals reviewed with patient? Yes  SHORT TERM GOALS: Target date: 08/21/22  Pt will be IND with initial HEP in order to indicate improved functional mobility and dec fall risk. Baseline:None initiated today  Goal status: INITIAL  2.  Pt will improve gait speed to >/=2.0 ft/sec w/ RW in order to indicate dec fall risk.  Baseline: 1.43 ft/sec with RW Goal status: INITIAL  3.  Pt will ambulate 150' with quad tip cane at S level in order to indicate more independent household ambulation.   Baseline: Ambulates with RW at this time  Goal status: INITIAL  4.  Will assess  appropriateness of foot up brace on LLE to assist with foot clearance.  Baseline: Not assessed at this time  Goal status: INITIAL  5.  Pt will improve FGA to >/=17/30 in order to indicate dec fall risk.   Baseline: 11/30 Goal status: INITIAL  6.  Pt will negotiate up/down 4 steps with single rail/cane, up/down ramp and curb and ambulate x 500' outdoors over unlevel paved surfaces at S level with LRAD in order to indicate improved community mobility.   Baseline: Needs B rails for stairs and RW for all gait Goal status: INITIAL  LONG TERM GOALS: Target date: 09/19/22  Pt will be IND with final HEP in order to indicate improved functional mobility and dec fall risk. Baseline: None initiated on eval Goal status: INITIAL  2.  Pt will improve gait speed to >/=2.62 ft/sec w/ cane in order to indicate dec fall risk.  Baseline: 1.44 ft/sec with RW Goal status: INITIAL  3.  Pt will improve FGA to >/=23/30 in order to indicate dec fall risk.   Baseline: 11/30 Goal status: INITIAL  4.  Pt will improve 5TSS to </=15 secs without UE support with equal LE WB in order to indicate dec fall risk and improved functional strength.   Baseline: 30.26 secs with heavy use of RLE Goal status: INITIAL  5.  Pt will negotiate up/down 4 steps with single rail, up/down ramp and curb and ambulate x 500' outdoors over unlevel paved surfaces at mod I level with cane in order to indicate improved community mobility.   Baseline: Needing B rails for stairs, RW for all gait  Goal status: INITIAL  6.  Pt will ambulate x 150' without AD at S level in order to simulate improved mobility in household setting.  Baseline: Needs RW for safety Goal status: INITIAL  ASSESSMENT:  CLINICAL IMPRESSION: No treatment provided this session, see edu for details regarding pt presentation to clinic today and BP issues.  He and wife were educated on precautionary options for further assessment including ED vs urgent care vs  PCP.  Will maintain PT schedule as is until pt returns to clinic presenting at baseline.  Will attempt to assess STGs next visit.  OBJECTIVE IMPAIRMENTS: Abnormal gait, decreased balance, decreased mobility, decreased strength, impaired sensation, and impaired UE functional use.   ACTIVITY LIMITATIONS: carrying, lifting, standing, squatting, stairs, transfers, and locomotion level  PARTICIPATION LIMITATIONS: cleaning, laundry, personal finances, driving, shopping, community activity, occupation, and yard work  PERSONAL FACTORS: Behavior pattern, Profession, and 1-2 comorbidities: see above  are also affecting patient's functional outcome.   REHAB POTENTIAL: Excellent  CLINICAL DECISION MAKING: Evolving/moderate complexity  EVALUATION COMPLEXITY: Moderate  PLAN:  PT FREQUENCY: 2x/week  PT DURATION: 8 weeks  PLANNED INTERVENTIONS: Therapeutic exercises, Therapeutic activity, Neuromuscular re-education, Balance  training, Gait training, Patient/Family education, Self Care, Stair training, Vestibular training, Orthotic/Fit training, and DME instructions  PLAN FOR NEXT SESSION:  ASSESS STGs-discharge? (Correct work hardening referral if appropriate-is he back at baseline from canceled visit?).  Add high level strengthening and  balance as able, gait without device, eyes closed, dynamic on compliant/unlevel surfaces, farmers carry, BOSU surge, lifting mechanics; up and down tasks with blaze pods, circuit training   Elease Etienne, PT, DPT  08/19/22, 11:29 AM

## 2022-08-19 NOTE — Telephone Encounter (Signed)
    Chief Complaint: Elevated BP   144/100 Symptoms: Feels tired Frequency: Today Pertinent Negatives: Patient denies  Disposition: '[]'$ ED /'[]'$ Urgent Care (no appt availability in office) / '[]'$ Appointment(In office/virtual)/ '[]'$  [facility]/ '[]'$ Home Care/ '[]'$ Refused Recommended Disposition /'[x]'$ [facility]/ '[]'$  Follow-up with PCP Additional Notes:   Reason for Disposition  [4] Systolic BP  >= 037 OR Diastolic >= 80 AND [5] not taking BP medications  Answer Assessment - Initial Assessment Questions 1. BLOOD PRESSURE: "What is the blood pressure?" "Did you take at least two measurements 5 minutes apart?"     144/100 2. OSET: "When did you take your blood pressure?"     Today at PT 3. HOW: "How did you take your blood pressure?" (e.g., automatic home BP monitor, visiting nurse)     PT 4. HISTORY: "Do you have a history of high blood pressure?"     yES 5. MEDICINES: "Are you taking any medicines for blood pressure?" "Have you missed any doses recently?"     No 6. OTHER SYMPTOMS: "Do you have any symptoms?" (e.g., blurred vision, chest pain, difficulty breathing, headache, weakness)     Feels drained, mils headache 7. PREGNANCY: "Is there any chance you are pregnant?" "When was your last menstrual period?"     N/a  Protocols used: Blood Pressure - High-A-AH

## 2022-08-22 ENCOUNTER — Other Ambulatory Visit: Payer: Self-pay | Admitting: Nurse Practitioner

## 2022-08-22 DIAGNOSIS — I639 Cerebral infarction, unspecified: Secondary | ICD-10-CM

## 2022-08-22 MED ORDER — LISINOPRIL 5 MG PO TABS: 5.0000 mg | ORAL_TABLET | Freq: Every day | ORAL | 3 refills | Status: AC

## 2022-08-23 ENCOUNTER — Encounter: Payer: Self-pay | Admitting: Occupational Therapy

## 2022-08-23 ENCOUNTER — Ambulatory Visit: Payer: Medicaid Other | Admitting: Occupational Therapy

## 2022-08-23 ENCOUNTER — Encounter: Payer: Self-pay | Admitting: Physical Therapy

## 2022-08-23 ENCOUNTER — Ambulatory Visit: Payer: Medicaid Other | Admitting: Physical Therapy

## 2022-08-23 VITALS — BP 112/86 | HR 92

## 2022-08-23 VITALS — BP 143/85

## 2022-08-23 DIAGNOSIS — R2681 Unsteadiness on feet: Secondary | ICD-10-CM

## 2022-08-23 DIAGNOSIS — G8194 Hemiplegia, unspecified affecting left nondominant side: Secondary | ICD-10-CM

## 2022-08-23 DIAGNOSIS — M6281 Muscle weakness (generalized): Secondary | ICD-10-CM

## 2022-08-23 DIAGNOSIS — R41844 Frontal lobe and executive function deficit: Secondary | ICD-10-CM

## 2022-08-23 DIAGNOSIS — R41840 Attention and concentration deficit: Secondary | ICD-10-CM

## 2022-08-23 NOTE — Therapy (Signed)
OUTPATIENT OCCUPATIONAL THERAPY NEURO Treatment  Patient Name: Scott Glenn MRN: [id number] DOB:19-Oct-1970, 52 y.o., male Today's Date: 08/23/2022  PCP: Sharyn Blitz REFERRING PROVIDER: Geryl Rankins, NP  END OF SESSION:  OCCUPATIONAL THERAPY DISCHARGE SUMMARY   Current functional level related to goals / functional outcomes: Pt met all goals.   Remaining deficits: Mildly decreased strength and endurance   Education / Equipment: Pt was educated regarding HEP and safe exercise at gym. Pt was instructed to fill his prescription for BP medicine and to monitor his BP at home. Pt verbalizes understanding of all education.   Patient agrees to discharge. Patient goals were met. Patient is being discharged due to being pleased with the current functional level..     OT End of Session - 08/23/22 0855     Visit Number 5    Number of Visits 13    Date for OT Re-Evaluation 09/20/22    Authorization Type U[employer] Medicaid    Authorization Time Period 7 weeks- 12 visits plus eval over 7 weeks    Authorization - Visit Number 4    Authorization - Number of Visits 12    OT Start Time [id number]    OT Stop Time 0929    OT Time Calculation (min) 38 min               Past Medical History:  Diagnosis Date   Asthma    Chicken pox    Migraines    Seasonal allergies    History reviewed. No pertinent surgical history. Patient Active Problem List   Diagnosis Date Noted   Stroke ([place]) 07/15/2022   Acute focal neurological deficit 07/15/2022   Left hemiparesis ([address]) 07/15/2022   Vitamin D deficiency 09/25/2020   Elevated blood pressure reading without diagnosis of hypertension 09/23/2020   Acute non intractable tension-type headache 09/23/2020   Psychophysiological insomnia 09/23/2020   Stress 09/23/2020   Fatigue 09/23/2020   ALLERGIC RHINITIS WITH CONJUNCTIVITIS 05/02/2008   ASTHMA 05/02/2008   GASTRITIS 03/03/2007    ONSET DATE: 07/15/22  REFERRING DIAG:  Diagnosis   G81.94 (ICD-10-CM) - Left hemiparesis ([facility])    THERAPY DIAG:   Muscle weakness (generalized)  Hemiplegia affecting left nondominant side, unspecified etiology, unspecified hemiplegia type ([place])  Attention and concentration deficit  Frontal lobe and executive function deficit  Unsteadiness on feet  Rationale for Evaluation and Treatment: Rehabilitation  SUBJECTIVE:   SUBJECTIVE STATEMENT: Pt agrees with d/c Pt accompanied by: significant other  PERTINENT HISTORY: Patient is a 52 y/o male who presents on 1/11 with left sided weakness and difficulty speaking. NIH:18. Head CT, MRI and EEG-unremarkable. Some concern for underlying functional etiology to his presentation given he was noted to raise his Lft arm and Lft leg up to assist with changing into hospital gown in ED. PMH includes migraines, chicken pox, asthma.  PRECAUTIONS: fall  WEIGHT BEARING RESTRICTIONS: No    PAIN:  no  FALLS: Has patient fallen in last 6 months? 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  OBJECTIVE:   HAND DOMINANCE: Right  ADLs: Overall ADLs: mod I with all basic ADLS Transfers/ambulation related to ADLs:mod I   IADLs: Shopping: needs assist for transportation  Light housekeeping: washes dishes, cleaning Meal Prep: mod I per pt report, however he reports burning himself Community mobility: mod I Medication management: handles  own meds Handwriting:  uses RUE  MOBILITY STATUS: Independent    ACTIVITY TOLERANCE: Activity tolerance: fatigues after 20-30 mins of activity on his feet  FUNCTIONAL OUTCOME MEASURES: Upper Extremity Functional Scale (UEFS): 86.25%  UPPER EXTREMITY ROM:  WFLS bilaterally    UPPER EXTREMITY MMT:     MMT Right eval Left eval  Shoulder flexion 5/5 4+5  Shoulder abduction     Shoulder adduction    Shoulder extension    Shoulder internal rotation    Shoulder external rotation    Middle trapezius    Lower trapezius    Elbow flexion 5/5 4+/5  Elbow extension 5/5 4+/5  Wrist flexion    Wrist extension    Wrist ulnar deviation    Wrist radial deviation    Wrist pronation    Wrist supination    (Blank rows = not tested)  HAND FUNCTION: Grip strength: Right: 116.8 lbs; Left: 60.8 lbs  COORDINATION: 9 Hole Peg test: Right: 30.66 sec; Left: 24.97 sec  SENSATION: Light touch: Impaired  for middle and tring finger of left hand    COGNITION: Overall cognitive status: Impaired, short term memory deficits, slowed processing  VISION: Subjective report: difficulty reading fine print Baseline vision: Wears glasses for reading only   VISION ASSESSMENT: Not tested Reports difficulty with reading fine print, has readers but did not have them today        TODAY'S TREATMENT:                                                                                                                               DATE:08/23/22 Reviewed silver theraband HEP, 15 reps each, for bilateral UE's, 15 reps each, min v.c  Checked progress towards goals. Ambulating 115" while carrying a crate with 50 lbs Then lifting the 50 lbs crate  2 sets x 5 reps from floor to table  Shoulder weight machine with pull down bar 30 lbs for 10 reps, triceps extension with bilateral UE's 30 lbs x 10 reps, Pt was cautioned to start out with light weights initially using weight machine in the 20-30 lbs weight range with bilateral UE's. He was cautioned against use of free weights initally. Pt was instructed to fill his BP medicine prescription and to monitor his BP at home.         PATIENT EDUCATION: Education details: silver theraband HEP,  safe exercise with weight machine Progress towards goals Education method: Explanation, demonstration, min v.c Education comprehension: verbalized  understanding, returned demonstration  HOME EXERCISE PROGRAM: Theraband HEP silver   GOALS: Goals reviewed with patient? Yes  SHORT TERM GOALS: Target date: 09/01/22  I with HEP for grip strength Baseline:dependent Goal status: met  2.  Pt will increase LUE grip stength by 10 # for increased functional use Baseline: RUE 116.8 #, LUE 60.8# Goal status: met 100.9#  3.  Pt will verbalize understanding of sensory precautions for LUE. Baseline: numbness in middle and ring  finger LUE Goal status: met  4.  Pt will verbalize understanding of recommendations for safe exercise Baseline: dependent Goal status: met     LONG TERM GOALS: Target date: 09/20/22  I with HEP for proximal strength Baseline: dependent Goal status: met  2.  Pt will demonstrate improved upper extremity function as evidenced by improve UEFS to 90% or better. Baseline: 86.25% Goal status: met 99%  3.  Pt will perform simulated work activities modified independently without drops Baseline: on light duty, unable to lift heavier items due to risk for injury Goal status:met carries  and lifts 50 lbs without difficulty, pt will be on light duty when he returns to work  4.  Pt will report ability to perform home management/ functional activity in standing for 45 mins without rest or dyspnea Baseline: 20-30 mins prior to becoming fatigued/ winded Goal status: met per pt report    ASSESSMENT:  CLINICAL IMPRESSION: Pt demonstrates good overall progress. He achieved all goals. He agrees with plans for d/c today. PERFORMANCE DEFICITS: in functional skills including ADLs, IADLs, coordination, dexterity, sensation, ROM, strength, flexibility, Fine motor control, Gross motor control, mobility, balance, endurance, decreased knowledge of precautions, decreased knowledge of use of DME, vision, and UE functional use, cognitive skills including memory, and psychosocial skills including coping strategies, environmental  adaptation, habits, interpersonal interactions, and routines and behaviors.   IMPAIRMENTS: are limiting patient from ADLs, IADLs, work, leisure, and social participation.   CO-MORBIDITIES: may have co-morbidities  that affects occupational performance. Patient will benefit from skilled OT to address above impairments and improve overall function.  MODIFICATION OR ASSISTANCE TO COMPLETE EVALUATION: No modification of tasks or assist necessary to complete an evaluation.  OT OCCUPATIONAL PROFILE AND HISTORY: Problem focused assessment: Including review of records relating to presenting problem.  CLINICAL DECISION MAKING: LOW - limited treatment options, no task modification necessary  REHAB POTENTIAL: Good  EVALUATION COMPLEXITY: Low    PLAN:  OT FREQUENCY:  12 visits plus eval   OT DURATION: other: 7 weeks  PLANNED INTERVENTIONS: self care/ADL training, therapeutic exercise, therapeutic activity, neuromuscular re-education, manual therapy, passive range of motion, balance training, functional mobility training, splinting, ultrasound, paraffin, fluidotherapy, moist heat, patient/family education, cognitive remediation/compensation, visual/perceptual remediation/compensation, energy conservation, coping strategies training, and DME and/or AE instructions  RECOMMENDED OTHER SERVICES: n/a  CONSULTED AND AGREED WITH PLAN OF CARE: Patient and family member/caregiver  PLAN FOR NEXT SESSION:d/c OT   Rishith Siddoway, OT 08/23/2022, 9:57 AM

## 2022-08-23 NOTE — Therapy (Unsigned)
OUTPATIENT PHYSICAL THERAPY NEURO TREATMENT-DISCHARGE SUMMARY   Patient Name: Scott Glenn MRN: [id number] DOB:[birth year], 52 y.o., male 46 Date: 08/25/2022   PCP: Geryl Rankins, NP  REFERRING PROVIDER: Talbert Cage, MD (hospitalist so will send cert to [place])  [place]  Visits from Start of Care: 9  Current functional level related to goals / functional outcomes: See clinical impression statement.   Remaining deficits: None   Education / Equipment: Discharge plan and progress towards goals, option of work hardening, and monitoring blood pressure at home.   Patient agrees to discharge. Patient goals were met. Patient is being discharged due to meeting the stated rehab goals.   END OF SESSION:   08/23/22 0938  PT Visits / Re-Eval  Visit Number 9  Number of Visits 17  Date for PT Re-Evaluation 09/19/22  Authorization  Authorization Type [employer]  Authorization - Number of Visits 27  PT Time Calculation  PT Start Time 0932  PT Stop Time 1005  PT Time Calculation (min) 33 min  PT - End of Session  Equipment Utilized During Treatment Gait belt  Activity Tolerance Patient tolerated treatment well  Behavior During Therapy WFL for tasks assessed/performed      Past Medical History:  Diagnosis Date   Asthma    Chicken pox    Migraines    Seasonal allergies    History reviewed. No pertinent surgical history. Patient Active Problem List   Diagnosis Date Noted   Stroke [facility]) 07/15/2022   Acute focal neurological deficit 07/15/2022   Left hemiparesis ([place]) 07/15/2022   Vitamin D deficiency 09/25/2020   Elevated blood pressure reading without diagnosis of hypertension 09/23/2020   Acute non intractable tension-type headache 09/23/2020   Psychophysiological insomnia 09/23/2020   Stress 09/23/2020   Fatigue 09/23/2020   ALLERGIC RHINITIS WITH CONJUNCTIVITIS 05/02/2008   ASTHMA 05/02/2008   GASTRITIS 03/03/2007     ONSET DATE: 07/15/22 (per ED notes) Pt reports he doesn't remember anything from 07/14/22 that night.    REFERRING DIAG:  G81.90 (ICD-10-CM) - Hemiparesis ([place])    THERAPY DIAG:  Muscle weakness (generalized)  Hemiplegia affecting left nondominant side, unspecified etiology, unspecified hemiplegia type ([place])  Unsteadiness on feet  Rationale for Evaluation and Treatment: Rehabilitation  SUBJECTIVE:                                                                                                                                                                                             SUBJECTIVE STATEMENT: Pt denies falls or other acute changes, but was put on blood pressure meds yesterday, has not taken first dose.  He  remains on light duty at work.    Pt accompanied by: self  PERTINENT HISTORY: PMH includes migraines, chicken pox, asthma.  PAIN:  Are you having pain? No PRECAUTIONS: Fall  WEIGHT BEARING RESTRICTIONS: No  FALLS: Has patient fallen in last 6 months? Yes. 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  Will not seek further/corrected referral. -Verbally reviewed HEP, pt is compliant to all but farmer's carry due to lacking  access to kettlebells.  He feels it is still beneficial, no modifications made this visit. -10MWT no AD:  6.60 seconds = 1.52 m/sec OR 5 ft/sec RAMP:  Level of Assistance: Complete Independence Assistive device utilized: None  CURB:  Level of Assistance: Complete Independence Assistive device utilized: None  STAIRS:  Level of Assistance: Complete Independence  Stair Negotiation Technique: Alternating Pattern  with No Rails  Number of Stairs: 4   Height of Stairs: 6"   GAIT: Gait pattern: WFL Distance walked: 500' + 300' Assistive device utilized: None Level of assistance: Complete Independence Comments: 500' outdoors over unlevel sidewalk and grass and 300' in clinic during assessments.  Maintains consistent increased pace of ambulation for all above tasks.  PATIENT EDUCATION: Education details: Discussed work-hardening referral in future to prepare for return to full duty.  Plan for discharge.  Progress towards goals.  Monitoring BP at home and discussing when to take BP medicine w/ MD.  MD will clear for return to full duty. Person educated: Patient and Spouse Education method: Explanation, Verbal cues, and Handouts Education comprehension: verbalized understanding and needs further education  HOME EXERCISE PROGRAM: Walking Program: Begin walking for exercise for 10 minutes, 1-2 times/day, most days/week.   Progress your walking program by adding 1- minutes to your routine each week, as tolerated. Be sure to wear good walking shoes, walk in a safe environment and only progress to your tolerance.       WORK ON increasing speed safely  Access Code: [email or web address] URL: [email or web address] Date: 07/26/2022 Prepared by: Oneita Kras  Exercises - Deep Squat   - 1-2 x daily - 5 x weekly - 2-3 sets - 5 reps - 3 hold - Walking with Eyes Closed  and Counter Support  - 1 x daily - 5 x weekly - 2 sets - 10 reps - Bird Dog  - 1-2 x daily - 5 x weekly - 3 sets - 10 reps - 3  hold - Modified Push Up on Knees  - 1 x daily - 7 x weekly - 3 sets - 10 reps - Farmer's Carry with Kettlebells  - 1 x daily - 7 x weekly - 1 sets - 10 reps - Sit to Stand  - 1 x daily - 7 x weekly - 3 sets - 10 reps  GOALS: Goals reviewed with patient? Yes  SHORT TERM GOALS: Target date: 08/21/22  Pt will be IND with initial HEP in order to indicate improved functional mobility and dec fall risk. Baseline:  Initiated, pt safe to return to gym environment as desired (2/19)  Goal status: MET  2.  Pt will improve gait speed to >/=2.0 ft/sec w/ RW in order to indicate dec fall risk.  Baseline: 1.43 ft/sec with RW; 5 ft/sec no AD Goal status: MET  3.  Pt will ambulate 150' with quad tip cane at S level in order to indicate more independent household ambulation.   Baseline: Ambulates with RW at this time; 300' during assessments in clinic w/o AD (2/19) Goal status: MET  4.  Will assess appropriateness of foot up brace on LLE to assist with foot clearance.  Baseline: Not assessed at this time; not needed at this time (2/19) Goal status: MET  5.  Pt will improve FGA to >/=17/30 in order to indicate dec fall risk.   Baseline: 11/30; 27/30 (2/19) Goal status: MET  6.  Pt will negotiate up/down 4 steps with single rail/cane, up/down ramp and curb and ambulate x 500' outdoors over unlevel paved surfaces at S level with LRAD in order to indicate improved community mobility.   Baseline: Needs B rails for stairs and RW for all gait; independent w/ all including 500' outdoors no AD (2/19) Goal status: MET  LONG TERM GOALS: Target date: 09/19/22  Pt will be IND with final HEP in order to indicate improved functional mobility and dec fall risk. Baseline: None initiated on eval Goal status: INITIAL  2.  Pt will improve gait speed to >/=2.62 ft/sec w/ cane in order to indicate dec fall risk.  Baseline: 1.44 ft/sec with RW Goal status: INITIAL  3.  Pt will improve FGA to >/=23/30 in order  to indicate dec fall risk.   Baseline: 11/30 Goal status: INITIAL  4.  Pt will improve 5TSS to </=15 secs without UE support with equal LE WB in order to indicate dec fall risk and improved functional strength.   Baseline: 30.26 secs with heavy use of RLE Goal status: INITIAL  5.  Pt will negotiate up/down 4 steps with single rail, up/down ramp and curb and ambulate x 500' outdoors over unlevel paved surfaces at mod I level with cane in order to indicate improved community mobility.   Baseline: Needing B rails for stairs, RW for all gait  Goal status: INITIAL  6.  Pt will ambulate x 150' without AD at S level in order to simulate improved mobility in household setting.  Baseline: Needs RW for safety Goal status: INITIAL  ASSESSMENT:  CLINICAL IMPRESSION: STGs assessed this session with patient meeting 6 of 6 goals.  He has an established and advanced HEP to assist him in returning to regular activity in the gym or other setting.  He ambulates at  a speed of 5.0 ft/sec, which is well above average, w/o need for an AD demonstrating no LOB over level or unlevel surfaces, ramp, and curb step.  He can negotiate 4 stairs without a rail reciprocally and no LOB.  He is considered an unlimited community ambulator at this time and has no bracing needs.  His FGA score improved to 27/30 from initial 11/30 placing him in a low fall risk category.  He is appropriate for and in agreement to discharge to home management at this time.  OBJECTIVE IMPAIRMENTS: Abnormal gait, decreased balance, decreased mobility, decreased strength, impaired sensation, and impaired UE functional use.   ACTIVITY LIMITATIONS: carrying, lifting, standing, squatting, stairs, transfers, and locomotion level  PARTICIPATION LIMITATIONS: cleaning, laundry, personal finances, driving, shopping, community activity, occupation, and yard work  PERSONAL FACTORS: Behavior pattern, Profession, and 1-2 comorbidities: see above  are also  affecting patient's functional outcome.   REHAB POTENTIAL: Excellent  CLINICAL DECISION MAKING: Evolving/moderate complexity  EVALUATION COMPLEXITY: Moderate  PLAN:  PT FREQUENCY: 2x/week  PT DURATION: 8 weeks  PLANNED INTERVENTIONS: Therapeutic exercises, Therapeutic activity, Neuromuscular re-education, Balance training, Gait training, Patient/Family education, Self Care, Stair training, Vestibular training, Orthotic/Fit training, and DME instructions  PLAN FOR NEXT SESSION:  N/A   Elease Etienne, PT, DPT  08/25/22, 6:26 AM

## 2022-08-24 ENCOUNTER — Ambulatory Visit: Payer: Medicaid Other | Attending: Nurse Practitioner | Admitting: Nurse Practitioner

## 2022-08-24 ENCOUNTER — Encounter: Payer: Self-pay | Admitting: Nurse Practitioner

## 2022-08-24 VITALS — BP 110/71 | HR 74 | Ht 69.0 in | Wt 170.6 lb

## 2022-08-24 DIAGNOSIS — R030 Elevated blood-pressure reading, without diagnosis of hypertension: Secondary | ICD-10-CM | POA: Diagnosis not present

## 2022-08-24 DIAGNOSIS — H538 Other visual disturbances: Secondary | ICD-10-CM

## 2022-08-24 NOTE — Progress Notes (Signed)
Assessment & Plan:  Scott Glenn was seen today for hospitalization follow-up and hypertension.  Diagnoses and all orders for this visit:  Elevated blood pressure reading without diagnosis of hypertension Continue lisinopril as prescribed.  Reminded to bring in blood pressure log for follow  up appointment.  RECOMMENDATIONS: DASH/Mediterranean Diets are healthier choices for HTN.    Blurred vision -     Ambulatory referral to Ophthalmology    Patient has been counseled on age-appropriate routine health concerns for screening and prevention. These are reviewed and up-to-date. Referrals have been placed accordingly. Immunizations are up-to-date or declined.    Subjective:   Chief Complaint  Patient presents with   Hospitalization Follow-up   Hypertension    Scott Glenn 52 y.o. male presents to office today for [place]  He has a PMH of asthma, allergies and migraines  [place] Presented to ED on July 15, 2022 and discharged on July 18, 2022 Noted for acute onset left hemiparesis and aphasia. In the ED, a code stroke was called.  CT head and CTA negative for stroke or intracranial abnormality.  EEG was started and he was loaded with Keppra.  Due to previous history of alcohol use disorder CIWA's were ordered and remained negative.  UDS resulted positive for THC.  EEG resulted with no seizure activity.  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  Neurology ruled out acute etiology and diagnosed him with conversion disorder.  PT and OT were consulted and assisted with mobility issues.  Psychiatry was consulted but recommended outpatient follow-up and did not see him inpatient.  He began improving and by time of discharge he was talking, standing on his own, walking. PT and OT recommended outpatient home health PT and OT. He was prescribed no new medications at discharge.   Since his hospital discharge he has completed treatment  with physical therapy. He is inquiring as to when he can return to work with no restrictions. At this time as he has met all goals with therapy he can return to work tomorrow with restrictions lifted.   Has fluctuating blood pressures at home with diastolic average 0000000. He was recently started on lisinopril 5 mg however he only recently started taking this over the past day or so.  BP Readings from Last 3 Encounters:  08/24/22 110/71  08/23/22 112/86  08/23/22 (!) 143/85     Review of Systems  Constitutional:  Negative for fever, malaise/fatigue and weight loss.  HENT: Negative.  Negative for nosebleeds.   Eyes: Negative.  Negative for blurred vision, double vision and photophobia.  Respiratory: Negative.  Negative for cough and shortness of breath.   Cardiovascular: Negative.  Negative for chest pain, palpitations and leg swelling.  Gastrointestinal: Negative.  Negative for heartburn, nausea and vomiting.  Musculoskeletal: Negative.  Negative for myalgias.  Neurological: Negative.  Negative for dizziness, focal weakness, seizures and headaches.  Psychiatric/Behavioral: Negative.  Negative for suicidal ideas.     Past Medical History:  Diagnosis Date   Asthma    Chicken pox    Migraines    Seasonal allergies     History reviewed. No pertinent surgical history.  Family History  Problem Relation Age of Onset   Hypertension Mother    Diabetes Mother    Stroke Father    Colon cancer Maternal Aunt    Colon polyps Neg Hx    Esophageal cancer Neg Hx    Stomach cancer Neg Hx  Rectal cancer Neg Hx     Social History Reviewed with no changes to be made today.   Outpatient Medications Prior to Visit  Medication Sig Dispense Refill   albuterol (PROVENTIL) (2.5 MG/3ML) 0.083% nebulizer solution Take 2.5 mg by nebulization daily as needed for wheezing or shortness of breath.     cholecalciferol (VITAMIN D3) 25 MCG (1000 UNIT) tablet Take 1,000 Units by mouth daily.      EPINEPHrine (EPIPEN IJ) Inject 1 Pen as directed as needed (For allergic reaction).     lisinopril (ZESTRIL) 5 MG tablet Take 1 tablet (5 mg total) by mouth daily. 90 tablet 3   No facility-administered medications prior to visit.    Allergies  Allergen Reactions   Banana     Itching, dyspnea and nausea   Bee Venom    Pork-Derived Products     Headache, diarrhea and vomiting       Objective:    BP 110/71   Pulse 74   Ht 5' 9"$  (1.753 m)   Wt 170 lb 9.6 oz (77.4 kg)   SpO2 98%   BMI 25.19 kg/m  Wt Readings from Last 3 Encounters:  08/24/22 170 lb 9.6 oz (77.4 kg)  07/15/22 160 lb 11.5 oz (72.9 kg)  03/30/22 166 lb 10.7 oz (75.6 kg)    Physical Exam Vitals and nursing note reviewed.  Constitutional:      Appearance: He is well-developed.  HENT:     Head: Normocephalic and atraumatic.  Cardiovascular:     Rate and Rhythm: Normal rate and regular rhythm.     Heart sounds: Normal heart sounds. No murmur heard.    No friction rub. No gallop.  Pulmonary:     Effort: Pulmonary effort is normal. No tachypnea or respiratory distress.     Breath sounds: Normal breath sounds. No decreased breath sounds, wheezing, rhonchi or rales.  Chest:     Chest wall: No tenderness.  Abdominal:     General: Bowel sounds are normal.     Palpations: Abdomen is soft.  Musculoskeletal:        General: Normal range of motion.     Cervical back: Normal range of motion.  Skin:    General: Skin is warm and dry.  Neurological:     Mental Status: He is alert and oriented to person, place, and time.     Coordination: Coordination normal.  Psychiatric:        Behavior: Behavior normal. Behavior is cooperative.        Thought Content: Thought content normal.        Judgment: Judgment normal.          Patient has been counseled extensively about nutrition and exercise as well as the importance of adherence with medications and regular follow-up. The patient was given clear instructions to  go to ER or return to medical center if symptoms don't improve, worsen or new problems develop. The patient verbalized understanding.   Follow-up: Return in about 2 weeks (around 09/07/2022) for 2 weeks virtual visit for HTN.   Gildardo Pounds, FNP-BC [facility] and [facility] [place], [place]   08/24/2022, 3:01 PM

## 2022-08-24 NOTE — Progress Notes (Signed)
Would like to be taken off of light duty, if possible.

## 2022-08-26 ENCOUNTER — Ambulatory Visit: Payer: Medicaid Other | Admitting: Rehabilitation

## 2022-08-26 ENCOUNTER — Encounter: Payer: Self-pay | Admitting: Occupational Therapy

## 2022-08-30 ENCOUNTER — Encounter: Payer: Self-pay | Admitting: Occupational Therapy

## 2022-08-30 ENCOUNTER — Ambulatory Visit: Payer: Medicaid Other | Admitting: Physical Therapy

## 2022-09-02 ENCOUNTER — Encounter: Payer: Self-pay | Admitting: Occupational Therapy

## 2022-09-02 ENCOUNTER — Ambulatory Visit: Payer: Medicaid Other | Admitting: Rehabilitation

## 2022-09-06 ENCOUNTER — Encounter: Payer: Self-pay | Admitting: Occupational Therapy

## 2022-09-06 ENCOUNTER — Ambulatory Visit: Payer: Medicaid Other | Admitting: Physical Therapy

## 2022-09-09 ENCOUNTER — Ambulatory Visit: Payer: Medicaid Other | Admitting: Physical Therapy

## 2022-09-09 ENCOUNTER — Encounter: Payer: Self-pay | Admitting: Occupational Therapy

## 2022-09-13 ENCOUNTER — Encounter: Payer: Self-pay | Admitting: Occupational Therapy

## 2022-09-13 ENCOUNTER — Ambulatory Visit: Payer: Medicaid Other | Admitting: Physical Therapy

## 2022-09-16 ENCOUNTER — Encounter: Payer: Self-pay | Admitting: Occupational Therapy

## 2022-09-16 ENCOUNTER — Ambulatory Visit: Payer: Medicaid Other | Admitting: Physical Therapy

## 2022-09-17 ENCOUNTER — Ambulatory Visit: Payer: Medicaid Other | Attending: Nurse Practitioner | Admitting: Nurse Practitioner

## 2022-09-17 ENCOUNTER — Encounter: Payer: Self-pay | Admitting: Nurse Practitioner

## 2022-09-17 VITALS — BP 122/81 | HR 74 | Ht 69.0 in | Wt 168.2 lb

## 2022-09-17 DIAGNOSIS — Z23 Encounter for immunization: Secondary | ICD-10-CM

## 2022-09-17 DIAGNOSIS — Z862 Personal history of diseases of the blood and blood-forming organs and certain disorders involving the immune mechanism: Secondary | ICD-10-CM

## 2022-09-17 DIAGNOSIS — R17 Unspecified jaundice: Secondary | ICD-10-CM | POA: Diagnosis not present

## 2022-09-17 DIAGNOSIS — I10 Essential (primary) hypertension: Secondary | ICD-10-CM | POA: Diagnosis not present

## 2022-09-17 DIAGNOSIS — E559 Vitamin D deficiency, unspecified: Secondary | ICD-10-CM

## 2022-09-17 NOTE — Progress Notes (Signed)
Assessment & Plan:  Scott Glenn was seen today for hypertension.  Diagnoses and all orders for this visit:  Primary hypertension Continue lisinopril as prescribed.  Reminded to bring in blood pressure log for follow  up appointment.  RECOMMENDATIONS: DASH/Mediterranean Diets are healthier choices for HTN.    Need for shingles vaccine -     Varicella-zoster vaccine IM  Vitamin D deficiency -     VITAMIN D 25 Hydroxy (Vit-D Deficiency, Fractures)  History of anemia -     CBC with Differential  Elevated bilirubin -     CMP14+EGFR    Patient has been counseled on age-appropriate routine health concerns for screening and prevention. These are reviewed and up-to-date. Referrals have been placed accordingly. Immunizations are up-to-date or declined.    Subjective:   Chief Complaint  Patient presents with   Hypertension   HPI Scott Glenn 52 y.o. male presents to office today for follow up to HTN  PMH: asthma, migraines, conversion disorder   Wants to know why he feels so fatigued since he was discharged from the hospital in January.    HTN Blood pressure is well controlled with lisinopril 5 mg daily. He does monitor his blood pressure at home.  BP Readings from Last 3 Encounters:  09/17/22 122/81  08/24/22 110/71  08/23/22 112/86     Review of Systems  Constitutional:  Positive for malaise/fatigue. Negative for fever and weight loss.  HENT: Negative.  Negative for nosebleeds.   Eyes: Negative.  Negative for blurred vision, double vision and photophobia.  Respiratory: Negative.  Negative for cough and shortness of breath.   Cardiovascular: Negative.  Negative for chest pain, palpitations and leg swelling.  Gastrointestinal: Negative.  Negative for heartburn, nausea and vomiting.  Musculoskeletal: Negative.  Negative for myalgias.  Neurological: Negative.  Negative for dizziness, focal weakness, seizures and headaches.  Psychiatric/Behavioral: Negative.  Negative  for suicidal ideas.     Past Medical History:  Diagnosis Date   Asthma    Chicken pox    Migraines    Seasonal allergies     History reviewed. No pertinent surgical history.  Family History  Problem Relation Age of Onset   Hypertension Mother    Diabetes Mother    Stroke Father    Colon cancer Maternal Aunt    Colon polyps Neg Hx    Esophageal cancer Neg Hx    Stomach cancer Neg Hx    Rectal cancer Neg Hx     Social History Reviewed with no changes to be made today.   Outpatient Medications Prior to Visit  Medication Sig Dispense Refill   albuterol (PROVENTIL) (2.5 MG/3ML) 0.083% nebulizer solution Take 2.5 mg by nebulization daily as needed for wheezing or shortness of breath.     cholecalciferol (VITAMIN D3) 25 MCG (1000 UNIT) tablet Take 1,000 Units by mouth daily.     EPINEPHrine (EPIPEN IJ) Inject 1 Pen as directed as needed (For allergic reaction).     lisinopril (ZESTRIL) 5 MG tablet Take 1 tablet (5 mg total) by mouth daily. 90 tablet 3   No facility-administered medications prior to visit.    Allergies  Allergen Reactions   Banana     Itching, dyspnea and nausea   Bee Venom    Pork-Derived Products     Headache, diarrhea and vomiting       Objective:    BP 122/81   Pulse 74   Ht 5\' 9"  (1.753 m)   Wt 168 lb 3.2  oz (76.3 kg)   SpO2 98%   BMI 24.84 kg/m  Wt Readings from Last 3 Encounters:  09/17/22 168 lb 3.2 oz (76.3 kg)  08/24/22 170 lb 9.6 oz (77.4 kg)  07/15/22 160 lb 11.5 oz (72.9 kg)    Physical Exam Vitals and nursing note reviewed.  Constitutional:      Appearance: He is well-developed.  HENT:     Head: Normocephalic and atraumatic.  Cardiovascular:     Rate and Rhythm: Normal rate and regular rhythm.     Heart sounds: Normal heart sounds. No murmur heard.    No friction rub. No gallop.  Pulmonary:     Effort: Pulmonary effort is normal. No tachypnea or respiratory distress.     Breath sounds: Normal breath sounds. No decreased  breath sounds, wheezing, rhonchi or rales.  Chest:     Chest wall: No tenderness.  Abdominal:     General: Bowel sounds are normal.     Palpations: Abdomen is soft.  Musculoskeletal:        General: Normal range of motion.     Cervical back: Normal range of motion.  Skin:    General: Skin is warm and dry.  Neurological:     Mental Status: He is alert and oriented to person, place, and time.     Coordination: Coordination normal.  Psychiatric:        Behavior: Behavior normal. Behavior is cooperative.        Thought Content: Thought content normal.        Judgment: Judgment normal.          Patient has been counseled extensively about nutrition and exercise as well as the importance of adherence with medications and regular follow-up. The patient was given clear instructions to go to ER or return to medical center if symptoms don't improve, worsen or new problems develop. The patient verbalized understanding.   Follow-up: Return in about 3 months (around 12/18/2022).   Gildardo Pounds, FNP-BC [facility] and [facility], [place]   09/17/2022, 8:05 PM

## 2022-11-01 IMAGING — DX DG FOOT COMPLETE 3+V*L*
3 series · 3 of 3 positions shown · non-contrast
Comparison: None.

CLINICAL DATA: 50-year-old male status post blunt trauma when
furniture fell on left lower extremity.

EXAM:
LEFT FOOT - COMPLETE 3+ VIEW

[foot ap]
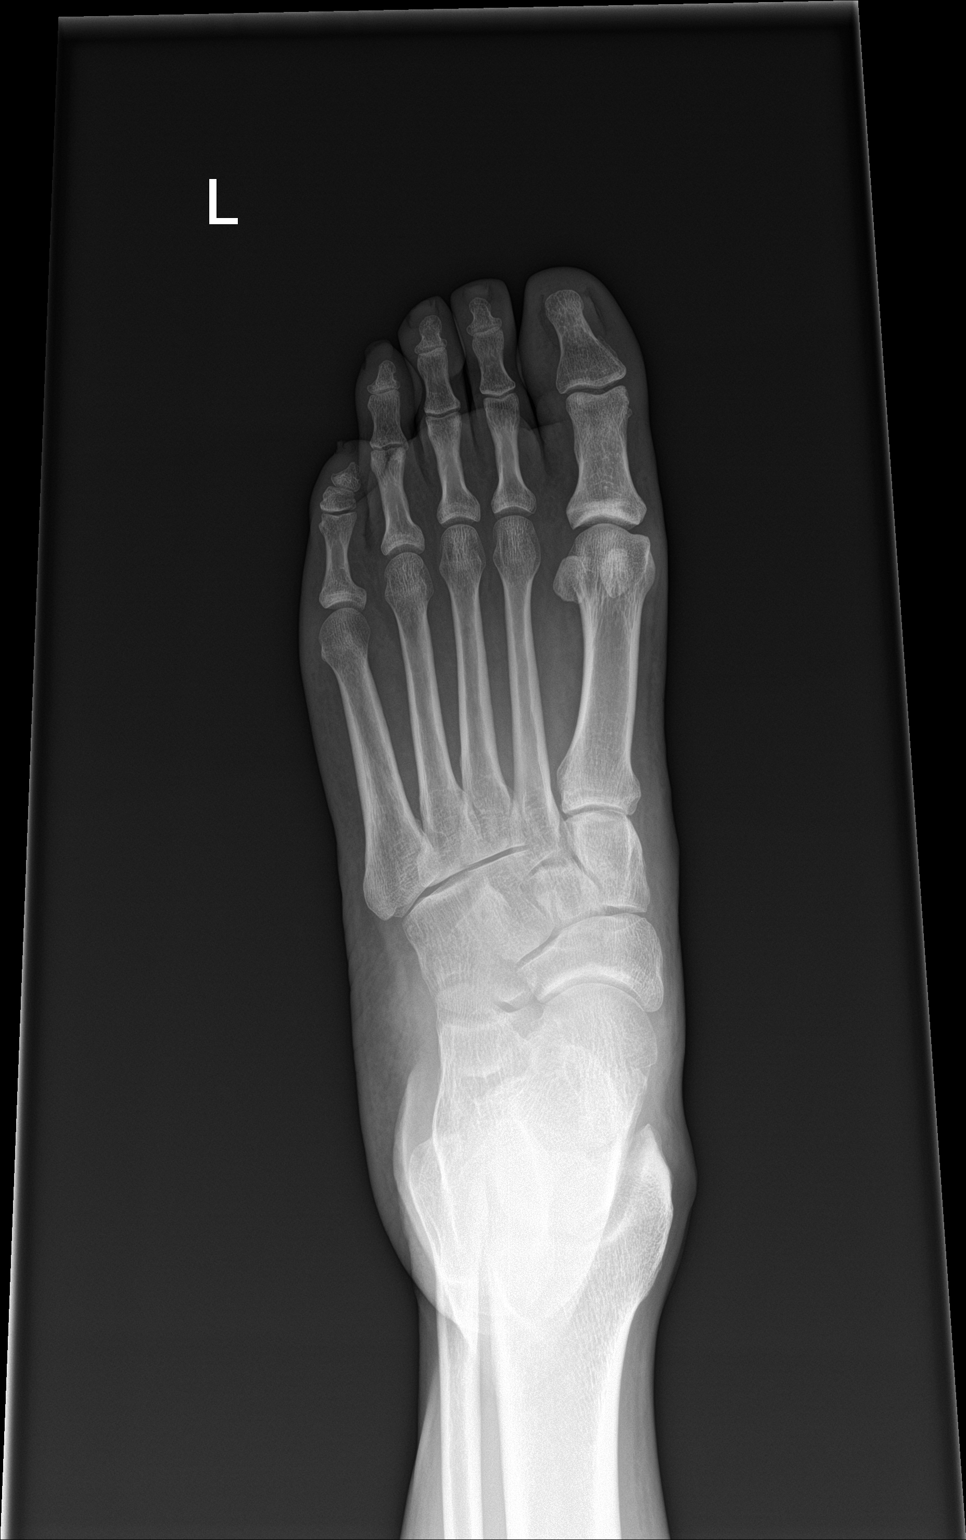

[foot obl]
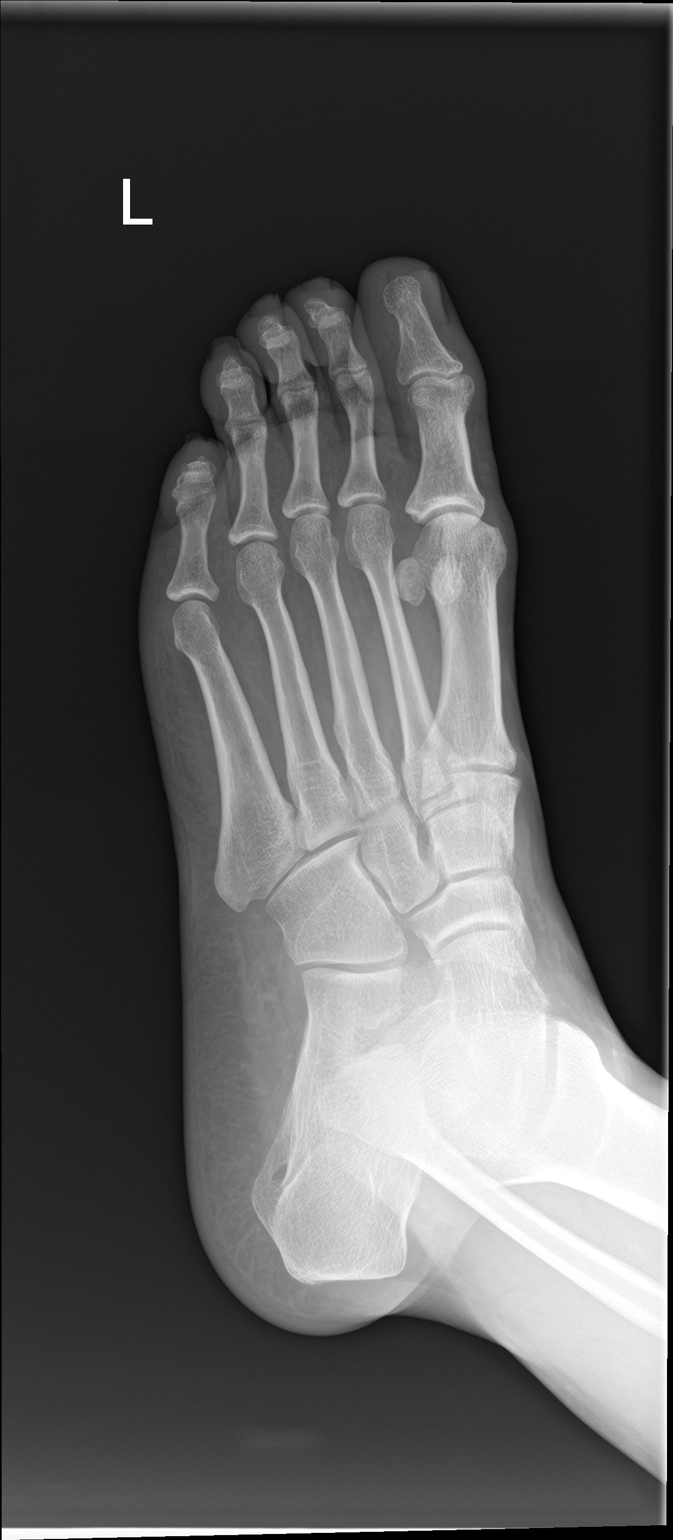

[foot lat]
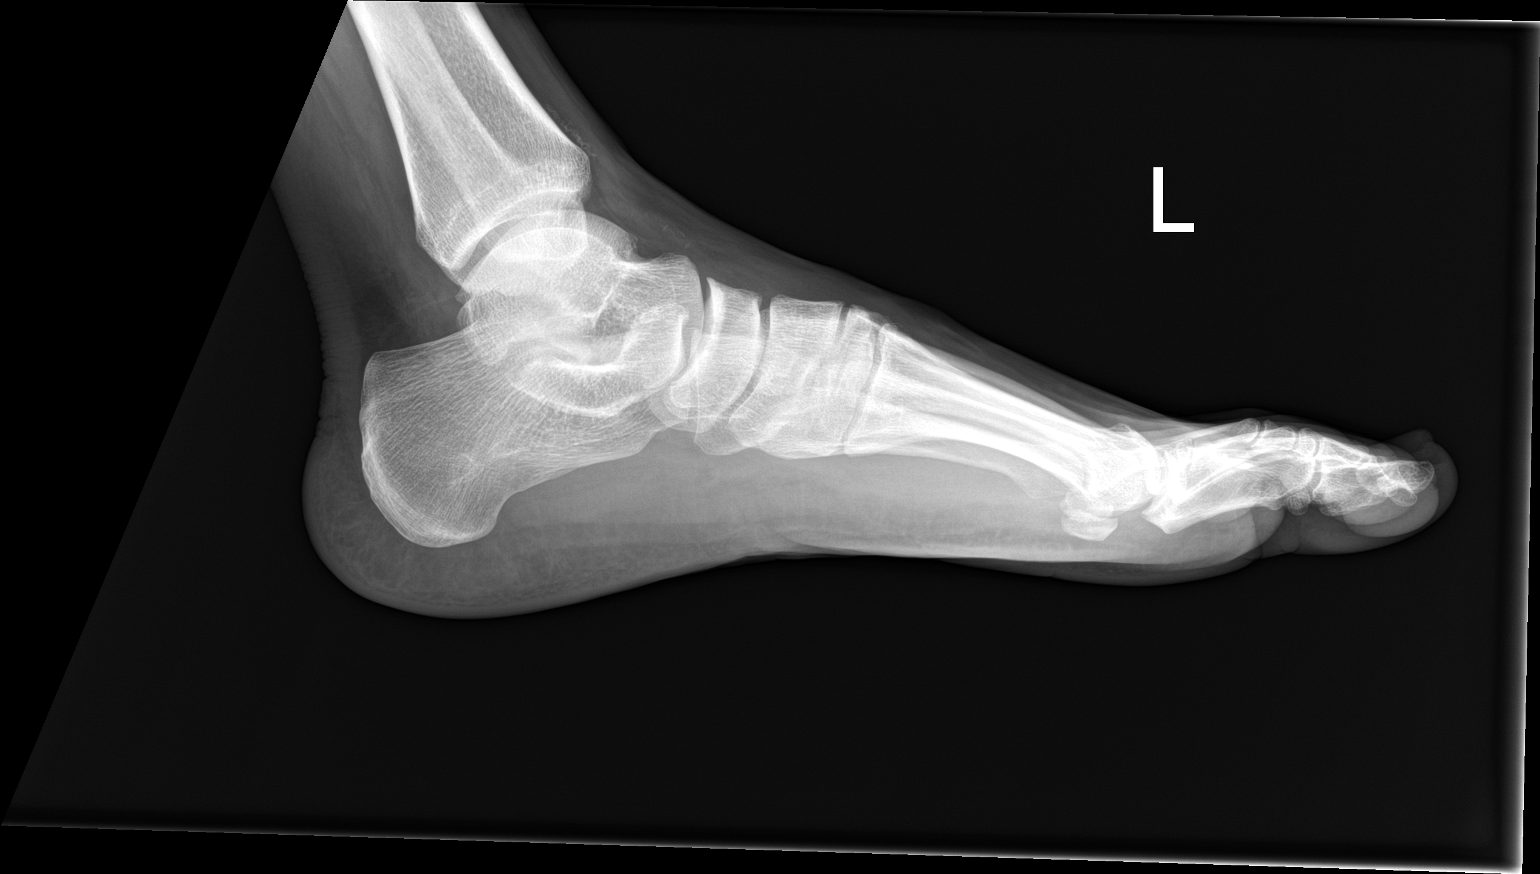

[3 of 3 positions shown; findings below may reference images not displayed]

FINDINGS: Bone mineralization is within normal limits. There is no evidence of
fracture or dislocation. There is no evidence of arthropathy or
other focal bone abnormality. No discrete soft tissue injury.
IMPRESSION: Negative.

## 2022-11-08 ENCOUNTER — Ambulatory Visit (HOSPITAL_COMMUNITY)
Admission: EM | Admit: 2022-11-08 | Discharge: 2022-11-08 | Disposition: A | Discharge status: Home / Self Care | Payer: Medicaid Other | Attending: Emergency Medicine | Admitting: Emergency Medicine

## 2022-11-08 ENCOUNTER — Encounter (HOSPITAL_COMMUNITY): Payer: Self-pay | Admitting: Emergency Medicine

## 2022-11-08 DIAGNOSIS — A084 Viral intestinal infection, unspecified: Secondary | ICD-10-CM | POA: Diagnosis not present

## 2022-11-08 MED ORDER — ONDANSETRON 4 MG PO TBDP: 4.0000 mg | ORAL_TABLET | Freq: Three times a day (TID) | ORAL | 0 refills | Status: DC | PRN

## 2022-11-08 MED ORDER — LOPERAMIDE HCL 2 MG PO CAPS: 2.0000 mg | ORAL_CAPSULE | Freq: Four times a day (QID) | ORAL | 0 refills | Status: DC | PRN

## 2022-11-08 NOTE — ED Provider Notes (Signed)
[facility]    CSN: [id number] Arrival date & time: 11/08/22  0808      History   Chief Complaint Chief Complaint  Patient presents with   Abdominal Pain   Emesis    HPI Scott Glenn is a 52 y.o. male.   Patient presents for evaluation of left lower abdominal pain, vomiting and diarrhea that began 1 day ago after eating a chicken patty.  Last occurrence of vomiting about 30 minutes ago, last occurrence of diarrhea this morning, diarrhea described as soft.  Difficulty tolerating food foods but tolerating liquids.  Has not attempted treatment.  Denies recent travel, fevers, URI symptoms.     Past Medical History:  Diagnosis Date   Asthma    Chicken pox    Migraines    Seasonal allergies     Patient Active Problem List   Diagnosis Date Noted   Stroke (HCC) 07/15/2022   Acute focal neurological deficit 07/15/2022   Left hemiparesis (HCC) 07/15/2022   Vitamin D deficiency 09/25/2020   Elevated blood pressure reading without diagnosis of hypertension 09/23/2020   Acute non intractable tension-type headache 09/23/2020   Psychophysiological insomnia 09/23/2020   Stress 09/23/2020   Fatigue 09/23/2020   ALLERGIC RHINITIS WITH CONJUNCTIVITIS 05/02/2008   ASTHMA 05/02/2008   GASTRITIS 03/03/2007    History reviewed. No pertinent surgical history.     Home Medications    Prior to Admission medications   Medication Sig Start Date End Date Taking? Authorizing Provider  albuterol (PROVENTIL) (2.5 MG/3ML) 0.083% nebulizer solution Take 2.5 mg by nebulization daily as needed for wheezing or shortness of breath.    [provider]  cholecalciferol (VITAMIN D3) 25 MCG (1000 UNIT) tablet Take 1,000 Units by mouth daily.    [provider]  EPINEPHrine (EPIPEN IJ) Inject 1 Pen as directed as needed (For allergic reaction).    [provider]  lisinopril (ZESTRIL) 5 MG tablet Take 1 tablet (5 mg total) by mouth daily. 08/22/22   Claiborne Rigg, NP    Family History Family History  Problem Relation Age of Onset   Hypertension Mother    Diabetes Mother    Stroke Father    Colon cancer Maternal Aunt    Colon polyps Neg Hx    Esophageal cancer Neg Hx    Stomach cancer Neg Hx    Rectal cancer Neg Hx     Social History Social History   Tobacco Use   Smoking status: Never   Smokeless tobacco: Never  Vaping Use   Vaping Use: Never used  Substance Use Topics   Alcohol use: Yes    Comment: occasionally   Drug use: Yes    Types: Marijuana     Allergies   Banana, Bee venom, and Pork-derived products   Review of Systems Review of Systems  Constitutional: Negative.   HENT: Negative.    Respiratory: Negative.    Cardiovascular: Negative.   Gastrointestinal:  Positive for abdominal distention, abdominal pain, diarrhea and vomiting. Negative for anal bleeding, blood in stool, constipation, nausea and rectal pain.  Skin: Negative.   Neurological: Negative.      Physical Exam Triage Vital Signs ED Triage Vitals  Enc Vitals Group     BP 11/08/22 0833 96/68     Pulse Rate 11/08/22 0833 69     Resp 11/08/22 0833 17     Temp 11/08/22 0833 98.1 F (36.7 C)     Temp Source 11/08/22 0833 Oral  SpO2 11/08/22 0833 97 %     Weight --      Height --      Head Circumference --      Peak Flow --      Pain Score 11/08/22 0832 6     Pain Loc --      Pain Edu? --      Excl. in GC? --    No data found.  Updated Vital Signs BP 96/68 (BP Location: Left Arm)   Pulse 69   Temp 98.1 F (36.7 C) (Oral)   Resp 17   SpO2 97%   Visual Acuity Right Eye Distance:   Left Eye Distance:   Bilateral Distance:    Right Eye Near:   Left Eye Near:    Bilateral Near:     Physical Exam Constitutional:      Appearance: Normal appearance. He is well-developed.  Pulmonary:     Effort: Pulmonary effort is normal.  Abdominal:     General: Abdomen is flat. Bowel sounds are increased.     Palpations: Abdomen is  soft.     Tenderness: There is abdominal tenderness in the left lower quadrant.  Skin:    General: Skin is warm and dry.  Neurological:     General: No focal deficit present.     Mental Status: He is alert and oriented to person, place, and time.      UC Treatments / Results  Labs (all labs ordered are listed, but only abnormal results are displayed) Labs Reviewed - No data to display  EKG   Radiology No results found.  Procedures Procedures (including critical care time)  Medications Ordered in UC Medications - No data to display  Initial Impression / Assessment and Plan / UC Course  I have reviewed the triage vital signs and the nursing notes.  Pertinent labs & imaging results that were available during my care of the patient were reviewed by me and considered in my medical decision making (see chart for details).  Viral gastroenteritis  Signs are stable patient is in no signs of distress nontoxic-appearing, mild tenderness with increased bowel sounds noted to the left lower quadrant, consistent with symptomology, advised to monitor, prescribe Zofran and Imodium for outpatient use and advised increase fluid intake with a bland diet as tolerated, may additionally use over-the-counter medications such as Pepto or Maalox, given strict precautions for worsening symptoms to follow-up for reevaluation, work note given Final Clinical Impressions(s) / UC Diagnoses   Final diagnoses:  None   Discharge Instructions   None    ED Prescriptions   None    PDMP not reviewed this encounter.   Valinda Hoar, [place] 11/08/22 [phone]

## 2022-11-08 NOTE — Discharge Instructions (Signed)
Your symptoms are most likely caused by a virus, it will work its way out your system over the next few days ? ?You can use zofran every 8 hours as needed for nausea, be mindful this medication may make you drowsy, take the first dose at home to see how it affects your body ? ?You can use Imodium to help with diarrhea, and be mindful over use of this medication may cause opposite effect constipation ? ?You can use over-the-counter ibuprofen or Tylenol, which ever you have at home, to help manage fevers ? ?Continue to promote hydration throughout the day by using electrolyte replacement solution such as Gatorade, body armor, Pedialyte, which ever you have at home ? ?Try eating bland foods such as bread, rice, toast, fruit which are easier on the stomach to digest, avoid foods that are overly spicy, overly seasoned or greasy  ?

## 2022-11-08 NOTE — ED Triage Notes (Addendum)
Pt c/o abd pain "feels like a marble on this side" and vomiting that started last night.  Pt adds that he ate a chicken patty last night that thinks was under cooked.

## 2022-11-22 ENCOUNTER — Other Ambulatory Visit: Payer: Self-pay | Admitting: Nurse Practitioner

## 2022-11-22 ENCOUNTER — Encounter: Payer: Self-pay | Admitting: Nurse Practitioner

## 2022-11-22 DIAGNOSIS — K219 Gastro-esophageal reflux disease without esophagitis: Secondary | ICD-10-CM

## 2022-11-22 MED ORDER — FAMOTIDINE 20 MG PO TABS: 20.0000 mg | ORAL_TABLET | Freq: Two times a day (BID) | ORAL | 0 refills | Status: AC

## 2022-12-29 ENCOUNTER — Encounter: Payer: Self-pay | Admitting: Nurse Practitioner

## 2022-12-29 ENCOUNTER — Ambulatory Visit: Payer: Medicaid Other | Attending: Nurse Practitioner | Admitting: Nurse Practitioner

## 2022-12-29 VITALS — BP 99/65 | HR 77 | Ht 69.0 in | Wt 163.6 lb

## 2022-12-29 DIAGNOSIS — I10 Essential (primary) hypertension: Secondary | ICD-10-CM

## 2022-12-29 DIAGNOSIS — Z23 Encounter for immunization: Secondary | ICD-10-CM | POA: Diagnosis not present

## 2022-12-29 DIAGNOSIS — D649 Anemia, unspecified: Secondary | ICD-10-CM | POA: Diagnosis not present

## 2022-12-29 DIAGNOSIS — E559 Vitamin D deficiency, unspecified: Secondary | ICD-10-CM | POA: Diagnosis not present

## 2022-12-29 DIAGNOSIS — S90212D Contusion of left great toe with damage to nail, subsequent encounter: Secondary | ICD-10-CM

## 2022-12-29 DIAGNOSIS — J4520 Mild intermittent asthma, uncomplicated: Secondary | ICD-10-CM

## 2022-12-29 DIAGNOSIS — Z91030 Bee allergy status: Secondary | ICD-10-CM

## 2022-12-29 MED ORDER — EPINEPHRINE 0.3 MG/0.3ML IJ SOAJ: 0.3000 mg | INTRAMUSCULAR | 1 refills | Status: AC | PRN

## 2022-12-29 MED ORDER — ALBUTEROL SULFATE HFA 108 (90 BASE) MCG/ACT IN AERS: 2.0000 | INHALATION_SPRAY | Freq: Four times a day (QID) | RESPIRATORY_TRACT | 2 refills | Status: AC | PRN

## 2022-12-29 NOTE — Progress Notes (Signed)
Assessment & Plan:  [name] was seen today for hypertension.  Diagnoses and all orders for this visit:  Primary hypertension -     CMP14+EGFR  Vitamin D deficiency disease -     VITAMIN D 25 Hydroxy (Vit-D Deficiency, Fractures)  Anemia, unspecified type -     CBC with Differential  Need for shingles vaccine -     Varicella-zoster vaccine IM    Patient has been counseled on age-appropriate routine health concerns for screening and prevention. These are reviewed and up-to-date. Referrals have been placed accordingly. Immunizations are up-to-date or declined.    Subjective:   Chief Complaint  Patient presents with   Hypertension    Scott Glenn 52 y.o. male presents to office today for follow up to HTN.   PMH: asthma, migraines, conversion disorder   Blood pressure is low normal with lisinopril 5 mg daily. He has not checked his blood pressure at home recently.  BP Readings from Last 3 Encounters:  12/29/22 99/65  11/08/22 96/68  09/17/22 122/81     He dropped a dresser on his left foot a few months ago. Still has subungual hematoma that is causing pain in the left great toenail.   Review of Systems  Constitutional:  Negative for fever, malaise/fatigue and weight loss.  HENT: Negative.  Negative for nosebleeds.   Eyes: Negative.  Negative for blurred vision, double vision and photophobia.  Respiratory: Negative.  Negative for cough and shortness of breath.   Cardiovascular: Negative.  Negative for chest pain, palpitations and leg swelling.  Gastrointestinal: Negative.  Negative for heartburn, nausea and vomiting.  Musculoskeletal: Negative.  Negative for myalgias.  Neurological: Negative.  Negative for dizziness, focal weakness, seizures and headaches.  Psychiatric/Behavioral: Negative.  Negative for suicidal ideas.     Past Medical History:  Diagnosis Date   Asthma    Chicken pox    Migraines    Seasonal allergies     No past surgical history on  file.  Family History  Problem Relation Age of Onset   Hypertension Mother    Diabetes Mother    Stroke Father    Colon cancer Maternal Aunt    Colon polyps Neg Hx    Esophageal cancer Neg Hx    Stomach cancer Neg Hx    Rectal cancer Neg Hx     Social History Reviewed with no changes to be made today.   Outpatient Medications Prior to Visit  Medication Sig Dispense Refill   albuterol (PROVENTIL) (2.5 MG/3ML) 0.083% nebulizer solution Take 2.5 mg by nebulization daily as needed for wheezing or shortness of breath.     famotidine (PEPCID) 20 MG tablet Take 1 tablet (20 mg total) by mouth 2 (two) times daily. 180 tablet 0   lisinopril (ZESTRIL) 5 MG tablet Take 1 tablet (5 mg total) by mouth daily. 90 tablet 3   cholecalciferol (VITAMIN D3) 25 MCG (1000 UNIT) tablet Take 1,000 Units by mouth daily. (Patient not taking: Reported on 12/29/2022)     EPINEPHrine (EPIPEN IJ) Inject 1 Pen as directed as needed (For allergic reaction). (Patient not taking: Reported on 12/29/2022)     ondansetron (ZOFRAN-ODT) 4 MG disintegrating tablet Take 1 tablet (4 mg total) by mouth every 8 (eight) hours as needed for nausea or vomiting. (Patient not taking: Reported on 12/29/2022) 20 tablet 0   loperamide (IMODIUM) 2 MG capsule Take 1 capsule (2 mg total) by mouth 4 (four) times daily as needed for diarrhea or loose stools. (  Patient not taking: Reported on 12/29/2022) 12 capsule 0   No facility-administered medications prior to visit.    Allergies  Allergen Reactions   Banana     Itching, dyspnea and nausea   Bee Venom    Pork-Derived Products     Headache, diarrhea and vomiting       Objective:    BP 99/65 (BP Location: Left Arm, Patient Position: Sitting, Cuff Size: Normal)   Pulse 77   Ht 5\' 9"  (1.753 m)   Wt 163 lb 9.6 oz (74.2 kg)   SpO2 97%   BMI 24.16 kg/m  Wt Readings from Last 3 Encounters:  12/29/22 163 lb 9.6 oz (74.2 kg)  09/17/22 168 lb 3.2 oz (76.3 kg)  08/24/22 170 lb 9.6 oz  (77.4 kg)    Physical Exam Vitals and nursing note reviewed.  Constitutional:      Appearance: He is well-developed.  HENT:     Head: Normocephalic and atraumatic.  Cardiovascular:     Rate and Rhythm: Normal rate and regular rhythm.     Heart sounds: Normal heart sounds. No murmur heard.    No friction rub. No gallop.  Pulmonary:     Effort: Pulmonary effort is normal. No tachypnea or respiratory distress.     Breath sounds: Normal breath sounds. No decreased breath sounds, wheezing, rhonchi or rales.  Chest:     Chest wall: No tenderness.  Abdominal:     General: Bowel sounds are normal.     Palpations: Abdomen is soft.  Musculoskeletal:        General: Normal range of motion.     Cervical back: Normal range of motion.  Feet:     Comments: Left great toenail subungual hematoma Skin:    General: Skin is warm and dry.  Neurological:     Mental Status: He is alert and oriented to person, place, and time.     Coordination: Coordination normal.  Psychiatric:        Behavior: Behavior normal. Behavior is cooperative.        Thought Content: Thought content normal.        Judgment: Judgment normal.          Patient has been counseled extensively about nutrition and exercise as well as the importance of adherence with medications and regular follow-up. The patient was given clear instructions to go to ER or return to medical center if symptoms don't improve, worsen or new problems develop. The patient verbalized understanding.   Follow-up: Return in about 6 months (around 06/30/2023).   Claiborne Rigg, FNP-BC [facility] [place], [place] [phone]   12/29/2022, 2:31 PM

## 2022-12-30 LAB — CMP14+EGFR
ALT: 24 IU/L (ref 0–44)
AST: 34 IU/L (ref 0–40)
Albumin: 5.1 g/dL — ABNORMAL HIGH (ref 3.8–4.9)
Alkaline Phosphatase: 69 IU/L (ref 44–121)
BUN/Creatinine Ratio: 15 (ref 9–20)
BUN: 17 mg/dL (ref 6–24)
Bilirubin Total: 2 mg/dL — ABNORMAL HIGH (ref 0.0–1.2)
CO2: 22 mmol/L (ref 20–29)
Calcium: 9.8 mg/dL (ref 8.7–10.2)
Chloride: 104 mmol/L (ref 96–106)
Creatinine, Ser: 1.14 mg/dL (ref 0.76–1.27)
Globulin, Total: 2.1 g/dL (ref 1.5–4.5)
Glucose: 81 mg/dL (ref 70–99)
Potassium: 4.1 mmol/L (ref 3.5–5.2)
Sodium: 143 mmol/L (ref 134–144)
Total Protein: 7.2 g/dL (ref 6.0–8.5)
eGFR: 78 mL/min/{1.73_m2} (ref >59)

## 2022-12-30 LAB — CBC WITH DIFFERENTIAL/PLATELET
Basophils Absolute: 0.1 10*3/uL (ref 0.0–0.2)
Basos: 1 %
EOS (ABSOLUTE): 0.2 10*3/uL (ref 0.0–0.4)
Eos: 4 %
Hematocrit: 41.7 % (ref 37.5–51.0)
Hemoglobin: 13.8 g/dL (ref 13.0–17.7)
Immature Grans (Abs): 0 10*3/uL (ref 0.0–0.1)
Immature Granulocytes: 1 %
Lymphocytes Absolute: 1.2 10*3/uL (ref 0.7–3.1)
Lymphs: 24 %
MCH: 30.7 pg (ref 26.6–33.0)
MCHC: 33.1 g/dL (ref 31.5–35.7)
MCV: 93 fL (ref 79–97)
Monocytes Absolute: 0.5 10*3/uL (ref 0.1–0.9)
Monocytes: 10 %
Neutrophils Absolute: 3.1 10*3/uL (ref 1.4–7.0)
Neutrophils: 60 %
Platelets: 228 10*3/uL (ref 150–450)
RBC: 4.49 x10E6/uL (ref 4.14–5.80)
RDW: 11.7 % (ref 11.6–15.4)
WBC: 5.1 10*3/uL (ref 3.4–10.8)

## 2022-12-30 LAB — VITAMIN D 25 HYDROXY (VIT D DEFICIENCY, FRACTURES): Vit D, 25-Hydroxy: 30 ng/mL (ref 30.0–100.0)

## 2023-01-12 ENCOUNTER — Ambulatory Visit (INDEPENDENT_AMBULATORY_CARE_PROVIDER_SITE_OTHER): Payer: Medicaid Other | Admitting: Podiatry

## 2023-01-12 ENCOUNTER — Encounter: Payer: Self-pay | Admitting: Podiatry

## 2023-01-12 DIAGNOSIS — S90112A Contusion of left great toe without damage to nail, initial encounter: Secondary | ICD-10-CM

## 2023-01-12 NOTE — Progress Notes (Signed)
  Subjective:  Patient ID: Scott Glenn, male    DOB: Nov 12, 1970,   MRN: [id number]  Chief Complaint  Patient presents with   Toe Injury    Hallux left - dropped a dresser on the toe x 2 weeks, bruised and sore within the nail bed   New Patient (Initial Visit)    52 y.o. male presents for concern as above.  . Denies any other pedal complaints. Denies n/v/f/c.   Past Medical History:  Diagnosis Date   Asthma    Chicken pox    Migraines    Seasonal allergies     Objective:  Physical Exam: Vascular: DP/PT pulses 2/4 bilateral. CFT <3 seconds. Normal hair growth on digits. No edema.  Skin. No lacerations or abrasions bilateral feet. Left hallux with hemorrhage and blacked discoloration underneath. No erythema or edema surrounding toe. Minimal pain to palpation.  Musculoskeletal: MMT 5/5 bilateral lower extremities in DF, PF, Inversion and Eversion. Deceased ROM in DF of ankle joint.  Neurological: Sensation intact to light touch.   Assessment:   1. Contusion of left great toe without damage to nail, initial encounter      Plan:  Patient was evaluated and treated and all questions answered. -Discussed treatement options for toe injury ; risks, alternatives, and benefits explained. -Discussed toe appears stable and nail intact. Discussed it is possible that nail will fall off and did discuss removal of nail but patient would like to hold off for now.  -Recommend protection, rest, ice, elevation daily until symptoms improve -Rx pain med/antinflammatories as needed -Patient to return to office as needed.   Louann Sjogren, DPM

## 2023-03-25 ENCOUNTER — Ambulatory Visit (HOSPITAL_COMMUNITY)
Admission: EM | Admit: 2023-03-25 | Discharge: 2023-03-25 | Disposition: A | Discharge status: Home / Self Care | Payer: Self-pay | Attending: Family Medicine | Admitting: Family Medicine

## 2023-03-25 ENCOUNTER — Encounter (HOSPITAL_COMMUNITY): Payer: Self-pay

## 2023-03-25 DIAGNOSIS — R112 Nausea with vomiting, unspecified: Secondary | ICD-10-CM

## 2023-03-25 DIAGNOSIS — R197 Diarrhea, unspecified: Secondary | ICD-10-CM

## 2023-03-25 NOTE — Discharge Instructions (Signed)
You were seen today for nausea, vomiting and diarrhea.   Please make sure you stay well hydrated, even if you don't feel like eating anything.  Please return if worsening or not improving.

## 2023-03-25 NOTE — ED Triage Notes (Signed)
Pt c/o n/v/d since Wednesday night. States able to keep fluids down. States needs a work note.

## 2023-03-25 NOTE — ED Provider Notes (Signed)
[facility]    CSN: [id number] Arrival date & time: 03/25/23  0803      History   Chief Complaint Chief Complaint  Patient presents with   Emesis    HPI Scott Glenn is a 52 y.o. male.    Emesis Associated symptoms: diarrhea    Patient is here for n/v/diarrhea.  Started 2 days ago.  Caught it from his granddaughter who was sick.  Yesterday was bad, today is better.  He is able to drink okay, not feeling like eating.  He does not want zofran.  He is asking for a note for work.  No fevers/chills.  No bloody stool or emesis.        Past Medical History:  Diagnosis Date   Asthma    Chicken pox    Migraines    Seasonal allergies     Patient Active Problem List   Diagnosis Date Noted   Stroke (HCC) 07/15/2022   Acute focal neurological deficit 07/15/2022   Left hemiparesis (HCC) 07/15/2022   Vitamin D deficiency 09/25/2020   Elevated blood pressure reading without diagnosis of hypertension 09/23/2020   Acute non intractable tension-type headache 09/23/2020   Psychophysiological insomnia 09/23/2020   Stress 09/23/2020   Fatigue 09/23/2020   ALLERGIC RHINITIS WITH CONJUNCTIVITIS 05/02/2008   ASTHMA 05/02/2008   GASTRITIS 03/03/2007    History reviewed. No pertinent surgical history.     Home Medications    Prior to Admission medications   Medication Sig Start Date End Date Taking? Authorizing Provider  albuterol (PROVENTIL) (2.5 MG/3ML) 0.083% nebulizer solution Take 2.5 mg by nebulization daily as needed for wheezing or shortness of breath.    [provider]  albuterol (VENTOLIN HFA) 108 (90 Base) MCG/ACT inhaler Inhale 2 puffs into the lungs every 6 (six) hours as needed for wheezing or shortness of breath. 12/29/22   Claiborne Rigg, NP  EPINEPHrine 0.3 mg/0.3 mL IJ SOAJ injection Inject 0.3 mg into the muscle as needed for anaphylaxis. 12/29/22   Claiborne Rigg, NP  famotidine (PEPCID) 20 MG tablet Take 1 tablet (20 mg total)  by mouth 2 (two) times daily. 11/22/22   Claiborne Rigg, NP  lisinopril (ZESTRIL) 5 MG tablet Take 1 tablet (5 mg total) by mouth daily. 08/22/22   Claiborne Rigg, NP    Family History Family History  Problem Relation Age of Onset   Hypertension Mother    Diabetes Mother    Stroke Father    Colon cancer Maternal Aunt    Colon polyps Neg Hx    Esophageal cancer Neg Hx    Stomach cancer Neg Hx    Rectal cancer Neg Hx     Social History Social History   Tobacco Use   Smoking status: Never   Smokeless tobacco: Never  Vaping Use   Vaping status: Never Used  Substance Use Topics   Alcohol use: Yes    Comment: occasionally   Drug use: Yes    Types: Marijuana     Allergies   Banana, Bee venom, and Pork-derived products   Review of Systems Review of Systems  Constitutional: Negative.   HENT: Negative.    Respiratory: Negative.    Cardiovascular: Negative.   Gastrointestinal:  Positive for diarrhea and vomiting.  Musculoskeletal: Negative.   Skin: Negative.      Physical Exam Triage Vital Signs ED Triage Vitals [03/25/23 0820]  Encounter Vitals Group     BP 128/88     Systolic  BP Percentile      Diastolic BP Percentile      Pulse Rate 63     Resp 18     Temp 97.8 F (36.6 C)     Temp Source Oral     SpO2 98 %     Weight      Height      Head Circumference      Peak Flow      Pain Score 0     Pain Loc      Pain Education      Exclude from Growth Chart    No data found.  Updated Vital Signs BP 128/88 (BP Location: Left Arm)   Pulse 63   Temp 97.8 F (36.6 C) (Oral)   Resp 18   SpO2 98%   Visual Acuity Right Eye Distance:   Left Eye Distance:   Bilateral Distance:    Right Eye Near:   Left Eye Near:    Bilateral Near:     Physical Exam Constitutional:      Appearance: Normal appearance.  HENT:     Mouth/Throat:     Mouth: Mucous membranes are moist.  Cardiovascular:     Rate and Rhythm: Normal rate and regular rhythm.  Pulmonary:      Effort: Pulmonary effort is normal.     Breath sounds: Normal breath sounds.  Abdominal:     Palpations: Abdomen is soft.     Tenderness: There is no guarding or rebound.     Comments: Very mild diffuse tenderness  Musculoskeletal:     Cervical back: Normal range of motion and neck supple.  Skin:    General: Skin is warm.  Neurological:     General: No focal deficit present.     Mental Status: He is alert.  Psychiatric:        Mood and Affect: Mood normal.      UC Treatments / Results  Labs (all labs ordered are listed, but only abnormal results are displayed) Labs Reviewed - No data to display  EKG   Radiology No results found.  Procedures Procedures (including critical care time)  Medications Ordered in UC Medications - No data to display  Initial Impression / Assessment and Plan / UC Course  I have reviewed the triage vital signs and the nursing notes.  Pertinent labs & imaging results that were available during my care of the patient were reviewed by me and considered in my medical decision making (see chart for details).   Final Clinical Impressions(s) / UC Diagnoses   Final diagnoses:  Nausea vomiting and diarrhea     Discharge Instructions      You were seen today for nausea, vomiting and diarrhea.   Please make sure you stay well hydrated, even if you don't feel like eating anything.  Please return if worsening or not improving.      ED Prescriptions   None    PDMP not reviewed this encounter.   Jannifer Franklin, MD 03/25/23 [phone]

## 2023-04-12 ENCOUNTER — Encounter (HOSPITAL_COMMUNITY): Payer: Self-pay | Admitting: Emergency Medicine

## 2023-04-12 ENCOUNTER — Ambulatory Visit (HOSPITAL_COMMUNITY)
Admission: EM | Admit: 2023-04-12 | Discharge: 2023-04-12 | Disposition: A | Discharge status: Home / Self Care | Payer: Managed Care, Other (non HMO) | Attending: Family Medicine | Admitting: Family Medicine

## 2023-04-12 DIAGNOSIS — M5442 Lumbago with sciatica, left side: Secondary | ICD-10-CM | POA: Diagnosis not present

## 2023-04-12 MED ORDER — KETOROLAC TROMETHAMINE 30 MG/ML IJ SOLN
INTRAMUSCULAR | Status: AC
  Filled 2023-04-12: qty 1

## 2023-04-12 MED ORDER — KETOROLAC TROMETHAMINE 10 MG PO TABS: 10.0000 mg | ORAL_TABLET | Freq: Four times a day (QID) | ORAL | 0 refills | Status: AC | PRN

## 2023-04-12 MED ORDER — KETOROLAC TROMETHAMINE 30 MG/ML IJ SOLN
30.0000 mg | Freq: Once | INTRAMUSCULAR | Status: AC
  Administered 2023-04-12: 30 mg via INTRAMUSCULAR

## 2023-04-12 MED ORDER — TIZANIDINE HCL 4 MG PO TABS: 4.0000 mg | ORAL_TABLET | Freq: Three times a day (TID) | ORAL | 0 refills | Status: DC | PRN

## 2023-04-12 NOTE — ED Provider Notes (Addendum)
[facility]    CSN: [id number] Arrival date & time: 04/12/23  0865      History   Chief Complaint Chief Complaint  Patient presents with   Back Pain    HPI Scott Glenn is a 52 y.o. male.    Back Pain Here for low back pain that began 10/5. Radiates into posterior left calf. Pain is in the left low back. Is having numbness/dull/achy in the left flank. No numbness in the leg.  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 Goody powder does help a little.  No f/c. No trauma or fall.  No B/B incontinence. Pain today is rated as 7-8/10.  He notes a history of a stroke in January.  He is not taking Plavix or other blood thinners.  Of note the MRI at that time showed a remote infarct but nothing acute in January.  Past Medical History:  Diagnosis Date   Asthma    Chicken pox    Migraines    Seasonal allergies     Patient Active Problem List   Diagnosis Date Noted   Stroke (HCC) 07/15/2022   Acute focal neurological deficit 07/15/2022   Left hemiparesis (HCC) 07/15/2022   Vitamin D deficiency 09/25/2020   Elevated blood pressure reading without diagnosis of hypertension 09/23/2020   Acute non intractable tension-type headache 09/23/2020   Psychophysiological insomnia 09/23/2020   Stress 09/23/2020   Fatigue 09/23/2020   ALLERGIC RHINITIS WITH CONJUNCTIVITIS 05/02/2008   ASTHMA 05/02/2008   GASTRITIS 03/03/2007    History reviewed. No pertinent surgical history.     Home Medications    Prior to Admission medications   Medication Sig Start Date End Date Taking? Authorizing Provider  ketorolac (TORADOL) 10 MG tablet Take 1 tablet (10 mg total) by mouth every 6 (six) hours as needed (pain). 04/12/23  Yes Jaxin Fulfer, Janace Aris, MD  tiZANidine (ZANAFLEX) 4 MG tablet Take 1 tablet (4 mg total) by mouth every 8 (eight) hours as needed for muscle spasms. 04/12/23  Yes Zenia Resides, MD  albuterol (PROVENTIL) (2.5 MG/3ML) 0.083% nebulizer  solution Take 2.5 mg by nebulization daily as needed for wheezing or shortness of breath.    [provider]  albuterol (VENTOLIN HFA) 108 (90 Base) MCG/ACT inhaler Inhale 2 puffs into the lungs every 6 (six) hours as needed for wheezing or shortness of breath. 12/29/22   Claiborne Rigg, NP  EPINEPHrine 0.3 mg/0.3 mL IJ SOAJ injection Inject 0.3 mg into the muscle as needed for anaphylaxis. 12/29/22   Claiborne Rigg, NP  famotidine (PEPCID) 20 MG tablet Take 1 tablet (20 mg total) by mouth 2 (two) times daily. 11/22/22   Claiborne Rigg, NP  lisinopril (ZESTRIL) 5 MG tablet Take 1 tablet (5 mg total) by mouth daily. 08/22/22   Claiborne Rigg, NP    Family History Family History  Problem Relation Age of Onset   Hypertension Mother    Diabetes Mother    Stroke Father    Colon cancer Maternal Aunt    Colon polyps Neg Hx    Esophageal cancer Neg Hx    Stomach cancer Neg Hx    Rectal cancer Neg Hx     Social History Social History   Tobacco Use   Smoking status: Never   Smokeless tobacco: Never  Vaping Use   Vaping status: Never Used  Substance Use Topics   Alcohol use: Yes    Comment: occasionally   Drug use:  Yes    Types: Marijuana     Allergies   Banana, Bee venom, and Pork-derived products   Review of Systems Review of Systems  Musculoskeletal:  Positive for back pain.     Physical Exam Triage Vital Signs ED Triage Vitals  Encounter Vitals Group     BP 04/12/23 0841 132/86     Systolic BP Percentile --      Diastolic BP Percentile --      Pulse Rate 04/12/23 0841 66     Resp 04/12/23 0841 16     Temp 04/12/23 0841 98.4 F (36.9 C)     Temp Source 04/12/23 0841 Oral     SpO2 04/12/23 0841 98 %     Weight --      Height --      Head Circumference --      Peak Flow --      Pain Score 04/12/23 0840 8     Pain Loc --      Pain Education --      Exclude from Growth Chart --    No data found.  Updated Vital Signs BP 132/86 (BP Location: Left  Arm)   Pulse 66   Temp 98.4 F (36.9 C) (Oral)   Resp 16   SpO2 98%   Visual Acuity Right Eye Distance:   Left Eye Distance:   Bilateral Distance:    Right Eye Near:   Left Eye Near:    Bilateral Near:     Physical Exam Vitals reviewed.  Constitutional:      General: He is not in acute distress.    Appearance: He is not ill-appearing, toxic-appearing or diaphoretic.  HENT:     Mouth/Throat:     Mouth: Mucous membranes are moist.  Eyes:     Extraocular Movements: Extraocular movements intact.     Pupils: Pupils are equal, round, and reactive to light.  Cardiovascular:     Rate and Rhythm: Normal rate and regular rhythm.     Heart sounds: No murmur heard. Pulmonary:     Effort: Pulmonary effort is normal.     Breath sounds: Normal breath sounds.  Musculoskeletal:     Cervical back: Neck supple.     Comments: Straight leg raise causes pain to radiate into his left calf.  There is no rash noted.  There is some tenderness over the LS area on the left.  Lymphadenopathy:     Cervical: No cervical adenopathy.  Skin:    Coloration: Skin is not pale.     Findings: No rash.  Neurological:     General: No focal deficit present.     Mental Status: He is alert and oriented to person, place, and time.  Psychiatric:        Behavior: Behavior normal.      UC Treatments / Results  Labs (all labs ordered are listed, but only abnormal results are displayed) Labs Reviewed - No data to display  EKG   Radiology No results found.  Procedures Procedures (including critical care time)  Medications Ordered in UC Medications  ketorolac (TORADOL) 30 MG/ML injection 30 mg (has no administration in time range)    Initial Impression / Assessment and Plan / UC Course  I have reviewed the triage vital signs and the nursing notes.  Pertinent labs & imaging results that were available during my care of the patient were reviewed by me and considered in my medical decision making  (see chart for details).  Toradol injection is given here in Toradol tablets are sent to the pharmacy.  Also tizanidine is sent in . Work note is provided.  I have asked him to follow-up with his primary care Final Clinical Impressions(s) / UC Diagnoses   Final diagnoses:  Acute left-sided low back pain with left-sided sciatica     Discharge Instructions      You have been given a shot of Toradol 30 mg today.  Ketorolac 10 mg tablets--take 1 tablet every 6 hours as needed for pain.  This is the same medicine that is in the shot we just gave you   Take tizanidine 4 mg--1 every 8 hours as needed for muscle spasms; this medication can cause dizziness and sleepiness  Heating pad can help the soreness in your back  Please follow-up with your primary care about this issue     ED Prescriptions     Medication Sig Dispense Auth. Provider   ketorolac (TORADOL) 10 MG tablet Take 1 tablet (10 mg total) by mouth every 6 (six) hours as needed (pain). 20 tablet Elisabeth Strom, Janace Aris, MD   tiZANidine (ZANAFLEX) 4 MG tablet Take 1 tablet (4 mg total) by mouth every 8 (eight) hours as needed for muscle spasms. 15 tablet Jobani Sabado, Janace Aris, MD      PDMP not reviewed this encounter.   Zenia Resides, MD 04/12/23 [phone]    Zenia Resides, MD 04/12/23 5621    Zenia Resides, MD 04/12/23 [phone]

## 2023-04-12 NOTE — Discharge Instructions (Signed)
You have been given a shot of Toradol 30 mg today.  Ketorolac 10 mg tablets--take 1 tablet every 6 hours as needed for pain.  This is the same medicine that is in the shot we just gave you   Take tizanidine 4 mg--1 every 8 hours as needed for muscle spasms; this medication can cause dizziness and sleepiness  Heating pad can help the soreness in your back  Please follow-up with your primary care about this issue

## 2023-04-12 NOTE — ED Triage Notes (Signed)
Saturday morning started having lower back pains. Pain radiates down left leg. Pt does heavy lifting at work. Took Goody back and body which helped numb pain little bit. Denies urinary or bowel problems.

## 2023-06-21 DIAGNOSIS — Z833 Family history of diabetes mellitus: Secondary | ICD-10-CM | POA: Diagnosis not present

## 2023-06-21 DIAGNOSIS — Z8249 Family history of ischemic heart disease and other diseases of the circulatory system: Secondary | ICD-10-CM | POA: Diagnosis not present

## 2023-06-21 DIAGNOSIS — F1221 Cannabis dependence, in remission: Secondary | ICD-10-CM | POA: Diagnosis not present

## 2023-06-21 DIAGNOSIS — Z8673 Personal history of transient ischemic attack (TIA), and cerebral infarction without residual deficits: Secondary | ICD-10-CM | POA: Diagnosis not present

## 2023-06-21 DIAGNOSIS — Z87891 Personal history of nicotine dependence: Secondary | ICD-10-CM | POA: Diagnosis not present

## 2023-06-21 DIAGNOSIS — M5430 Sciatica, unspecified side: Secondary | ICD-10-CM | POA: Diagnosis not present

## 2023-06-21 DIAGNOSIS — I10 Essential (primary) hypertension: Secondary | ICD-10-CM | POA: Diagnosis not present

## 2023-08-02 ENCOUNTER — Encounter (HOSPITAL_COMMUNITY): Payer: Self-pay | Admitting: *Deleted

## 2023-08-02 ENCOUNTER — Ambulatory Visit (HOSPITAL_COMMUNITY)
Admission: EM | Admit: 2023-08-02 | Discharge: 2023-08-02 | Disposition: A | Discharge status: Home / Self Care | Payer: 59 | Attending: Family Medicine | Admitting: Family Medicine

## 2023-08-02 DIAGNOSIS — M25512 Pain in left shoulder: Secondary | ICD-10-CM | POA: Diagnosis not present

## 2023-08-02 MED ORDER — TIZANIDINE HCL 4 MG PO TABS: 4.0000 mg | ORAL_TABLET | Freq: Four times a day (QID) | ORAL | 0 refills | Status: AC | PRN

## 2023-08-02 NOTE — ED Provider Notes (Signed)
[facility]    CSN: [id number] Arrival date & time: 08/02/23  0801      History   Chief Complaint Chief Complaint  Patient presents with   Shoulder Pain    HPI Scott COCKERELL is a 53 y.o. male.    Shoulder Pain  Patient is here for left shoulder pain x 1 week. Not sure if he had an injury to the shoulder.  Not sure if he slept wrong or picked up something wrong.  He has been taking motrin and left over toradol with minimal help.        Past Medical History:  Diagnosis Date   Asthma    Chicken pox    Migraines    Seasonal allergies     Patient Active Problem List   Diagnosis Date Noted   Stroke (HCC) 07/15/2022   Acute focal neurological deficit 07/15/2022   Left hemiparesis (HCC) 07/15/2022   Vitamin D deficiency 09/25/2020   Elevated blood pressure reading without diagnosis of hypertension 09/23/2020   Acute non intractable tension-type headache 09/23/2020   Psychophysiological insomnia 09/23/2020   Stress 09/23/2020   Fatigue 09/23/2020   ALLERGIC RHINITIS WITH CONJUNCTIVITIS 05/02/2008   ASTHMA 05/02/2008   GASTRITIS 03/03/2007    History reviewed. No pertinent surgical history.     Home Medications    Prior to Admission medications   Medication Sig Start Date End Date Taking? Authorizing Provider  ketorolac (TORADOL) 10 MG tablet Take 1 tablet (10 mg total) by mouth every 6 (six) hours as needed (pain). 04/12/23  Yes Zenia Resides, MD  lisinopril (ZESTRIL) 5 MG tablet Take 1 tablet (5 mg total) by mouth daily. 08/22/22  Yes Claiborne Rigg, NP  albuterol (PROVENTIL) (2.5 MG/3ML) 0.083% nebulizer solution Take 2.5 mg by nebulization daily as needed for wheezing or shortness of breath.    [provider]  albuterol (VENTOLIN HFA) 108 (90 Base) MCG/ACT inhaler Inhale 2 puffs into the lungs every 6 (six) hours as needed for wheezing or shortness of breath. 12/29/22   Claiborne Rigg, NP  EPINEPHrine 0.3 mg/0.3 mL IJ SOAJ  injection Inject 0.3 mg into the muscle as needed for anaphylaxis. 12/29/22   Claiborne Rigg, NP  famotidine (PEPCID) 20 MG tablet Take 1 tablet (20 mg total) by mouth 2 (two) times daily. 11/22/22   Claiborne Rigg, NP  tiZANidine (ZANAFLEX) 4 MG tablet Take 1 tablet (4 mg total) by mouth every 8 (eight) hours as needed for muscle spasms. 04/12/23   Zenia Resides, MD    Family History Family History  Problem Relation Age of Onset   Hypertension Mother    Diabetes Mother    Stroke Father    Colon cancer Maternal Aunt    Colon polyps Neg Hx    Esophageal cancer Neg Hx    Stomach cancer Neg Hx    Rectal cancer Neg Hx     Social History Social History   Tobacco Use   Smoking status: Never   Smokeless tobacco: Never  Vaping Use   Vaping status: Never Used  Substance Use Topics   Alcohol use: Yes    Comment: occasionally   Drug use: Yes    Types: Marijuana     Allergies   Banana, Bee venom, and Pork-derived products   Review of Systems Review of Systems  Constitutional: Negative.   HENT: Negative.    Respiratory: Negative.    Cardiovascular: Negative.   Gastrointestinal: Negative.   Musculoskeletal:  Positive for myalgias.     Physical Exam Triage Vital Signs ED Triage Vitals  Encounter Vitals Group     BP 08/02/23 0824 (!) 142/100     Systolic BP Percentile --      Diastolic BP Percentile --      Pulse Rate 08/02/23 0824 67     Resp 08/02/23 0824 16     Temp 08/02/23 0824 98.1 F (36.7 C)     Temp Source 08/02/23 0824 Oral     SpO2 08/02/23 0824 97 %     Weight --      Height --      Head Circumference --      Peak Flow --      Pain Score 08/02/23 0823 7     Pain Loc --      Pain Education --      Exclude from Growth Chart --    No data found.  Updated Vital Signs BP (!) 142/100 (BP Location: Left Arm) Comment: did not take BP meds,  Pulse 67   Temp 98.1 F (36.7 C) (Oral)   Resp 16   SpO2 97%   Visual Acuity Right Eye Distance:    Left Eye Distance:   Bilateral Distance:    Right Eye Near:   Left Eye Near:    Bilateral Near:     Physical Exam Constitutional:      Appearance: Normal appearance. He is normal weight.  Cardiovascular:     Rate and Rhythm: Normal rate and regular rhythm.  Pulmonary:     Effort: Pulmonary effort is normal.     Breath sounds: Normal breath sounds.  Musculoskeletal:     Comments: No spinous tenderness;  no TTP to the anterior shoulder;  he has TTP to the base of the neck on the left, and into the left shoulder blade;  Full rom of the neck and shoulder;   Neurological:     General: No focal deficit present.     Mental Status: He is alert.  Psychiatric:        Mood and Affect: Mood normal.      UC Treatments / Results  Labs (all labs ordered are listed, but only abnormal results are displayed) Labs Reviewed - No data to display  EKG   Radiology No results found.  Procedures Procedures (including critical care time)  Medications Ordered in UC Medications - No data to display  Initial Impression / Assessment and Plan / UC Course  I have reviewed the triage vital signs and the nursing notes.  Pertinent labs & imaging results that were available during my care of the patient were reviewed by me and considered in my medical decision making (see chart for details).   Final Clinical Impressions(s) / UC Diagnoses   Final diagnoses:  Acute pain of left shoulder     Discharge Instructions      You were seen today for shoulder pain.  I have sent out a muscle relaxer to take for pain.  Take when home and not driving as it may make you tired/drowsy.  Use a heating pad to the area as well.  You may continue motrin/ibuprofen for pain.  Follow up if not improving.     ED Prescriptions     Medication Sig Dispense Auth. Provider   tiZANidine (ZANAFLEX) 4 MG tablet Take 1 tablet (4 mg total) by mouth every 6 (six) hours as needed for muscle spasms. 30 tablet Jannifer Franklin, MD  PDMP not reviewed this encounter.   Jannifer Franklin, MD 08/02/23 [phone]

## 2023-08-02 NOTE — ED Triage Notes (Signed)
Pt states he has left shoulder pain X 1 week. He is unsure what happened to cause this, maybe picking up something at work. He has been taking Tordal, IBU, goodys nothing is working longer than 30 mins to control the pain.

## 2023-08-02 NOTE — Discharge Instructions (Signed)
You were seen today for shoulder pain.  I have sent out a muscle relaxer to take for pain.  Take when home and not driving as it may make you tired/drowsy.  Use a heating pad to the area as well.  You may continue motrin/ibuprofen for pain.  Follow up if not improving.

## 2024-05-08 ENCOUNTER — Other Ambulatory Visit: Payer: Self-pay

## 2024-05-08 ENCOUNTER — Encounter (HOSPITAL_COMMUNITY): Payer: Self-pay

## 2024-05-08 ENCOUNTER — Emergency Department (HOSPITAL_COMMUNITY)
Admission: EM | Admit: 2024-05-08 | Discharge: 2024-05-08 | Disposition: A | Discharge status: Home / Self Care | Attending: Emergency Medicine | Admitting: Emergency Medicine

## 2024-05-08 DIAGNOSIS — S81852A Open bite, left lower leg, initial encounter: Secondary | ICD-10-CM | POA: Insufficient documentation

## 2024-05-08 DIAGNOSIS — W540XXA Bitten by dog, initial encounter: Secondary | ICD-10-CM | POA: Diagnosis not present

## 2024-05-08 DIAGNOSIS — Z23 Encounter for immunization: Secondary | ICD-10-CM | POA: Insufficient documentation

## 2024-05-08 HISTORY — DX: Cerebral infarction, unspecified: I63.9

## 2024-05-08 MED ORDER — DOXYCYCLINE HYCLATE 100 MG PO CAPS: 100.0000 mg | ORAL_CAPSULE | Freq: Two times a day (BID) | ORAL | 0 refills | Status: AC

## 2024-05-08 MED ORDER — TETANUS-DIPHTH-ACELL PERTUSSIS 5-2-15.5 LF-MCG/0.5 IM SUSP
0.5000 mL | Freq: Once | INTRAMUSCULAR | Status: AC
  Administered 2024-05-08: 0.5 mL via INTRAMUSCULAR
  Filled 2024-05-08: qty 0.5

## 2024-05-08 MED ORDER — AMOXICILLIN-POT CLAVULANATE 875-125 MG PO TABS: 1.0000 | ORAL_TABLET | Freq: Two times a day (BID) | ORAL | 0 refills | Status: AC

## 2024-05-08 MED ORDER — KETOROLAC TROMETHAMINE 60 MG/2ML IM SOLN
15.0000 mg | Freq: Once | INTRAMUSCULAR | Status: AC
  Administered 2024-05-08: 15 mg via INTRAMUSCULAR
  Filled 2024-05-08: qty 2

## 2024-05-08 NOTE — ED Triage Notes (Signed)
 Pt to er, pt states that he got bit by his daughters dog on Friday, states that he is unsure if the dog is up to date on it's shots, states that he tried to clean it out with peroxide; however, he is here today for some increased redness and pain.  Pt states that it also hurts to walk.

## 2024-05-08 NOTE — Discharge Instructions (Addendum)
 It was a pleasure meeting with you today. Mark the surrounding red area with a pen/marker, if redness continues to spread or pain continues to worsen, please return to the ED.  Please use Tylenol  or ibuprofen  for pain.  You may use 600 mg ibuprofen  every 6 hours or 1000 mg of Tylenol  every 6 hours.  You may choose to alternate between the 2.  This would be most effective.  Not to exceed 4 g of Tylenol  within 24 hours.  Not to exceed 3200 mg ibuprofen  24 hours.  Monitor your dog for the next 10 days. If she begins to act abnormally or becomes more aggressive, call Animal Control and return to the ED for rabies vaccination.

## 2024-05-08 NOTE — ED Provider Notes (Signed)
  EMERGENCY DEPARTMENT AT [facility] Provider Note   CSN: [id number] Arrival date & time: 05/08/24  9283     Patient presents with: Animal Bite   Scott Glenn is a 53 y.o. male.   Patient is here for evaluation of dog bite.  Dog bite occurred on Friday evening.  Patient was wrestling with his son when their family dog bit his leg.  Initially he cleaned the wound with hydrogen peroxide and covered it with a Band-Aid when the bleeding stopped.  He states that it was painful on Friday evening however the pain has worsened with associated redness and swelling.  He states it is now difficult for him to bear weight on the left lower extremity and he is having difficulty sleeping due to the pain.  Patient denies any discharge of pus or any other fluids other than initial bleeding from the wound.  He took Tylenol  about 2 hours ago and notes mild improvement with this medication.  He denies any fevers, N/V/D, or abdominal pain. He denies any known allergies.  He is not aware of any allergies to antibiotics. Last Tetanus vaccine in our records was given in June 2018. (Picture of wound included in PE)  Family dog is a 37-year-old pitbull who is about 60 to 80 pounds.  He does not recall her last rabies vaccine.  They were gifted the dog as a puppy from their neighbor about 4 years ago.  He cannot recall the last time that she has been to a international aid/development worker.  He states that she stays in the house primarily and only goes outside to use the bathroom.  The history is provided by the patient.  Animal Bite Contact animal:  Dog Location:  Leg Leg injury location:  L lower leg Time since incident:  4 days Pain details:    Quality:  Aching   Severity:  Moderate   Timing:  Constant   Progression:  Worsening Incident location:  Home Animal's rabies vaccination status:  Unknown Animal in possession: yes   Relieved by:  OTC medications Worsened by:  Activity Associated symptoms:  swelling   Associated symptoms: no fever        Prior to Admission medications   Medication Sig Start Date End Date Taking? Authorizing Provider  albuterol  (PROVENTIL ) (2.5 MG/3ML) 0.083% nebulizer solution Take 2.5 mg by nebulization daily as needed for wheezing or shortness of breath.    [provider]  albuterol  (VENTOLIN  HFA) 108 (90 Base) MCG/ACT inhaler Inhale 2 puffs into the lungs every 6 (six) hours as needed for wheezing or shortness of breath. 12/29/22   Theotis Haze ORN, NP  EPINEPHrine  0.3 mg/0.3 mL IJ SOAJ injection Inject 0.3 mg into the muscle as needed for anaphylaxis. 12/29/22   Fleming, Zelda W, NP  famotidine  (PEPCID ) 20 MG tablet Take 1 tablet (20 mg total) by mouth 2 (two) times daily. 11/22/22   Fleming, Zelda W, NP  ketorolac  (TORADOL ) 10 MG tablet Take 1 tablet (10 mg total) by mouth every 6 (six) hours as needed (pain). 04/12/23   Vonna Sharlet POUR, MD  lisinopril  (ZESTRIL ) 5 MG tablet Take 1 tablet (5 mg total) by mouth daily. 08/22/22   Fleming, Zelda W, NP  tiZANidine  (ZANAFLEX ) 4 MG tablet Take 1 tablet (4 mg total) by mouth every 6 (six) hours as needed for muscle spasms. 08/02/23   Piontek, Rocky, MD    Allergies: Banana, Bee venom, and Porcine (pork) protein-containing drug products  Review of Systems  Constitutional:  Negative for fever.    Updated Vital Signs BP (!) 133/100 (BP Location: Left Arm)   Pulse 66   Temp 98.4 F (36.9 C) (Oral)   Resp 16   Ht 5' 9 (1.753 m)   Wt 77.1 kg   SpO2 100%   BMI 25.10 kg/m   Physical Exam Constitutional:      Appearance: Normal appearance.  HENT:     Head: Normocephalic and atraumatic.  Cardiovascular:     Rate and Rhythm: Normal rate and regular rhythm.     Pulses: Normal pulses.  Skin:    General: Skin is warm and dry.     Findings: Erythema present.     Comments: Dog bite to left lower extremity as pictured below. Tender on palpation. Area is warm, erythematous, and swollen. Distal PMS  intact.  Neurological:     Mental Status: He is alert and oriented to person, place, and time.      Media Information  Document Information  Photos  Left lower extremity  05/08/2024 07:54  Attached To:  Hospital Encounter on 05/08/24  Source Information  Rosina Almarie LABOR, PA-C  [facility]    (all labs ordered are listed, but only abnormal results are displayed) Labs Reviewed - No data to display  EKG: None  Radiology: No results found.   Procedures   Medications Ordered in the ED - No data to display   Patient presents to the ED for concern of dog bite, this involves an extensive number of treatment options, and is a complaint that carries with it a high risk of complications and morbidity.  The differential diagnosis includes infectious process, soft tissue damage, and rabies exposure.   Medicines ordered and prescription drug management:  I ordered medication including Toradol  for pain Reevaluation of the patient after these medicines showed that the patient improved I have reviewed the patients home medicines and have made adjustments as needed   Problem List / ED Course:  Patient is here for evaluation of dog bite.  Presentation of this dog bite is concerning for underlying infection.  Patient's last tetanus vaccine was greater than 5 years ago so he was given a dose today.  He was given Toradol  for pain.   Reevaluation:  After the interventions noted above, I reevaluated the patient and found that they have :improved   Dispostion:  After consideration of the diagnostic results and the patients response to treatment, I feel that the patent would benefit from supportive care in the home setting, antibiotics, and over-the-counter pain medication. Patient is not interested in rabies vaccination series at this time and will monitor the dog for concerning symptoms over the next 10 days.  If the dog starts showing concerning symptoms he was advised to  contact animal control and return to the ED.                                  Medical Decision Making Risk Prescription drug management.        Final diagnoses:  None    ED Discharge Orders     None          Rosina Almarie LABOR DEVONNA 05/08/24 1628    Randol Simmonds, MD 05/09/24 [phone]
